# Patient Record
Sex: Male | Born: 1945 | Race: White | Hispanic: No | Smoking: Never smoker
Health system: Southern US, Community
[De-identification: ages and names within clinical notes are randomized; demographics above are authoritative.]

## PROBLEM LIST (undated history)

## (undated) DIAGNOSIS — R011 Cardiac murmur, unspecified: Secondary | ICD-10-CM

## (undated) DIAGNOSIS — N529 Male erectile dysfunction, unspecified: Secondary | ICD-10-CM

## (undated) DIAGNOSIS — B019 Varicella without complication: Secondary | ICD-10-CM

## (undated) DIAGNOSIS — B009 Herpesviral infection, unspecified: Secondary | ICD-10-CM

## (undated) DIAGNOSIS — E785 Hyperlipidemia, unspecified: Secondary | ICD-10-CM

## (undated) HISTORY — PX: SPLENECTOMY: SUR1306

## (undated) HISTORY — DX: Varicella without complication: B01.9

## (undated) HISTORY — DX: Cardiac murmur, unspecified: R01.1

## (undated) HISTORY — PX: COLONOSCOPY: SHX174

## (undated) HISTORY — DX: Male erectile dysfunction, unspecified: N52.9

## (undated) HISTORY — DX: Hyperlipidemia, unspecified: E78.5

## (undated) HISTORY — DX: Herpesviral infection, unspecified: B00.9

---
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
## 1996-08-17 LAB — HM COLONOSCOPY

## 2005-09-10 ENCOUNTER — Emergency Department (HOSPITAL_COMMUNITY): Admission: EM | Admit: 2005-09-10 | Discharge: 2005-09-10 | Payer: Self-pay | Admitting: Emergency Medicine

## 2009-09-12 ENCOUNTER — Ambulatory Visit (HOSPITAL_COMMUNITY): Admission: RE | Admit: 2009-09-12 | Discharge: 2009-09-12 | Payer: Self-pay | Admitting: Emergency Medicine

## 2009-09-25 ENCOUNTER — Ambulatory Visit (HOSPITAL_COMMUNITY): Admission: RE | Admit: 2009-09-25 | Discharge: 2009-09-25 | Payer: Self-pay | Admitting: Emergency Medicine

## 2010-11-05 LAB — CBC
HCT: 37.8 % — ABNORMAL LOW (ref 39.0–52.0)
Hemoglobin: 12.7 g/dL — ABNORMAL LOW (ref 13.0–17.0)
Platelets: 605 10*3/uL — ABNORMAL HIGH (ref 150–400)
RDW: 13.1 % (ref 11.5–15.5)
WBC: 8.4 10*3/uL (ref 4.0–10.5)

## 2011-10-28 ENCOUNTER — Ambulatory Visit: Payer: Self-pay

## 2011-12-24 ENCOUNTER — Other Ambulatory Visit: Payer: Self-pay | Admitting: Family

## 2011-12-24 ENCOUNTER — Encounter: Payer: Self-pay | Admitting: Family

## 2011-12-24 ENCOUNTER — Ambulatory Visit (INDEPENDENT_AMBULATORY_CARE_PROVIDER_SITE_OTHER): Payer: Medicare Other | Admitting: Family

## 2011-12-24 VITALS — BP 140/80 | HR 58 | Temp 98.4°F | Ht 64.0 in | Wt 175.0 lb

## 2011-12-24 DIAGNOSIS — Z13220 Encounter for screening for lipoid disorders: Secondary | ICD-10-CM

## 2011-12-24 DIAGNOSIS — Z0000 Encounter for general adult medical examination without abnormal findings: Secondary | ICD-10-CM

## 2011-12-24 DIAGNOSIS — Z136 Encounter for screening for cardiovascular disorders: Secondary | ICD-10-CM

## 2011-12-24 DIAGNOSIS — D492 Neoplasm of unspecified behavior of bone, soft tissue, and skin: Secondary | ICD-10-CM

## 2011-12-24 DIAGNOSIS — Z23 Encounter for immunization: Secondary | ICD-10-CM

## 2011-12-24 DIAGNOSIS — Z125 Encounter for screening for malignant neoplasm of prostate: Secondary | ICD-10-CM

## 2011-12-24 LAB — CBC WITH DIFFERENTIAL/PLATELET
Basophils Relative: 0.5 % (ref 0.0–3.0)
Eosinophils Absolute: 0.2 10*3/uL (ref 0.0–0.7)
HCT: 37.7 % — ABNORMAL LOW (ref 39.0–52.0)
Hemoglobin: 12.5 g/dL — ABNORMAL LOW (ref 13.0–17.0)
Lymphocytes Relative: 46.8 % — ABNORMAL HIGH (ref 12.0–46.0)
MCHC: 33.3 g/dL (ref 30.0–36.0)
MCV: 96.4 fl (ref 78.0–100.0)
Neutro Abs: 2.7 10*3/uL (ref 1.4–7.7)
RBC: 3.91 Mil/uL — ABNORMAL LOW (ref 4.22–5.81)
WBC: 7.1 10*3/uL (ref 4.5–10.5)

## 2011-12-24 LAB — POCT URINALYSIS DIPSTICK
Leukocytes, UA: NEGATIVE
pH, UA: 5.5

## 2011-12-24 LAB — LIPID PANEL
Total CHOL/HDL Ratio: 5
Triglycerides: 236 mg/dL — ABNORMAL HIGH (ref 0.0–149.0)

## 2011-12-24 LAB — BASIC METABOLIC PANEL
BUN: 18 mg/dL (ref 6–23)
Calcium: 9 mg/dL (ref 8.4–10.5)
GFR: 69.23 mL/min (ref >60.00)
Potassium: 4.4 mEq/L (ref 3.5–5.1)

## 2011-12-24 NOTE — Patient Instructions (Signed)
Exercise to Stay Healthy  Exercise helps you become and stay healthy.    EXERCISE IDEAS AND TIPS  Choose exercises that:   You enjoy.   Fit into your day.  You do not need to exercise really hard to be healthy. You can do exercises at a slow or medium level and stay healthy. You can:   Stretch before and after working out.   Try yoga, Pilates, or tai chi.   Lift weights.   Walk fast, swim, jog, run, climb stairs, bicycle, dance, or rollerskate.   Take aerobic classes.    Exercises that burn about 150 calories:     Running 1  miles in 15 minutes.   Playing volleyball for 45 to 60 minutes.   Washing and waxing a car for 45 to 60 minutes.   Playing touch football for 45 minutes.   Walking 1  miles in 35 minutes.   Pushing a stroller 1  miles in 30 minutes.   Playing basketball for 30 minutes.   Raking leaves for 30 minutes.   Bicycling 5 miles in 30 minutes.   Walking 2 miles in 30 minutes.   Dancing for 30 minutes.   Shoveling snow for 15 minutes.   Swimming laps for 20 minutes.   Walking up stairs for 15 minutes.   Bicycling 4 miles in 15 minutes.   Gardening for 30 to 45 minutes.   Jumping rope for 15 minutes.   Washing windows or floors for 45 to 60 minutes.  Document Released: 09/05/2010 Document Revised: 07/23/2011 Document Reviewed: 09/05/2010  Exit[employer] Patient Information 2012 [employer].

## 2011-12-24 NOTE — Progress Notes (Signed)
Subjective:    Patient ID: Clifford Gould, male    DOB: 08/31/1945, 66 y.o.   MRN: [id number]  HPI Patient presents for yearly preventative medicine examination. All immunizations and health maintenance protocols were reviewed with the patient and they are up to date with these protocols. Screening laboratory values were reviewed with the patient including screening of hyperlipidemia PSA renal function and hepatic function. There medications past medical history social history problem list and allergies were reviewed in detail. Goals were established with regard to weight loss exercise diet in compliance with medications   Review of Systems  Constitutional: Negative.   HENT: Negative.   Eyes: Negative.   Respiratory: Negative.   Cardiovascular: Negative.   Gastrointestinal: Negative.   Genitourinary: Negative.   Musculoskeletal: Negative.   Skin: Negative.   Neurological: Negative.   Hematological: Negative.   Psychiatric/Behavioral: Negative.    Past Medical History  Diagnosis Date  . Heart murmur   . Chicken pox     History   Social History  . Marital Status: Married    Spouse Name: N/A    Number of Children: N/A  . Years of Education: N/A   Occupational History  . Not on file.   Social History Main Topics  . Smoking status: Never Smoker   . Smokeless tobacco: Not on file  . Alcohol Use: 1.2 oz/week    2 Cans of beer per week  . Drug Use: No  . Sexually Active:    Other Topics Concern  . Not on file   Social History Narrative  . No narrative on file    Past Surgical History  Procedure Date  . Splenectomy     Family History  Problem Relation Age of Onset  . Cancer Mother     pancreatic  . Diabetes Father     No Known Allergies  No current outpatient prescriptions on file prior to visit.    BP 140/80  Pulse 58  Temp(Src) 98.4 F (36.9 C) (Oral)  Ht 5\' 4"  (1.626 m)  Wt 175 lb (79.379 kg)  BMI 30.04 kg/m2  SpO2 98%chart     Objective:     Physical Exam  Constitutional: He is oriented to person, place, and time. He appears well-developed and well-nourished.  HENT:  Head: Normocephalic and atraumatic.  Right Ear: External ear normal.  Left Ear: External ear normal.  Nose: Nose normal.  Mouth/Throat: Oropharynx is clear and moist.  Eyes: EOM are normal.  Neck: Neck supple. No thyromegaly present.  Cardiovascular: Normal rate, regular rhythm and normal heart sounds.   Pulmonary/Chest: Effort normal and breath sounds normal. He has no wheezes. He has no rales.  Abdominal: Soft. Bowel sounds are normal. There is no tenderness. There is no rebound and no guarding.  Genitourinary: Rectum normal, prostate normal and penis normal. Guaiac negative stool. No penile tenderness.  Musculoskeletal: Normal range of motion. He exhibits no edema and no tenderness.  Neurological: He is oriented to person, place, and time. He has normal reflexes. He displays normal reflexes. No cranial nerve deficit. He exhibits normal muscle tone. Coordination normal.  Skin: Skin is warm and dry. No rash noted. No erythema. No pallor.    EKG: WNL Tetanus and Pneumovax administered      Assessment & Plan:  Assessment: Welcome to Medicare CPX  Plan: Encouraged a healthy diet and exercise. Anticipatory guidance appropriate for age to include s/s of depression, handgun safety, elimination of area rugs and other fall risk. Colonoscopy screening  performed

## 2011-12-31 ENCOUNTER — Encounter: Payer: Self-pay | Admitting: Gastroenterology

## 2012-01-29 ENCOUNTER — Encounter: Payer: Self-pay | Admitting: Gastroenterology

## 2012-01-29 ENCOUNTER — Ambulatory Visit (AMBULATORY_SURGERY_CENTER): Payer: Medicare Other

## 2012-01-29 VITALS — Ht 64.0 in | Wt 169.1 lb

## 2012-01-29 DIAGNOSIS — Z1211 Encounter for screening for malignant neoplasm of colon: Secondary | ICD-10-CM

## 2012-01-29 MED ORDER — MOVIPREP 100 G PO SOLR: 1.0000 | Freq: Once | ORAL | Status: DC

## 2012-02-12 ENCOUNTER — Encounter: Payer: Medicare Other | Admitting: Gastroenterology

## 2012-07-12 ENCOUNTER — Ambulatory Visit (INDEPENDENT_AMBULATORY_CARE_PROVIDER_SITE_OTHER): Payer: Medicare Other | Admitting: Family

## 2012-07-12 ENCOUNTER — Encounter: Payer: Self-pay | Admitting: Family

## 2012-07-12 VITALS — BP 158/84 | HR 70 | Temp 98.3°F | Wt 172.0 lb

## 2012-07-12 DIAGNOSIS — J029 Acute pharyngitis, unspecified: Secondary | ICD-10-CM

## 2012-07-12 DIAGNOSIS — J069 Acute upper respiratory infection, unspecified: Secondary | ICD-10-CM

## 2012-07-12 LAB — POCT RAPID STREP A (OFFICE): Rapid Strep A Screen: NEGATIVE

## 2012-07-12 MED ORDER — METHYLPREDNISOLONE 4 MG PO KIT: PACK | ORAL | Status: AC

## 2012-07-12 NOTE — Progress Notes (Signed)
  Subjective:    Patient ID: Clifford Gould, male    DOB: March 19, 1946, 66 y.o..o.   MRN: [id number]  HPI  66 year old white male, nonsmoker is in with complaints of sore throat, ear pain, cough, sore throat, and congestion x 1 day. Has not been taking anything for relief.   Review of Systems  Constitutional: Negative.   HENT: Positive for congestion, sore throat, sneezing and postnasal drip.   Eyes: Negative.   Respiratory: Negative.   Cardiovascular: Negative.   Musculoskeletal: Negative.   Skin: Negative.   Neurological: Negative.   Hematological: Negative.   Psychiatric/Behavioral: Negative.    Past Medical History  Diagnosis Date  . Heart murmur   . Chicken pox     History   Social History  . Marital Status: Married    Spouse Name: N/A    Number of Children: N/A  . Years of Education: N/A   Occupational History  . Not on file.   Social History Main Topics  . Smoking status: Never Smoker   . Smokeless tobacco: Not on file  . Alcohol Use: 1.2 oz/week    2 Cans of beer per week  . Drug Use: No  . Sexually Active:    Other Topics Concern  . Not on file   Social History Narrative  . No narrative on file    Past Surgical History  Procedure Date  . Splenectomy     Family History  Problem Relation Age of Onset  . Cancer Mother     pancreatic  . Diabetes Father   . Colon cancer Neg Hx     No Known Allergies  Current Outpatient Prescriptions on File Prior to Visit  Medication Sig Dispense Refill  . diphenhydramine-acetaminophen (TYLENOL PM) 25-500 MG TABS Take 1 tablet by mouth at bedtime as needed.      . Naproxen Sodium (ALEVE) 220 MG CAPS Take 1 capsule by mouth 2 (two) times daily.      . Biotin 1000 MCG tablet Take 1,000 mcg by mouth 3 (three) times daily.      Marland Kitchen MOVIPREP 100 G SOLR Take 1 kit (100 g total) by mouth once.  1 kit  0    BP 158/84  Pulse 70  Temp 98.3 F (36.8 C) (Oral)  Wt 172 lb (78.019 kg)  SpO2 95%chart    Objective:   Physical Exam  Constitutional: He is oriented to person, place, and time. He appears well-developed and well-nourished.  HENT:  Right Ear: External ear normal.  Left Ear: External ear normal.  Nose: Nose normal.  Mouth/Throat: Oropharynx is clear and moist.  Neck: Normal range of motion. Neck supple.  Cardiovascular: Normal rate, regular rhythm and normal heart sounds.   Pulmonary/Chest: Effort normal and breath sounds normal.  Musculoskeletal: Normal range of motion.  Neurological: He is alert and oriented to person, place, and time.  Skin: Skin is warm and dry.  Psychiatric: He has a normal mood and affect.          Assessment & Plan:  Assessment: Upper Resp Infection, Cough, Pharyngitis  Plan: Medrol dospak as directed. Call the office if symptoms worsen or persist. Recheck as scheduled and sooner as needed.

## 2012-07-12 NOTE — Patient Instructions (Addendum)

## 2013-01-23 ENCOUNTER — Encounter: Payer: Self-pay | Admitting: Family

## 2013-01-23 ENCOUNTER — Ambulatory Visit (INDEPENDENT_AMBULATORY_CARE_PROVIDER_SITE_OTHER): Payer: Medicare Other | Admitting: Family

## 2013-01-23 VITALS — BP 136/82 | HR 64 | Ht 64.5 in | Wt 165.0 lb

## 2013-01-23 DIAGNOSIS — Z1211 Encounter for screening for malignant neoplasm of colon: Secondary | ICD-10-CM

## 2013-01-23 DIAGNOSIS — Z125 Encounter for screening for malignant neoplasm of prostate: Secondary | ICD-10-CM

## 2013-01-23 DIAGNOSIS — Z0000 Encounter for general adult medical examination without abnormal findings: Secondary | ICD-10-CM

## 2013-01-23 LAB — CBC WITH DIFFERENTIAL/PLATELET
Eosinophils Absolute: 0.1 10*3/uL (ref 0.0–0.7)
Eosinophils Relative: 1.3 % (ref 0.0–5.0)
MCV: 97.6 fl (ref 78.0–100.0)
Monocytes Absolute: 1.1 10*3/uL — ABNORMAL HIGH (ref 0.1–1.0)
Neutrophils Relative %: 55 % (ref 43.0–77.0)
Platelets: 366 10*3/uL (ref 150.0–400.0)
WBC: 9.9 10*3/uL (ref 4.5–10.5)

## 2013-01-23 LAB — POCT URINALYSIS DIPSTICK
Bilirubin, UA: NEGATIVE
Glucose, UA: NEGATIVE
Leukocytes, UA: NEGATIVE
Nitrite, UA: NEGATIVE

## 2013-01-23 LAB — HEPATIC FUNCTION PANEL
ALT: 23 U/L (ref 0–53)
Bilirubin, Direct: 0.1 mg/dL (ref 0.0–0.3)
Total Bilirubin: 0.5 mg/dL (ref 0.3–1.2)

## 2013-01-23 LAB — BASIC METABOLIC PANEL
BUN: 17 mg/dL (ref 6–23)
Calcium: 9.1 mg/dL (ref 8.4–10.5)
GFR: 77.65 mL/min (ref >60.00)
Glucose, Bld: 96 mg/dL (ref 70–99)
Sodium: 142 mEq/L (ref 135–145)

## 2013-01-23 LAB — PSA, MEDICARE: PSA: 1.31 ng/ml (ref 0.10–4.00)

## 2013-01-23 LAB — LIPID PANEL
HDL: 34.3 mg/dL — ABNORMAL LOW (ref >39.00)
Triglycerides: 130 mg/dL (ref 0.0–149.0)

## 2013-01-23 MED ORDER — CLOTRIMAZOLE-BETAMETHASONE 1-0.05 % EX CREA: TOPICAL_CREAM | Freq: Two times a day (BID) | CUTANEOUS | Status: DC

## 2013-01-23 NOTE — Patient Instructions (Signed)
Colonoscopy  A colonoscopy is an exam to evaluate your entire colon. In this exam, your colon is cleansed. A long fiberoptic tube is inserted through your rectum and into your colon. The fiberoptic scope (endoscope) is a long bundle of enclosed and very flexible fibers. These fibers transmit light to the area examined and send images from that area to your caregiver. Discomfort is usually minimal. You may be given a drug to help you sleep (sedative) during or prior to the procedure. This exam helps to detect lumps (tumors), polyps, inflammation, and areas of bleeding. Your caregiver may also take a small piece of tissue (biopsy) that will be examined under a microscope.  LET YOUR CAREGIVER KNOW ABOUT:   · Allergies to food or medicine.  · Medicines taken, including vitamins, herbs, eyedrops, over-the-counter medicines, and creams.  · Use of steroids (by mouth or creams).  · Previous problems with anesthetics or numbing medicines.  · History of bleeding problems or blood clots.  · Previous surgery.  · Other health problems, including diabetes and kidney problems.  · Possibility of pregnancy, if this applies.  BEFORE THE PROCEDURE   · A clear liquid diet may be required for 2 days before the exam.  · Ask your caregiver about changing or stopping your regular medications.  · Liquid injections (enemas) or laxatives may be required.  · A large amount of electrolyte solution may be given to you to drink over a short period of time. This solution is used to clean out your colon.  · You should be present 60 minutes prior to your procedure or as directed by your caregiver.  AFTER THE PROCEDURE   · If you received a sedative or pain relieving medication, you will need to arrange for someone to drive you home.  · Occasionally, there is a little blood passed with the first bowel movement. Do not be concerned.  FINDING OUT THE RESULTS OF YOUR TEST  Not all test results are available during your visit. If your test results are  not back during the visit, make an appointment with your caregiver to find out the results. Do not assume everything is normal if you have not heard from your caregiver or the medical facility. It is important for you to follow up on all of your test results.  HOME CARE INSTRUCTIONS   · It is not unusual to pass moderate amounts of gas and experience mild abdominal cramping following the procedure. This is due to air being used to inflate your colon during the exam. Walking or a warm pack on your belly (abdomen) may help.  · You may resume all normal meals and activities after sedatives and medicines have worn off.  · Only take over-the-counter or prescription medicines for pain, discomfort, or fever as directed by your caregiver. Do not use aspirin or blood thinners if a biopsy was taken. Consult your caregiver for medicine usage if biopsies were taken.  SEEK IMMEDIATE MEDICAL CARE IF:   · You have a fever.  · You pass large blood clots or fill a toilet with blood following the procedure. This may also occur 10 to 14 days following the procedure. This is more likely if a biopsy was taken.  · You develop abdominal pain that keeps getting worse and cannot be relieved with medicine.  Document Released: 07/31/2000 Document Revised: 10/26/2011 Document Reviewed: 03/15/2008  Exit[employer]® Patient Information ©2014 [employer].

## 2013-01-23 NOTE — Progress Notes (Signed)
Subjective:    Patient ID: Clifford Gould, male    DOB: [birth year], 67 y.o.   MRN: [id number]  HPI Patient presents for yearly preventative medicine examination. All immunizations and health maintenance protocols were reviewed with the patient and they are up to date with these protocols. Screening laboratory values were reviewed with the patient including screening of hyperlipidemia PSA renal function and hepatic function. There medications past medical history social history problem list and allergies were reviewed in detail. Goals were established with regard to weight loss exercise diet in compliance with medications   Review of Systems  Constitutional: Negative.   HENT: Negative.   Eyes: Negative.   Respiratory: Negative.   Cardiovascular: Negative.   Gastrointestinal: Negative.   Endocrine: Negative.   Genitourinary: Negative.   Musculoskeletal: Negative.   Skin: Negative.   Allergic/Immunologic: Negative.   Neurological: Negative.   Hematological: Negative.   Psychiatric/Behavioral: Negative.    Past Medical History  Diagnosis Date  . Heart murmur   . Chicken pox     History   Social History  . Marital Status: Married    Spouse Name: N/A    Number of Children: N/A  . Years of Education: N/A   Occupational History  . Not on file.   Social History Main Topics  . Smoking status: Never Smoker   . Smokeless tobacco: Not on file  . Alcohol Use: 1.2 oz/week    2 Cans of beer per week  . Drug Use: No  . Sexually Active:    Other Topics Concern  . Not on file   Social History Narrative  . No narrative on file    Past Surgical History  Procedure Laterality Date  . Splenectomy      Family History  Problem Relation Age of Onset  . Cancer Mother     pancreatic  . Diabetes Father   . Colon cancer Neg Hx     No Known Allergies  Current Outpatient Prescriptions on File Prior to Visit  Medication Sig Dispense Refill  . diphenhydramine-acetaminophen  (TYLENOL PM) 25-500 MG TABS Take 1 tablet by mouth at bedtime as needed.      . Naproxen Sodium (ALEVE) 220 MG CAPS Take 1 capsule by mouth 2 (two) times daily.      . Biotin 1000 MCG tablet Take 1,000 mcg by mouth 3 (three) times daily.      Marland Kitchen MOVIPREP 100 G SOLR Take 1 kit (100 g total) by mouth once.  1 kit  0   No current facility-administered medications on file prior to visit.    BP 136/82  Pulse 64  Ht 5' 4.5" (1.638 m)  Wt 165 lb (74.844 kg)  BMI 27.9 kg/m2  SpO2 98%chart    Objective:   Physical Exam  Constitutional: He is oriented to person, place, and time. He appears well-developed and well-nourished.  HENT:  Head: Normocephalic.  Right Ear: External ear normal.  Left Ear: External ear normal.  Nose: Nose normal.  Mouth/Throat: Oropharynx is clear and moist.  Eyes: Conjunctivae are normal. Pupils are equal, round, and reactive to light.  Neck: Normal range of motion. Neck supple. No thyromegaly present.  Cardiovascular: Normal rate, regular rhythm and normal heart sounds.   Pulmonary/Chest: Effort normal and breath sounds normal.  Abdominal: Soft. Bowel sounds are normal. He exhibits no distension. There is no tenderness. There is no rebound.  Genitourinary: Rectum normal, prostate normal and penis normal. Guaiac negative stool. No penile tenderness.  Musculoskeletal:  Normal range of motion. He exhibits no edema and no tenderness.  Neurological: He is alert and oriented to person, place, and time. He has normal reflexes. He displays normal reflexes. No cranial nerve deficit. Coordination normal.  Skin: Skin is warm and dry.  Psychiatric: He has a normal mood and affect.          Assessment & Plan:  Assessment:  1. CPX  Plan: Encouraged a healthy diet, exercise, colonoscopy. Labs sent to include lipids, cbc, tsh, psa, UA. Recheck in 1 year and sooner as needed.

## 2013-01-24 ENCOUNTER — Encounter: Payer: Self-pay | Admitting: Gastroenterology

## 2013-03-14 ENCOUNTER — Ambulatory Visit (AMBULATORY_SURGERY_CENTER): Payer: Medicare Other | Admitting: *Deleted

## 2013-03-14 VITALS — Ht 64.5 in | Wt 174.0 lb

## 2013-03-14 DIAGNOSIS — Z1211 Encounter for screening for malignant neoplasm of colon: Secondary | ICD-10-CM

## 2013-03-14 MED ORDER — NA SULFATE-K SULFATE-MG SULF 17.5-3.13-1.6 GM/177ML PO SOLN: 1.0000 | Freq: Once | ORAL | Status: DC

## 2013-03-14 NOTE — Progress Notes (Signed)
No egg or soy allergy. ewm No home 02 use. ewm No problems with past sedation. ewm Pt had a colonoscopy 15 + years ago in [facility] [place]. Normal exam per pt. ewm

## 2013-03-15 ENCOUNTER — Encounter: Payer: Self-pay | Admitting: Gastroenterology

## 2013-03-22 ENCOUNTER — Other Ambulatory Visit: Payer: Self-pay

## 2013-03-28 ENCOUNTER — Encounter: Payer: Medicare Other | Admitting: Gastroenterology

## 2013-04-25 ENCOUNTER — Encounter: Payer: Self-pay | Admitting: Gastroenterology

## 2013-04-25 ENCOUNTER — Ambulatory Visit (AMBULATORY_SURGERY_CENTER): Payer: Medicare Other | Admitting: Gastroenterology

## 2013-04-25 VITALS — BP 122/72 | HR 55 | Temp 97.7°F | Resp 14 | Ht 64.5 in | Wt 174.0 lb

## 2013-04-25 DIAGNOSIS — Z1211 Encounter for screening for malignant neoplasm of colon: Secondary | ICD-10-CM

## 2013-04-25 MED ORDER — SODIUM CHLORIDE 0.9 % IV SOLN: 500.0000 mL | INTRAVENOUS | Status: DC

## 2013-04-25 NOTE — Op Note (Signed)
[facility] [address] [place], [address]   COLONOSCOPY PROCEDURE REPORT  PATIENT: Clifford, Gould  MR#: [id number] BIRTHDATE: May 04, 1946 , 67  yrs. old GENDER: Male ENDOSCOPIST: Louis Meckel, MD REFERRED XB:[id number] Orvan Falconer, FNP-BC PROCEDURE DATE:  04/25/2013 PROCEDURE:   Colonoscopy, diagnostic First Screening Colonoscopy - Avg.  risk and is 50 yrs.  old or older Yes.  Prior Negative Screening - Now for repeat screening. N/A  History of Adenoma - Now for follow-up colonoscopy & has been > or = to 3 yrs.  N/A ASA CLASS:   Class I INDICATIONS:average risk screening. MEDICATIONS: MAC sedation, administered by CRNA and propofol (Diprivan) 200mg  IV  DESCRIPTION OF PROCEDURE:   After the risks benefits and alternatives of the procedure were thoroughly explained, informed consent was obtained.  A digital rectal exam revealed no abnormalities of the rectum.   The LB CF-H180AL Loaner [id number] endoscope was introduced through the anus and advanced to the cecum, which was identified by both the appendix and ileocecal valve. No adverse events experienced.   The quality of the prep was excellent using Suprep  The instrument was then slowly withdrawn as the colon was fully examined.      COLON FINDINGS: A normal appearing cecum, ileocecal valve, and appendiceal orifice were identified.  The ascending, hepatic flexure, transverse, splenic flexure, descending, sigmoid colon and rectum appeared unremarkable.  No polyps or cancers were seen. Retroflexed views revealed no abnormalities. The time to cecum=2 minutes 11 seconds.  Withdrawal time=9 minutes 32 seconds.  The scope was withdrawn and the procedure completed. COMPLICATIONS: There were no complications.  ENDOSCOPIC IMPRESSION: Normal colon  RECOMMENDATIONS: Continue current colorectal screening recommendations for "routine risk" patients with a repeat colonoscopy in 10 years.   eSigned:  Louis Meckel, MD 04/25/2013 3:05 PM   cc:

## 2013-04-25 NOTE — Patient Instructions (Addendum)

## 2013-04-25 NOTE — Progress Notes (Signed)
Patient did not experience any of the following events: a burn prior to discharge; a fall within the facility; wrong site/side/patient/procedure/implant event; or a hospital transfer or hospital admission upon discharge from the facility. (G8907) Patient did not have preoperative order for IV antibiotic SSI prophylaxis. (G8918)  

## 2013-04-26 ENCOUNTER — Telehealth: Payer: Self-pay | Admitting: *Deleted

## 2013-04-26 NOTE — Telephone Encounter (Signed)
  Follow up Call-  Call back number 04/25/2013  Post procedure Call Back phone  # [phone]  Permission to leave phone message Yes     Patient questions:  Do you have a fever, pain , or abdominal swelling? no Pain Score  0 *  Have you tolerated food without any problems? yes  Have you been able to return to your normal activities? yes  Do you have any questions about your discharge instructions: Diet   no Medications  no Follow up visit  no  Do you have questions or concerns about your Care? no  Actions: * If pain score is 4 or above: No action needed, pain <4.

## 2013-06-08 ENCOUNTER — Ambulatory Visit (INDEPENDENT_AMBULATORY_CARE_PROVIDER_SITE_OTHER): Payer: Medicare Other | Admitting: Family Medicine

## 2013-06-08 VITALS — BP 132/80 | HR 61 | Temp 98.1°F | Resp 16 | Ht 64.5 in | Wt 176.4 lb

## 2013-06-08 DIAGNOSIS — J069 Acute upper respiratory infection, unspecified: Secondary | ICD-10-CM

## 2013-06-08 MED ORDER — PSEUDOEPHEDRINE HCL ER 120 MG PO TB12: 120.0000 mg | ORAL_TABLET | Freq: Two times a day (BID) | ORAL | Status: DC

## 2013-06-08 MED ORDER — IPRATROPIUM BROMIDE 0.03 % NA SOLN: 2.0000 | Freq: Four times a day (QID) | NASAL | Status: DC

## 2013-06-08 MED ORDER — GUAIFENESIN-CODEINE 100-10 MG/5ML PO SOLN: 5.0000 mL | Freq: Three times a day (TID) | ORAL | Status: DC | PRN

## 2013-06-08 MED ORDER — FLUTICASONE PROPIONATE 50 MCG/ACT NA SUSP: 2.0000 | Freq: Every day | NASAL | Status: DC

## 2013-06-08 MED ORDER — MUCINEX DM MAXIMUM STRENGTH 60-1200 MG PO TB12: 1.0000 | ORAL_TABLET | Freq: Two times a day (BID) | ORAL | Status: DC

## 2013-06-08 NOTE — Patient Instructions (Signed)
Hot showers or breathing in steam may help loosen the congestion.  Using a netti pot or sinus rinse is also likely to help you feel better and keep this from progressing.  Use the atrovent nasal spray as needed throughout the day and use the fluticasone nasal spray every night before bed for at least 2 weeks.  I recommend augmenting with 12 hr sudafed (behind the counter) and generic mucinex to help you move out the congestion.  If no improvement or you are getting worse, come back as you might need a course of steroids but hopefully with all of the above, you can avoid it.  You did get your pneumonia vaccine in May 2013 so you are done with that.  Get your flu shot this year at work when you are feeling better and whenever you have time you should definitely get your shingles vaccine (or zostavax) at the pharmacy.  Upper Respiratory Infection, Adult An upper respiratory infection (URI) is also known as the common cold. It is often caused by a type of germ (virus). Colds are easily spread (contagious). You can pass it to others by kissing, coughing, sneezing, or drinking out of the same glass. Usually, you get better in 1 or 2 weeks.  HOME CARE   Only take medicine as told by your doctor.  Use a warm mist humidifier or breathe in steam from a hot shower.  Drink enough water and fluids to keep your pee (urine) clear or pale yellow.  Get plenty of rest.  Return to work when your temperature is back to normal or as told by your doctor. You may use a face mask and wash your hands to stop your cold from spreading. GET HELP RIGHT AWAY IF:   After the first few days, you feel you are getting worse.  You have questions about your medicine.  You have chills, shortness of breath, or brown or red spit (mucus).  You have yellow or brown snot (nasal discharge) or pain in the face, especially when you bend forward.  You have a fever, puffy (swollen) neck, pain when you swallow, or white spots in the  back of your throat.  You have a bad headache, ear pain, sinus pain, or chest pain.  You have a high-pitched whistling sound when you breathe in and out (wheezing).  You have a lasting cough or cough up blood.  You have sore muscles or a stiff neck. MAKE SURE YOU:   Understand these instructions.  Will watch your condition.  Will get help right away if you are not doing well or get worse. Document Released: 01/20/2008 Document Revised: 10/26/2011 Document Reviewed: 12/08/2010 [facility] Patient Information 2014 [place], [place].

## 2013-06-22 ENCOUNTER — Other Ambulatory Visit: Payer: Self-pay

## 2013-09-17 NOTE — Progress Notes (Signed)
Subjective:    Patient ID: Clifford Gould, male    DOB: 01/12/1946, 68 y.o.   MRN: [id number]  This chart was scribed for Delman Cheadle, MD by Rolanda Lundborg, ED Scribe. This patient was seen in room 1 and the patient's care was started at 10:03 AM.  Chief Complaint  Patient presents with  . Nasal Congestion    Since sunday  . Sneezing   HPI HPI Comments: Clifford Gould is a 68 y.o. male who presents to [facility] complaining of constant nasal congestion and intermittent sneezing with green phlegm since four days ago. He is also coughing up the green phlegm. He also reports sinus pressure, clogged ears, and dry throat. He is eating and drinking normally and BMs are normal. He is sleeping well except for waking up with dry mouth. He has been using OTC alka seltzer with good relief. If he doesn't take it on a regular basis he gets pretty fatigued. He denies jaw pain, fevers, chills, trouble swallowing, nausea, vomiting.  Past Medical History  Diagnosis Date  . Heart murmur   . Chicken pox   . Hyperlipidemia     last year 2013 elevated now normal   Current Outpatient Prescriptions on File Prior to Visit  Medication Sig Dispense Refill  . acetaminophen (TYLENOL) 500 MG tablet Take 500 mg by mouth every 6 (six) hours as needed for pain.      Marland Kitchen aspirin 325 MG tablet Take 325 mg by mouth daily.      . clotrimazole-betamethasone (LOTRISONE) cream Apply topically 2 (two) times daily.  30 g  0  . diphenhydramine-acetaminophen (TYLENOL PM) 25-500 MG TABS Take 1 tablet by mouth at bedtime as needed.      Marland Kitchen L-Lysine HCl 500 MG CAPS Take 1 capsule by mouth daily.      . Multiple Vitamins-Minerals (CENTRUM SILVER ADULT 50+ PO) Take 1 capsule by mouth daily.      . Potassium Gluconate 595 MG CAPS Take 1 capsule by mouth daily.      Marland Kitchen glucosamine-chondroitin 500-400 MG tablet Take 1 tablet by mouth 3 (three) times daily.      . Naproxen Sodium (ALEVE) 220 MG CAPS Take 1 capsule by mouth 2 (two) times daily.        No current facility-administered medications on file prior to visit.   No Known Allergies   Review of Systems  Constitutional: Negative for fever and chills.  HENT: Positive for congestion, ear pain, sinus pressure, sneezing and sore throat. Negative for rhinorrhea and trouble swallowing.   Eyes: Negative for visual disturbance.  Respiratory: Positive for cough. Negative for shortness of breath.   Cardiovascular: Negative for chest pain and leg swelling.  Gastrointestinal: Negative for nausea, vomiting, abdominal pain and diarrhea.  Genitourinary: Negative for dysuria.  Musculoskeletal: Negative for back pain and neck pain.  Skin: Negative for rash.  Neurological: Negative for headaches.  Hematological: Does not bruise/bleed easily.  Psychiatric/Behavioral: Negative for confusion.  All other systems reviewed and are negative.      BP 132/80  Pulse 61  Temp(Src) 98.1 F (36.7 C) (Oral)  Resp 16  Ht 5' 4.5" (1.638 m)  Wt 176 lb 6.4 oz (80.015 kg)  BMI 29.82 kg/m2  SpO2 96% Objective:   Physical Exam  Nursing note and vitals reviewed. Constitutional: He is oriented to person, place, and time. He appears well-developed and well-nourished. No distress.  HENT:  Head: Normocephalic and atraumatic.  Left Ear: A middle ear effusion is  present.  Nose: No mucosal edema or rhinorrhea.  Mouth/Throat: Posterior oropharyngeal erythema present. No oropharyngeal exudate or posterior oropharyngeal edema.  Eyes: EOM are normal.  Neck: Neck supple. No tracheal deviation present. No mass and no thyromegaly present.  Cardiovascular: Normal rate.   Pulmonary/Chest: Effort normal and breath sounds normal. No respiratory distress. He has no decreased breath sounds. He has no wheezes. He has no rhonchi. He has no rales.  Musculoskeletal: Normal range of motion.  Lymphadenopathy:       Head (right side): Tonsillar adenopathy present.       Head (left side): Tonsillar adenopathy present.     He has no cervical adenopathy.       Right cervical: No superficial cervical, no deep cervical and no posterior cervical adenopathy present.      Left cervical: No superficial cervical, no deep cervical and no posterior cervical adenopathy present.  Neurological: He is alert and oriented to person, place, and time.  Skin: Skin is warm and dry.  Psychiatric: He has a normal mood and affect. His behavior is normal.      Assessment & Plan:  10:12 AM- Discussed treatment plan with pt which includes discharge home with sudafed, flonase, and atrovent and to come back if symptoms worsen. Pt agrees to plan. URI, acute  Meds ordered this encounter  Medications  . guaiFENesin-codeine 100-10 MG/5ML syrup    Sig: Take 5 mLs by mouth 3 (three) times daily as needed for cough or congestion.    Dispense:  120 mL    Refill:  0  . ipratropium (ATROVENT) 0.03 % nasal spray    Sig: Place 2 sprays into the nose 4 (four) times daily.    Dispense:  30 mL    Refill:  5  . fluticasone (FLONASE) 50 MCG/ACT nasal spray    Sig: Place 2 sprays into the nose at bedtime.    Dispense:  16 g    Refill:  6  . Dextromethorphan-Guaifenesin (MUCINEX DM MAXIMUM STRENGTH) 60-1200 MG TB12    Sig: Take 1 tablet by mouth every 12 (twelve) hours.    Dispense:  60 each    Refill:  1  . pseudoephedrine (SUDAFED 12 HOUR) 120 MG 12 hr tablet    Sig: Take 1 tablet (120 mg total) by mouth every 12 (twelve) hours.    Dispense:  30 tablet    Refill:  0    I personally performed the services described in this documentation, which was scribed in my presence. The recorded information has been reviewed and considered, and addended by me as needed.  Delman Cheadle, MD MPH

## 2013-09-29 ENCOUNTER — Encounter: Payer: Self-pay | Admitting: Family

## 2013-09-29 ENCOUNTER — Ambulatory Visit (INDEPENDENT_AMBULATORY_CARE_PROVIDER_SITE_OTHER): Payer: Medicare Other | Admitting: Family

## 2013-09-29 VITALS — BP 120/80 | HR 69 | Wt 173.0 lb

## 2013-09-29 DIAGNOSIS — N529 Male erectile dysfunction, unspecified: Secondary | ICD-10-CM

## 2013-09-29 DIAGNOSIS — R5383 Other fatigue: Secondary | ICD-10-CM

## 2013-09-29 DIAGNOSIS — Z0100 Encounter for examination of eyes and vision without abnormal findings: Secondary | ICD-10-CM

## 2013-09-29 DIAGNOSIS — R3915 Urgency of urination: Secondary | ICD-10-CM

## 2013-09-29 DIAGNOSIS — R5381 Other malaise: Secondary | ICD-10-CM

## 2013-09-29 LAB — BASIC METABOLIC PANEL
BUN: 25 mg/dL — ABNORMAL HIGH (ref 6–23)
CO2: 24 mEq/L (ref 19–32)
Calcium: 9.8 mg/dL (ref 8.4–10.5)
Chloride: 104 mEq/L (ref 96–112)
Creatinine, Ser: 1.3 mg/dL (ref 0.4–1.5)
GFR: 58.57 mL/min — AB (ref >60.00)
GLUCOSE: 87 mg/dL (ref 70–99)
POTASSIUM: 4.1 meq/L (ref 3.5–5.1)
Sodium: 138 mEq/L (ref 135–145)

## 2013-09-29 LAB — POCT URINALYSIS DIPSTICK
Bilirubin, UA: NEGATIVE
GLUCOSE UA: NEGATIVE
Ketones, UA: NEGATIVE
Leukocytes, UA: NEGATIVE
NITRITE UA: NEGATIVE
Protein, UA: NEGATIVE
RBC UA: NEGATIVE
Spec Grav, UA: 1.02
Urobilinogen, UA: 0.2
pH, UA: 5.5

## 2013-09-29 LAB — TESTOSTERONE: TESTOSTERONE: 283.29 ng/dL — AB (ref 350.00–890.00)

## 2013-09-29 LAB — TSH: TSH: 2.36 u[IU]/mL (ref 0.35–5.50)

## 2013-09-29 NOTE — Progress Notes (Signed)
Pre visit review using our clinic review tool, if applicable. No additional management support is needed unless otherwise documented below in the visit note. 

## 2013-09-29 NOTE — Patient Instructions (Signed)
Erectile Dysfunction  Erectile dysfunction is the inability to get or sustain a good enough erection to have sexual intercourse. Erectile dysfunction may involve:   Inability to get an erection.   Lack of enough hardness to allow penetration.   Loss of the erection before sex is finished.   Premature ejaculation.  CAUSES   Certain drugs, such as:   Pain relievers.   Antihistamines.   Antidepressants.   Blood pressure medicines.   Water pills (diuretics).   Ulcer medicines.   Muscle relaxants.   Illegal drugs.   Excessive drinking.   Psychological causes, such as:   Anxiety.   Depression.   Sadness.   Exhaustion.   Performance fear.   Stress.   Physical causes, such as:   Artery problems. This may include diabetes, smoking, liver disease, or atherosclerosis.   High blood pressure.   Hormonal problems, such as low testosterone.   Obesity.   Nerve problems. This may include back or pelvic injuries, diabetes mellitus, multiple sclerosis, or Parkinson disease.  SYMPTOMS   Inability to get an erection.   Lack of enough hardness to allow penetration.   Loss of the erection before sex is finished.   Premature ejaculation.   Normal erections at some times, but with frequent unsatisfactory episodes.   Orgasms that are not satisfactory in sensation or frequency.   Low sexual satisfaction in either partner because of erection problems.   A curved penis occurring with erection. The curve may cause pain or may be too curved to allow for intercourse.   Never having nighttime erections.  DIAGNOSIS  Your caregiver can often diagnose this condition by:   Performing a physical exam to find other diseases or specific problems with the penis.   Asking you detailed questions about the problem.   Performing blood tests to check for diabetes mellitus or to measure hormone levels.   Performing urine tests to find other underlying health conditions.   Performing an ultrasound exam to check for  scarring.   Performing a test to check blood flow to the penis.   Doing a sleep study at home to measure nighttime erections.  TREATMENT    You may be prescribed medicines by mouth.   You may be given medicine injections into the penis.   You may be prescribed a vacuum pump with a ring.   Penile implant surgery may be performed. You may receive:   An inflatable implant.   A semirigid implant.   Blood vessel surgery may be performed.  HOME CARE INSTRUCTIONS   If you are prescribed oral medicine, you should take the medicine as prescribed. Do not increase the dosage without first discussing it with your physician.   If you are using self-injections, be careful to avoid any veins that are on the surface of the penis. Apply pressure to the injection site for 5 minutes.   If you are using a vacuum pump, make sure you have read the instructions before using it. Discuss any questions with your physician before taking the pump home.  SEEK MEDICAL CARE IF:   You experience pain that is not responsive to the pain medicine you have been prescribed.   You experience nausea or vomiting.  SEEK IMMEDIATE MEDICAL CARE IF:    When taking oral or injectable medications, you experience an erection that lasts longer than 4 hours. If your physician is unavailable, go to the nearest emergency room for evaluation. An erection that lasts much longer than 4 hours can   result in permanent damage to your penis.   You have pain that is severe.   You develop redness, severe pain, or severe swelling of your penis.   You have redness spreading up into your groin or lower abdomen.   You are unable to pass your urine.  Document Released: 07/31/2000 Document Revised: 04/05/2013 Document Reviewed: 01/05/2013  [employer] Patient Information 2014 [employer].

## 2013-09-29 NOTE — Progress Notes (Signed)
Subjective:    Patient ID: Clifford Gould, male    DOB: Feb 06, 1946, 68 y.o.   MRN: [id number]  HPI 68 year old white male, nonsmoker is in today with concerns of erectile dysfunction over the last 2 months. Reports difficulty achieving and maintaining an erection. Also reports decreased urinary flow and increased urgency. Has never had any issues prior to now. Reports increased fatigue and has to take a daily nap of about 15-20 minutes prior to going into work the night shift. Reports having occasional low back pain and numbness going down the right leg. Denies any injury. The pain is worse when going up stairs.   Review of Systems  Constitutional: Positive for fatigue.  Respiratory: Negative.   Cardiovascular: Negative.   Gastrointestinal: Negative.   Genitourinary: Positive for urgency and frequency.       Erectile dysfunction  Musculoskeletal: Positive for arthralgias and back pain.  Skin: Negative.   Neurological: Negative.   Psychiatric/Behavioral: Negative.    Past Medical History  Diagnosis Date  . Heart murmur   . Chicken pox   . Hyperlipidemia     last year 2013 elevated now normal    History   Social History  . Marital Status: Married    Spouse Name: N/A    Number of Children: N/A  . Years of Education: N/A   Occupational History  . Not on file.   Social History Main Topics  . Smoking status: Never Smoker   . Smokeless tobacco: Never Used  . Alcohol Use: 1.2 oz/week    2 Cans of beer per week     Comment: occ beer  . Drug Use: No  . Sexual Activity: Not on file   Other Topics Concern  . Not on file   Social History Narrative  . No narrative on file    Past Surgical History  Procedure Laterality Date  . Splenectomy    . Colonoscopy      15+yrs ago- normal per pt.    Family History  Problem Relation Age of Onset  . Pancreatic cancer Mother     pancreatic  . Diabetes Father   . Colon cancer Neg Hx     No Known Allergies  Current  Outpatient Prescriptions on File Prior to Visit  Medication Sig Dispense Refill  . diphenhydramine-acetaminophen (TYLENOL PM) 25-500 MG TABS Take 1 tablet by mouth at bedtime as needed.      . Potassium Gluconate 595 MG CAPS Take 1 capsule by mouth daily.      Marland Kitchen acetaminophen (TYLENOL) 500 MG tablet Take 500 mg by mouth every 6 (six) hours as needed for pain.      Marland Kitchen aspirin 325 MG tablet Take 325 mg by mouth daily.      . clotrimazole-betamethasone (LOTRISONE) cream Apply topically 2 (two) times daily.  30 g  0  . Dextromethorphan-Guaifenesin (MUCINEX DM MAXIMUM STRENGTH) 60-1200 MG TB12 Take 1 tablet by mouth every 12 (twelve) hours.  60 each  1  . fluticasone (FLONASE) 50 MCG/ACT nasal spray Place 2 sprays into the nose at bedtime.  16 g  6  . glucosamine-chondroitin 500-400 MG tablet Take 1 tablet by mouth 3 (three) times daily.      Marland Kitchen guaiFENesin-codeine 100-10 MG/5ML syrup Take 5 mLs by mouth 3 (three) times daily as needed for cough or congestion.  120 mL  0  . ipratropium (ATROVENT) 0.03 % nasal spray Place 2 sprays into the nose 4 (four) times daily.  30 mL  5  . L-Lysine HCl 500 MG CAPS Take 1 capsule by mouth daily.      . Multiple Vitamins-Minerals (CENTRUM SILVER ADULT 50+ PO) Take 1 capsule by mouth daily.      . Naproxen Sodium (ALEVE) 220 MG CAPS Take 1 capsule by mouth 2 (two) times daily.      . pseudoephedrine (SUDAFED 12 HOUR) 120 MG 12 hr tablet Take 1 tablet (120 mg total) by mouth every 12 (twelve) hours.  30 tablet  0   No current facility-administered medications on file prior to visit.    BP 120/80  Pulse 69  Wt 173 lb (78.472 kg)chart    Objective:   Physical Exam  Constitutional: He is oriented to person, place, and time. He appears well-developed and well-nourished.  Neck: Normal range of motion. Neck supple.  Cardiovascular: Normal rate, regular rhythm and normal heart sounds.   Pulmonary/Chest: Effort normal and breath sounds normal.  Abdominal: Soft. Bowel  sounds are normal.  Musculoskeletal: Normal range of motion.  Neurological: He is alert and oriented to person, place, and time.  Skin: Skin is warm and dry.  Psychiatric: He has a normal mood and affect.          Assessment & Plan:  Clifford Gould was seen today for hip pain, erectile dysfunction and knee pain.  Diagnoses and associated orders for this visit:  Erectile dysfunction - Basic Metabolic Panel - POC Urinalysis Dipstick - Testosterone - TSH  Urinary urgency - Basic Metabolic Panel - POC Urinalysis Dipstick - Testosterone - TSH  Other malaise and fatigue - Basic Metabolic Panel - POC Urinalysis Dipstick - Testosterone - TSH   Labs sent, will notify patient pending results and discuss further treatment plan.

## 2013-10-02 ENCOUNTER — Other Ambulatory Visit: Payer: Self-pay | Admitting: Family

## 2013-10-02 DIAGNOSIS — E291 Testicular hypofunction: Secondary | ICD-10-CM

## 2014-01-29 ENCOUNTER — Ambulatory Visit (INDEPENDENT_AMBULATORY_CARE_PROVIDER_SITE_OTHER): Payer: Medicare Other | Admitting: Family Medicine

## 2014-01-29 ENCOUNTER — Encounter: Payer: Self-pay | Admitting: Family Medicine

## 2014-01-29 ENCOUNTER — Ambulatory Visit (INDEPENDENT_AMBULATORY_CARE_PROVIDER_SITE_OTHER): Payer: Medicare Other

## 2014-01-29 VITALS — BP 134/62 | HR 52 | Temp 97.9°F | Resp 16 | Ht 64.0 in | Wt 171.0 lb

## 2014-01-29 DIAGNOSIS — M79671 Pain in right foot: Secondary | ICD-10-CM

## 2014-01-29 DIAGNOSIS — M79604 Pain in right leg: Secondary | ICD-10-CM

## 2014-01-29 DIAGNOSIS — Y92009 Unspecified place in unspecified non-institutional (private) residence as the place of occurrence of the external cause: Secondary | ICD-10-CM

## 2014-01-29 DIAGNOSIS — M79609 Pain in unspecified limb: Secondary | ICD-10-CM

## 2014-01-29 DIAGNOSIS — IMO0002 Reserved for concepts with insufficient information to code with codable children: Secondary | ICD-10-CM

## 2014-01-29 DIAGNOSIS — W19XXXA Unspecified fall, initial encounter: Secondary | ICD-10-CM

## 2014-01-29 DIAGNOSIS — S8010XA Contusion of unspecified lower leg, initial encounter: Secondary | ICD-10-CM

## 2014-01-29 DIAGNOSIS — S80811A Abrasion, right lower leg, initial encounter: Secondary | ICD-10-CM

## 2014-01-29 MED ORDER — HYDROCODONE-ACETAMINOPHEN 5-325 MG PO TABS: 1.0000 | ORAL_TABLET | Freq: Four times a day (QID) | ORAL | Status: DC | PRN

## 2014-01-29 NOTE — Patient Instructions (Signed)
Use ice 15 or 20 minutes every few hours  Elevate  Return if worse or not improving  Ibuprofen or acetaminophen or naproxen as needed for pain  Hydrocodone one every 4-6 hours as needed for more severe pain

## 2014-01-29 NOTE — Progress Notes (Signed)
Subjective: 68 year old man who was doing some work around his house and put on a ladder on a table to try and get up onto a roof. When he was about 8 feet things gave way and he flipped onto the ground. It was probably a brick patio he landed on, though he is not sure exactly how he landed. He lay there for a brief period and got himself up. He felt transiently dizzy but that cleared up after about 5 minutes. He got himself up and got to a chair. Later he lay down on the couch with ice on his leg. When he awakened from a little nap he had numbness in his right foot. He's having a good deal of pain in his lower legs. He has abrasions on both legs. He walked himself in here. He has daily headaches when he gets up in the morning. No specific problems with his legs.  Generally he is a healthy man. Retired Quarry manager at [employer].  Objective: Alert gentleman who is in some distress just from the pain. He is hurting in his legs and especially the right ankle and foot. He has abrasions on the lateral aspect of the right leg just below the knee and the largest abrasions on the lower third of the leg. The right leg is a little swollen compared to the left. He is diffusely tender from mid shin down, especially on the lateral aspect of the lower leg and on the top of his foot. He is able to move his toes. Pulses intact. The left foot has lateral abrasions on it and does not seem nearly as tender it down in the foot though he has some tenderness in the calf on the left. color is good.  Assessment: Pain both legs from falls, especially right  Plan: X-ray right lower extremity and foot  [facility] reading (PRIMARY) by  Dr. Linna Darner No fractures noted  Symptomatic treatment.

## 2014-02-02 ENCOUNTER — Ambulatory Visit (INDEPENDENT_AMBULATORY_CARE_PROVIDER_SITE_OTHER): Payer: Medicare Other | Admitting: Family Medicine

## 2014-02-02 VITALS — BP 140/86 | HR 53 | Temp 98.5°F | Resp 20 | Ht 64.0 in | Wt 178.0 lb

## 2014-02-02 DIAGNOSIS — M79609 Pain in unspecified limb: Secondary | ICD-10-CM

## 2014-02-02 DIAGNOSIS — M79671 Pain in right foot: Secondary | ICD-10-CM

## 2014-02-02 DIAGNOSIS — S8011XD Contusion of right lower leg, subsequent encounter: Secondary | ICD-10-CM

## 2014-02-02 DIAGNOSIS — S8010XA Contusion of unspecified lower leg, initial encounter: Secondary | ICD-10-CM

## 2014-02-02 DIAGNOSIS — Z5189 Encounter for other specified aftercare: Secondary | ICD-10-CM

## 2014-02-02 DIAGNOSIS — M79604 Pain in right leg: Secondary | ICD-10-CM

## 2014-02-02 MED ORDER — HYDROCODONE-ACETAMINOPHEN 5-325 MG PO TABS: 1.0000 | ORAL_TABLET | Freq: Four times a day (QID) | ORAL | Status: DC | PRN

## 2014-02-02 NOTE — Patient Instructions (Signed)
Take Aleve 2 tablets twice daily as needed for pain and inflammation  When sitting around to elevate the leg. Walking is probably good for it.  Use the hydrocodone every 6 hours if worse pain. Can take it at 4 hours if needed at nighttime for worse pain.  Return if needed

## 2014-02-02 NOTE — Progress Notes (Signed)
Subjective: Patient is doing better but it still hurts in the right leg foot. He says he wakes up early morning hours of the night hurting. He tried taking some of his wife's gabapentin and that seemed to give him relief. He has taken some ibuprofen also. He developed more bruising in the right pocket area of his thigh from where he had a cell phone. He has a pins and needles sensation in his right foot where it is swollen and turned bluish. The whole leg however is not as swollen as it was yesterday.  Objective: Large ecchymosis right lateral thigh. Mildly bluish discoloration of the lower leg especially the right foot. The multiple small abrasions or healing nicely.  Assessment: Right leg contusion and abrasions and ecchymosis and pain  Plan: See instructions. Represcribed the hydrocodone/APAP

## 2014-02-22 ENCOUNTER — Ambulatory Visit (INDEPENDENT_AMBULATORY_CARE_PROVIDER_SITE_OTHER): Payer: Medicare Other | Admitting: Family Medicine

## 2014-02-22 VITALS — BP 128/70 | HR 52 | Temp 98.7°F | Resp 16 | Ht 65.0 in | Wt 177.6 lb

## 2014-02-22 DIAGNOSIS — L03115 Cellulitis of right lower limb: Secondary | ICD-10-CM

## 2014-02-22 DIAGNOSIS — L02419 Cutaneous abscess of limb, unspecified: Secondary | ICD-10-CM

## 2014-02-22 DIAGNOSIS — L03119 Cellulitis of unspecified part of limb: Secondary | ICD-10-CM

## 2014-02-22 MED ORDER — SILVER SULFADIAZINE 1 % EX CREA: 1.0000 "application " | TOPICAL_CREAM | Freq: Every day | CUTANEOUS | Status: DC

## 2014-02-22 MED ORDER — CEPHALEXIN 500 MG PO CAPS
500.0000 mg | ORAL_CAPSULE | Freq: Three times a day (TID) | ORAL | Status: DC
Start: 2014-02-22 — End: 2014-04-16

## 2014-02-22 NOTE — Progress Notes (Signed)
[facility] [address], [place] [address] [phone]  Date:  02/22/2014   Name:  Clifford Gould.   DOB:  01/02/1946   MRN:  [id number]  PCP:  Donia Ast, FNP    Chief Complaint: Leg Injury   History of Present Illness:  Clifford Sagan. is a 68 y.o. very pleasant male patient who presents with the following:  About one month ago he fell and hurt his right leg- he fell from a ladder that was on top of a picnic table on 6/15- he landed on a brick patio.  He scraped up his right leg.  He had x-rays and no fracture.  His leg is generally better, but he notes that one area just does not want to finish healing.  It is not painful, but he has noted some redness and possible pus formation around the area over the last couple of weeks.  He is afraid it is getting infected.   He is otherwise generally healthy  No other complaint today  He is a part time CMA at [employer]  He is NOT diabetic  There are no active problems to display for this patient.   Past Medical History  Diagnosis Date  . Heart murmur   . Chicken pox   . Hyperlipidemia     last year 2013 elevated now normal    Past Surgical History  Procedure Laterality Date  . Splenectomy    . Colonoscopy      15+yrs ago- normal per pt.    History  Substance Use Topics  . Smoking status: Never Smoker   . Smokeless tobacco: Never Used  . Alcohol Use: 1.2 oz/week    2 Cans of beer per week     Comment: occ beer    Family History  Problem Relation Age of Onset  . Pancreatic cancer Mother     pancreatic  . Diabetes Father   . Colon cancer Neg Hx     No Known Allergies  Medication list has been reviewed and updated.  Current Outpatient Prescriptions on File Prior to Visit  Medication Sig Dispense Refill  . acetaminophen (TYLENOL) 500 MG tablet Take 500 mg by mouth every 6 (six) hours as needed for pain.      . diphenhydramine-acetaminophen (TYLENOL PM) 25-500 MG TABS  Take 1 tablet by mouth at bedtime as needed.      . Potassium Gluconate 595 MG CAPS Take 1 capsule by mouth daily.      Marland Kitchen HYDROcodone-acetaminophen (NORCO) 5-325 MG per tablet Take 1 tablet by mouth every 6 (six) hours as needed.  20 tablet  0   No current facility-administered medications on file prior to visit.    Review of Systems:  As per HPI- otherwise negative.   Physical Examination: Filed Vitals:   02/22/14 0845  BP: 128/70  Pulse: 52  Temp: 98.7 F (37.1 C)  Resp: 16   Filed Vitals:   02/22/14 0845  Height: 5\' 5"  (1.651 m)  Weight: 177 lb 9.6 oz (80.559 kg)   Body mass index is 29.55 kg/(m^2). Ideal Body Weight: Weight in (lb) to have BMI = 25: 149.9  GEN: WDWN, NAD, Non-toxic, A & O x 3, looks well HEENT: Atraumatic, Normocephalic. Neck supple. No masses, No LAD. Ears and Nose: No external deformity. CV: RRR, No M/G/R. No JVD. No thrill. No extra heart sounds. PULM: CTA B, no wheezes, crackles, rhonchi. No retractions. No resp. distress.  No accessory muscle use. EXTR: No c/c/e NEURO Normal gait.  PSYCH: Normally interactive. Conversant. Not depressed or anxious appearing.  Calm demeanor.  Right leg: there is a nickel sized shallow ulcer on the lateal right shin, distal. Surrounded by a small area of redness and minimal swelling consistent with mild cellulitis.  Debrided and dressed with silvadene.    Assessment and Plan: Cellulitis of right leg - Plan: cephALEXin (KEFLEX) 500 MG capsule, silver sulfADIAZINE (SILVADENE) 1 % cream  cellulitis following injury and slow healing of leg wound.  Instructed him to clean and dress twice a day with silvadene, and take keflex.  He will let me know if not better in the next few days- Sooner if worse.   See patient instructions for more details.     Signed Lamar Blinks, MD

## 2014-02-22 NOTE — Patient Instructions (Signed)
Take the keflex antibioitc three times a day.  Change your dressing and apply silvadene twice a day.  Keep the area covered unless it seems too moist, in which case leave it open to the air for a time.  I think after 5-6 days of wrapping it will be doing better and can be left open.  Let me know if your leg is not healing well

## 2014-03-01 ENCOUNTER — Encounter: Payer: Self-pay | Admitting: Physician Assistant

## 2014-03-01 ENCOUNTER — Ambulatory Visit (INDEPENDENT_AMBULATORY_CARE_PROVIDER_SITE_OTHER): Payer: Medicare Other | Admitting: Physician Assistant

## 2014-03-01 VITALS — BP 154/78 | HR 66 | Temp 98.7°F | Resp 18 | Wt 176.0 lb

## 2014-03-01 DIAGNOSIS — L03115 Cellulitis of right lower limb: Secondary | ICD-10-CM

## 2014-03-01 DIAGNOSIS — L03119 Cellulitis of unspecified part of limb: Secondary | ICD-10-CM

## 2014-03-01 DIAGNOSIS — L02419 Cutaneous abscess of limb, unspecified: Secondary | ICD-10-CM

## 2014-03-01 NOTE — Progress Notes (Signed)
Subjective:    Patient ID: Clifford Gould., male    DOB: 1946-04-27, 68 y.o.   MRN: [id number]  HPI Patient is a 68 y.o. male presenting for non-healing wound. Pt had fallen and injured right lower leg about a month ago, xrays were normal, symptomatic care was started, and pain management. Pt has seen a physician at [facility] last week because the wound was still not completely healed. The area was cleaned, and silvadene was applied with instructions to change the dressing twice daily with silvadene cream, and Rx for kelfex TID. He presents today stating that it still doesn't seem to be healing. Pt states that it is not really painful. He states that it is still numb feeling. He states that the swelling seems to have gone down some. He feels that it is not really draining much. Currently, his wound care is twice daily silvadene cream, and washing it with an antiseptic wash. He is still taking keflex, on his last day. He denies fevers, chills, nausea, vomiting, diarrhea, SOB, chest pain, headache, syncope.    Review of Systems As per HPI and are otherwise negative.   Past Medical History  Diagnosis Date  . Heart murmur   . Chicken pox   . Hyperlipidemia     last year 2013 elevated now normal    History   Social History  . Marital Status: Married    Spouse Name: N/A    Number of Children: N/A  . Years of Education: N/A   Occupational History  . Not on file.   Social History Main Topics  . Smoking status: Never Smoker   . Smokeless tobacco: Never Used  . Alcohol Use: 1.2 oz/week    2 Cans of beer per week     Comment: occ beer  . Drug Use: No  . Sexual Activity: Not on file   Other Topics Concern  . Not on file   Social History Narrative  . No narrative on file    Past Surgical History  Procedure Laterality Date  . Splenectomy    . Colonoscopy      15+yrs ago- normal per pt.    Family History  Problem Relation Age of Onset  . Pancreatic cancer Mother    pancreatic  . Diabetes Father   . Colon cancer Neg Hx     No Known Allergies  Current Outpatient Prescriptions on File Prior to Visit  Medication Sig Dispense Refill  . acetaminophen (TYLENOL) 500 MG tablet Take 500 mg by mouth every 6 (six) hours as needed for pain.      . cephALEXin (KEFLEX) 500 MG capsule Take 1 capsule (500 mg total) by mouth 3 (three) times daily.  21 capsule  0  . diphenhydramine-acetaminophen (TYLENOL PM) 25-500 MG TABS Take 1 tablet by mouth at bedtime as needed.      Marland Kitchen HYDROcodone-acetaminophen (NORCO) 5-325 MG per tablet Take 1 tablet by mouth every 6 (six) hours as needed.  20 tablet  0  . Potassium Gluconate 595 MG CAPS Take 1 capsule by mouth daily.      . silver sulfADIAZINE (SILVADENE) 1 % cream Apply 1 application topically daily.  20 g  0   No current facility-administered medications on file prior to visit.    EXAM: BP 154/78  Pulse 66  Temp(Src) 98.7 F (37.1 C) (Oral)  Resp 18  Wt 176 lb (79.833 kg)     Objective:   Physical Exam  Nursing note and vitals  reviewed. Constitutional: He is oriented to person, place, and time. He appears well-developed and well-nourished. No distress.  HENT:  Head: Normocephalic and atraumatic.  Eyes: Conjunctivae and EOM are normal. Pupils are equal, round, and reactive to light.  Neck: Normal range of motion.  Cardiovascular: Normal rate and regular rhythm.   Distal pulses on left are normal. Distal pulses on right are decreased, faint.  Pulmonary/Chest: Effort normal and breath sounds normal. No respiratory distress. He exhibits no tenderness.  Musculoskeletal: Normal range of motion. He exhibits no edema and no tenderness.  Neurological: He is alert and oriented to person, place, and time.  Skin: Skin is warm and dry. No rash noted. He is not diaphoretic. No erythema. No pallor.  2 cm round ulceration on the anterior right lower leg, this does not have any significant surrounding erythema, no TTP, no  warmth, swelling, or fluctuance.   Psychiatric: He has a normal mood and affect. His behavior is normal. Judgment and thought content normal.     Lab Results  Component Value Date   WBC 9.9 01/23/2013   HGB 13.6 01/23/2013   HCT 40.6 01/23/2013   PLT 366.0 01/23/2013   GLUCOSE 87 09/29/2013   CHOL 184 01/23/2013   TRIG 130.0 01/23/2013   HDL 34.30* 01/23/2013   LDLDIRECT 103.8 12/24/2011   LDLCALC 124* 01/23/2013   ALT 23 01/23/2013   AST 26 01/23/2013   NA 138 09/29/2013   K 4.1 09/29/2013   CL 104 09/29/2013   CREATININE 1.3 09/29/2013   BUN 25* 09/29/2013   CO2 24 09/29/2013   TSH 2.36 09/29/2013   PSA 1.31 01/23/2013   INR 0.89 09/25/2009        Assessment & Plan:  Irene was seen today for nonhealing wound.  Diagnoses and associated orders for this visit:  Cellulitis of right leg Comments: Non-healing wound. Will refer to wound clinic and schedule arterial doppler of right leg. Continue dressing BID with slivadene, and complete Keflex course. - Ambulatory referral to Wound Clinic - Lower Extremity Arterial Doppler Right; Future    No longer appears cellulitic, Keflex seems to have been effective, will have pt finish out course. Wound still failing to heal, not worsening. Difficult to palpate right lower extremity pulses, hard to determine significance to delayed healing, will order right LE arterial doppler. Continue current wound care, and have pt see wound clinic for evaluation and care.  Pts elevated BP of 154/78 today. Pt states that his BP is normally elevated at Dr. Thomasene Lot, however he checks at home and is normotensive 120s/70s. Instructed pt to keep a dedicated record of home BPs and bring this to PCP in 1 month for follow up to determine need for HTN treatment.  Return precautions provided, and patient handout on cellulitis and HTN.  Plan to follow up in 1 month with PCP to reassess, or for worsening or persistent symptoms despite treatment.  Patient Instructions  We will refer  you to the wound clinic to have your wound evaluated properly.  Due to the possibility for arterial insufficiency possibly contributing to the delayed healing, we have ordered a lower extremity arterial doppler.   Continue monitoring your home blood pressures. Return in about 1 month to see your PCP with a written log of your home BPs to evaluate your need to restart hypertension medication.  If emergency symptoms discussed during visit developed, seek medical attention immediately.  Followup in about 1 month with PCP for BPs, or for worsening or persistent  symptoms despite treatment.

## 2014-03-01 NOTE — Patient Instructions (Signed)
We will refer you to the wound clinic to have your wound evaluated properly.  Due to the possibility for arterial insufficiency possibly contributing to the delayed healing, we have ordered a lower extremity arterial doppler.   Continue monitoring your home blood pressures. Return in about 1 month to see your PCP with a written log of your home BPs to evaluate your need to restart hypertension medication.  If emergency symptoms discussed during visit developed, seek medical attention immediately.  Followup in about 1 month with PCP for BPs, or for worsening or persistent symptoms despite treatment.    Cellulitis Cellulitis is an infection of the skin and the tissue under the skin. The infected area is usually red and tender. This happens most often in the arms and lower legs. HOME CARE   Take your antibiotic medicine as told. Finish the medicine even if you start to feel better.  Keep the infected arm or leg raised (elevated).  Put a warm cloth on the area up to 4 times per day.  Only take medicines as told by your doctor.  Keep all doctor visits as told. GET HELP RIGHT AWAY IF:   You have a fever.  You feel very sleepy.  You throw up (vomit) or have watery poop (diarrhea).  You feel sick and have muscle aches and pains.  You see red streaks on the skin coming from the infected area.  Your red area gets bigger or turns a dark color.  Your bone or joint under the infected area is painful after the skin heals.  Your infection comes back in the same area or different area.  You have a puffy (swollen) bump in the infected area.  You have new symptoms. MAKE SURE YOU:   Understand these instructions.  Will watch your condition.  Will get help right away if you are not doing well or get worse. Document Released: 01/20/2008 Document Revised: 02/02/2012 Document Reviewed: 10/19/2011 [facility] Patient Information 2015 [place], [place]. This information is not intended to  replace advice given to you by your health care provider. Make sure you discuss any questions you have with your health care provider. Hypertension Hypertension is another name for high blood pressure. High blood pressure forces your heart to work harder to pump blood. A blood pressure reading has two numbers, which includes a higher number over a lower number (example: 110/72). HOME CARE   Have your blood pressure rechecked by your doctor.  Only take medicine as told by your doctor. Follow the directions carefully. The medicine does not work as well if you skip doses. Skipping doses also puts you at risk for problems.  Do not smoke.  Monitor your blood pressure at home as told by your doctor. GET HELP IF:  You think you are having a reaction to the medicine you are taking.  You have repeat headaches or feel dizzy.  You have puffiness (swelling) in your ankles.  You have trouble with your vision. GET HELP RIGHT AWAY IF:   You get a very bad headache and are confused.  You feel weak, numb, or faint.  You get chest or belly (abdominal) pain.  You throw up (vomit).  You cannot breathe very well. MAKE SURE YOU:   Understand these instructions.  Will watch your condition.  Will get help right away if you are not doing well or get worse. Document Released: 01/20/2008 Document Revised: 08/08/2013 Document Reviewed: 05/26/2013 [facility] Patient Information 2015 [place], [place]. This information is not intended to  replace advice given to you by your health care provider. Make sure you discuss any questions you have with your health care provider.  

## 2014-03-01 NOTE — Progress Notes (Signed)
Pre visit review using our clinic review tool, if applicable. No additional management support is needed unless otherwise documented below in the visit note. 

## 2014-03-05 ENCOUNTER — Encounter (HOSPITAL_COMMUNITY): Payer: Medicare Other

## 2014-03-06 ENCOUNTER — Other Ambulatory Visit (HOSPITAL_COMMUNITY): Payer: Self-pay | Admitting: *Deleted

## 2014-03-06 DIAGNOSIS — L97909 Non-pressure chronic ulcer of unspecified part of unspecified lower leg with unspecified severity: Secondary | ICD-10-CM

## 2014-03-07 ENCOUNTER — Ambulatory Visit (HOSPITAL_COMMUNITY): Payer: Medicare Other | Attending: Cardiovascular Disease | Admitting: *Deleted

## 2014-03-07 DIAGNOSIS — L98499 Non-pressure chronic ulcer of skin of other sites with unspecified severity: Secondary | ICD-10-CM | POA: Insufficient documentation

## 2014-03-07 DIAGNOSIS — L97909 Non-pressure chronic ulcer of unspecified part of unspecified lower leg with unspecified severity: Secondary | ICD-10-CM | POA: Insufficient documentation

## 2014-03-07 DIAGNOSIS — I739 Peripheral vascular disease, unspecified: Secondary | ICD-10-CM | POA: Insufficient documentation

## 2014-03-07 DIAGNOSIS — R209 Unspecified disturbances of skin sensation: Secondary | ICD-10-CM | POA: Insufficient documentation

## 2014-03-07 NOTE — Progress Notes (Signed)
Lower Arterial Doppler Complete

## 2014-03-15 ENCOUNTER — Encounter (HOSPITAL_BASED_OUTPATIENT_CLINIC_OR_DEPARTMENT_OTHER): Payer: Medicare Other | Attending: Internal Medicine

## 2014-03-15 DIAGNOSIS — L97809 Non-pressure chronic ulcer of other part of unspecified lower leg with unspecified severity: Secondary | ICD-10-CM | POA: Insufficient documentation

## 2014-03-15 NOTE — Progress Notes (Signed)
[facility]  NAME:  Clifford Gould, Clifford Gould NO.:  [id number]  MEDICAL RECORD NO.:  [id number]      DATE OF BIRTH:  01-Apr-1946  PHYSICIAN:  Ricard Dillon, M.D. VISIT DATE:  03/15/2014                                  OFFICE VISIT   CHIEF COMPLAINT:  Review of wound on his right lower leg.  HISTORY OF PRESENT ILLNESS:  Clifford Gould is otherwise healthy man who was doing some work around the house about a month ago.  He put a ladder on top of the table in order to reach the gutters to clean the gutters on his home.  The whole thing collapsed.  He had a fall.  He had multiple superficial lesions on his legs that have healed; however, he has one refractory area on the right lower leg that has not been healing.  He has been applying Silvadene to this.  He is here for our review of this. He does not have a history of prior wounds.  He has already had arterial studies that show good triphasic pressure waves and his segmental pressures are quite normal including his toe brachial pressure.  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  CURRENT MEDICATIONS:  Include potassium gluconate 1 daily, fish oil 1 daily.  PHYSICAL EXAMINATION:  VITAL SIGNS:  Temperature is 98, pulse 57, respirations 16, blood pressure is 157/74.  Vascular assessment not done in this clinic as he has already had normal noninvasive vascular studies.  The wound in question is on his right lateral leg.  Measured 1.6 x 0.9 x 0.1.  On his arrival here, this was covered with a necrotic eschar and rolled edges around the surface of the wound using a #15 scalpel blade and pickups.  We removed this with ease.  Removed the rolled edges from around the circumference of the  wound and did some debridement of the surface of the wound; however, I think this is going to require further enzymatic debridement.  In terms of other issues, there is  no sign of surrounding infection.  He has no major edema in his legs.  No signs of any other venous or arterial issues.  IMPRESSION:  Traumatic wound on the right lateral leg.  This underwent debridement as described.  He does not have any edema.  We prescribed a Santyl based ointment that he can change every day, covered with a foam- based dressing.  I do not think his leg needs to be wrapped.  He does not have significant venous insufficiency and no major swelling is observed.  He can change this every second day.  We will see him again in a week's time.          ______________________________ Ricard Dillon, M.D.     MGR/MEDQ  D:  03/15/2014  T:  03/15/2014  Job:  [id number]

## 2014-03-22 ENCOUNTER — Encounter (HOSPITAL_BASED_OUTPATIENT_CLINIC_OR_DEPARTMENT_OTHER): Payer: Medicare Other | Attending: Internal Medicine

## 2014-03-22 DIAGNOSIS — I872 Venous insufficiency (chronic) (peripheral): Secondary | ICD-10-CM | POA: Diagnosis not present

## 2014-03-22 DIAGNOSIS — L97809 Non-pressure chronic ulcer of other part of unspecified lower leg with unspecified severity: Secondary | ICD-10-CM | POA: Diagnosis present

## 2014-03-29 ENCOUNTER — Encounter: Payer: Medicare Other | Admitting: Family

## 2014-03-29 DIAGNOSIS — L97809 Non-pressure chronic ulcer of other part of unspecified lower leg with unspecified severity: Secondary | ICD-10-CM | POA: Diagnosis not present

## 2014-03-29 DIAGNOSIS — I872 Venous insufficiency (chronic) (peripheral): Secondary | ICD-10-CM | POA: Diagnosis not present

## 2014-04-05 DIAGNOSIS — L97809 Non-pressure chronic ulcer of other part of unspecified lower leg with unspecified severity: Secondary | ICD-10-CM | POA: Diagnosis not present

## 2014-04-05 DIAGNOSIS — I872 Venous insufficiency (chronic) (peripheral): Secondary | ICD-10-CM | POA: Diagnosis not present

## 2014-04-12 DIAGNOSIS — I872 Venous insufficiency (chronic) (peripheral): Secondary | ICD-10-CM | POA: Diagnosis not present

## 2014-04-12 DIAGNOSIS — L97809 Non-pressure chronic ulcer of other part of unspecified lower leg with unspecified severity: Secondary | ICD-10-CM | POA: Diagnosis not present

## 2014-04-16 ENCOUNTER — Ambulatory Visit (INDEPENDENT_AMBULATORY_CARE_PROVIDER_SITE_OTHER): Payer: Medicare Other | Admitting: Family

## 2014-04-16 ENCOUNTER — Encounter: Payer: Self-pay | Admitting: Family

## 2014-04-16 VITALS — BP 134/78 | HR 61 | Ht 64.25 in | Wt 177.0 lb

## 2014-04-16 DIAGNOSIS — B009 Herpesviral infection, unspecified: Secondary | ICD-10-CM | POA: Insufficient documentation

## 2014-04-16 DIAGNOSIS — M25519 Pain in unspecified shoulder: Secondary | ICD-10-CM

## 2014-04-16 DIAGNOSIS — Z125 Encounter for screening for malignant neoplasm of prostate: Secondary | ICD-10-CM

## 2014-04-16 DIAGNOSIS — Z0000 Encounter for general adult medical examination without abnormal findings: Secondary | ICD-10-CM

## 2014-04-16 DIAGNOSIS — M25511 Pain in right shoulder: Secondary | ICD-10-CM

## 2014-04-16 DIAGNOSIS — Z23 Encounter for immunization: Secondary | ICD-10-CM

## 2014-04-16 LAB — CBC WITH DIFFERENTIAL/PLATELET
BASOS ABS: 0 10*3/uL (ref 0.0–0.1)
Basophils Relative: 0.4 % (ref 0.0–3.0)
EOS PCT: 2 % (ref 0.0–5.0)
Eosinophils Absolute: 0.2 10*3/uL (ref 0.0–0.7)
HCT: 40.7 % (ref 39.0–52.0)
Hemoglobin: 13.7 g/dL (ref 13.0–17.0)
Lymphocytes Relative: 48.2 % — ABNORMAL HIGH (ref 12.0–46.0)
Lymphs Abs: 3.7 10*3/uL (ref 0.7–4.0)
MCHC: 33.6 g/dL (ref 30.0–36.0)
MCV: 96.5 fl (ref 78.0–100.0)
MONO ABS: 0.7 10*3/uL (ref 0.1–1.0)
Monocytes Relative: 9.1 % (ref 3.0–12.0)
Neutro Abs: 3.1 10*3/uL (ref 1.4–7.7)
Neutrophils Relative %: 40.3 % — ABNORMAL LOW (ref 43.0–77.0)
PLATELETS: 411 10*3/uL — AB (ref 150.0–400.0)
RBC: 4.22 Mil/uL (ref 4.22–5.81)
RDW: 13.5 % (ref 11.5–15.5)
WBC: 7.8 10*3/uL (ref 4.0–10.5)

## 2014-04-16 LAB — POCT URINALYSIS DIPSTICK
Bilirubin, UA: NEGATIVE
Blood, UA: NEGATIVE
Glucose, UA: NEGATIVE
Ketones, UA: NEGATIVE
Leukocytes, UA: NEGATIVE
Nitrite, UA: NEGATIVE
Spec Grav, UA: 1.02
UROBILINOGEN UA: 0.2
pH, UA: 5

## 2014-04-16 LAB — LIPID PANEL
Cholesterol: 213 mg/dL — ABNORMAL HIGH (ref 0–200)
HDL: 33.9 mg/dL — ABNORMAL LOW (ref >39.00)
NonHDL: 179.1
Total CHOL/HDL Ratio: 6
Triglycerides: 281 mg/dL — ABNORMAL HIGH (ref 0.0–149.0)
VLDL: 56.2 mg/dL — AB (ref 0.0–40.0)

## 2014-04-16 LAB — BASIC METABOLIC PANEL
BUN: 23 mg/dL (ref 6–23)
CO2: 24 mEq/L (ref 19–32)
Calcium: 9.1 mg/dL (ref 8.4–10.5)
Chloride: 111 mEq/L (ref 96–112)
Creatinine, Ser: 1.4 mg/dL (ref 0.4–1.5)
GFR: 55.98 mL/min — AB (ref >60.00)
Glucose, Bld: 102 mg/dL — ABNORMAL HIGH (ref 70–99)
Potassium: 5.5 mEq/L — ABNORMAL HIGH (ref 3.5–5.1)
SODIUM: 142 meq/L (ref 135–145)

## 2014-04-16 LAB — HEPATIC FUNCTION PANEL
ALK PHOS: 62 U/L (ref 39–117)
ALT: 21 U/L (ref 0–53)
AST: 30 U/L (ref 0–37)
Albumin: 3.8 g/dL (ref 3.5–5.2)
BILIRUBIN DIRECT: 0.1 mg/dL (ref 0.0–0.3)
Total Bilirubin: 0.5 mg/dL (ref 0.2–1.2)
Total Protein: 6.9 g/dL (ref 6.0–8.3)

## 2014-04-16 LAB — TSH: TSH: 3.16 u[IU]/mL (ref 0.35–4.50)

## 2014-04-16 LAB — LDL CHOLESTEROL, DIRECT: Direct LDL: 116.2 mg/dL

## 2014-04-16 LAB — PSA: PSA: 1.87 ng/mL (ref 0.10–4.00)

## 2014-04-16 MED ORDER — VALACYCLOVIR HCL 1 G PO TABS: 1000.0000 mg | ORAL_TABLET | Freq: Two times a day (BID) | ORAL | Status: DC

## 2014-04-16 NOTE — Progress Notes (Signed)
Pre visit review using our clinic review tool, if applicable. No additional management support is needed unless otherwise documented below in the visit note. 

## 2014-04-16 NOTE — Patient Instructions (Signed)
Exercise to Stay Healthy Exercise helps you become and stay healthy. EXERCISE IDEAS AND TIPS Choose exercises that:  You enjoy.  Fit into your day. You do not need to exercise really hard to be healthy. You can do exercises at a slow or medium level and stay healthy. You can:  Stretch before and after working out.  Try yoga, Pilates, or tai chi.  Lift weights.  Walk fast, swim, jog, run, climb stairs, bicycle, dance, or rollerskate.  Take aerobic classes. Exercises that burn about 150 calories:  Running 1  miles in 15 minutes.  Playing volleyball for 45 to 60 minutes.  Washing and waxing a car for 45 to 60 minutes.  Playing touch football for 45 minutes.  Walking 1  miles in 35 minutes.  Pushing a stroller 1  miles in 30 minutes.  Playing basketball for 30 minutes.  Raking leaves for 30 minutes.  Bicycling 5 miles in 30 minutes.  Walking 2 miles in 30 minutes.  Dancing for 30 minutes.  Shoveling snow for 15 minutes.  Swimming laps for 20 minutes.  Walking up stairs for 15 minutes.  Bicycling 4 miles in 15 minutes.  Gardening for 30 to 45 minutes.  Jumping rope for 15 minutes.  Washing windows or floors for 45 to 60 minutes. Document Released: 09/05/2010 Document Revised: 10/26/2011 Document Reviewed: 09/05/2010 [employer] Patient Information 2015 [employer]. This information is not intended to replace advice given to you by your health care provider. Make sure you discuss any questions you have with your health care provider.  

## 2014-04-16 NOTE — Progress Notes (Signed)
Subjective:    Patient ID: Clifford Clos., male    DOB: [birth year], 68 y.o.   MRN: [id number]  HPI 68 year old white male, nonsmoker is in today for complete physical exam. Has concerns of right shoulder pain that occurs intermittently only when he lifts his arm above dictated. Describes the pain as sharp. Return to 7/10. Better at rest. No injury.  Patient presents for yearly preventative medicine examination.  All immunizations and health maintenance protocols were reviewed with the patient and needed orders were placed.  Appropriate screening laboratory values were ordered for the patient including screening of hyperlipidemia, renal function and hepatic function. If indicated by BPH, a PSA was ordered.  Medication reconciliation,  past medical history, social history, problem list and allergies were reviewed in detail with the patient  Goals were established with regard to weight loss, exercise, and  diet in compliance with medications     Review of Systems  Constitutional: Negative.   HENT: Negative.   Eyes: Negative.   Respiratory: Negative.   Cardiovascular: Negative.   Gastrointestinal: Negative.   Endocrine: Negative.   Genitourinary: Negative.   Musculoskeletal: Negative.   Skin: Negative.   Allergic/Immunologic: Negative.   Neurological: Negative.   Hematological: Negative.   Psychiatric/Behavioral: Negative.    Past Medical History  Diagnosis Date  . Heart murmur   . Chicken pox   . Hyperlipidemia     last year 2013 elevated now normal    History   Social History  . Marital Status: Married    Spouse Name: N/A    Number of Children: N/A  . Years of Education: N/A   Occupational History  . Not on file.   Social History Main Topics  . Smoking status: Never Smoker   . Smokeless tobacco: Never Used  . Alcohol Use: 1.2 oz/week    2 Cans of beer per week     Comment: occ beer  . Drug Use: No  . Sexual Activity: Not on file   Other Topics  Concern  . Not on file   Social History Narrative  . No narrative on file    Past Surgical History  Procedure Laterality Date  . Splenectomy    . Colonoscopy      15+yrs ago- normal per pt.    Family History  Problem Relation Age of Onset  . Pancreatic cancer Mother     pancreatic  . Diabetes Father   . Colon cancer Neg Hx     No Known Allergies  Current Outpatient Prescriptions on File Prior to Visit  Medication Sig Dispense Refill  . acetaminophen (TYLENOL) 500 MG tablet Take 500 mg by mouth every 6 (six) hours as needed for pain.      . diphenhydramine-acetaminophen (TYLENOL PM) 25-500 MG TABS Take 1 tablet by mouth at bedtime as needed.      . Potassium Gluconate 595 MG CAPS Take 1 capsule by mouth daily.       No current facility-administered medications on file prior to visit.    BP 134/78  Pulse 61  Ht 5' 4.25" (1.632 m)  Wt 177 lb (80.287 kg)  BMI 30.14 kg/m2    Objective:   Physical Exam  Constitutional: He is oriented to person, place, and time. He appears well-developed and well-nourished.  HENT:  Head: Normocephalic and atraumatic.  Right Ear: External ear normal.  Left Ear: External ear normal.  Nose: Nose normal.  Mouth/Throat: Oropharynx is clear and moist.  Eyes: Conjunctivae and  EOM are normal. Pupils are equal, round, and reactive to light.  Neck: Normal range of motion. Neck supple. No thyromegaly present.  Cardiovascular: Normal rate, regular rhythm and normal heart sounds.   Pulmonary/Chest: Effort normal and breath sounds normal.  Abdominal: Soft. Bowel sounds are normal.  Genitourinary: Rectum normal, prostate normal and penis normal. Guaiac negative stool. No penile tenderness.  Musculoskeletal: Normal range of motion. He exhibits no edema and no tenderness.  Neurological: He is alert and oriented to person, place, and time. He has normal reflexes. No cranial nerve deficit. Coordination normal.  Skin: Skin is warm and dry.  Psychiatric:  He has a normal mood and affect.          Assessment & Plan:  Clifford Gould was seen today for annual exam.  Diagnoses and associated orders for this visit:  Preventative health care - Basic Metabolic Panel - Hepatic Function Panel - Lipid Panel - POC Urinalysis Dipstick - CBC with Differential - TSH  Right shoulder pain  Herpes simplex - CBC with Differential  Screening for prostate cancer - PSA  Need for prophylactic vaccination against Streptococcus pneumoniae (pneumococcus) - Pneumococcal conjugate vaccine 13-valent  Other Orders - valACYclovir (VALTREX) 1000 MG tablet; Take 1 tablet (1,000 mg total) by mouth 2 (two) times daily.   Call the office with any questions or concerns. Recheck in 6 months and sooner as needed. Patient does not want to pursue shoulder issue at this point.

## 2014-04-19 ENCOUNTER — Encounter (HOSPITAL_BASED_OUTPATIENT_CLINIC_OR_DEPARTMENT_OTHER): Payer: Medicare Other | Attending: Internal Medicine

## 2014-04-19 DIAGNOSIS — L97809 Non-pressure chronic ulcer of other part of unspecified lower leg with unspecified severity: Secondary | ICD-10-CM | POA: Insufficient documentation

## 2014-04-19 DIAGNOSIS — I872 Venous insufficiency (chronic) (peripheral): Secondary | ICD-10-CM | POA: Diagnosis not present

## 2014-04-26 DIAGNOSIS — I872 Venous insufficiency (chronic) (peripheral): Secondary | ICD-10-CM | POA: Diagnosis not present

## 2014-04-26 DIAGNOSIS — L97809 Non-pressure chronic ulcer of other part of unspecified lower leg with unspecified severity: Secondary | ICD-10-CM | POA: Diagnosis not present

## 2014-05-03 DIAGNOSIS — L97809 Non-pressure chronic ulcer of other part of unspecified lower leg with unspecified severity: Secondary | ICD-10-CM | POA: Diagnosis not present

## 2014-05-03 DIAGNOSIS — I872 Venous insufficiency (chronic) (peripheral): Secondary | ICD-10-CM | POA: Diagnosis not present

## 2014-06-01 ENCOUNTER — Other Ambulatory Visit: Payer: Self-pay

## 2014-09-25 ENCOUNTER — Ambulatory Visit (INDEPENDENT_AMBULATORY_CARE_PROVIDER_SITE_OTHER): Payer: Medicare Other | Admitting: Family

## 2014-09-25 ENCOUNTER — Ambulatory Visit (INDEPENDENT_AMBULATORY_CARE_PROVIDER_SITE_OTHER)
Admission: RE | Admit: 2014-09-25 | Discharge: 2014-09-25 | Disposition: A | Discharge status: Home / Self Care | Payer: Medicare Other | Source: Ambulatory Visit | Attending: Family | Admitting: Family

## 2014-09-25 ENCOUNTER — Encounter: Payer: Self-pay | Admitting: Family

## 2014-09-25 VITALS — BP 152/76 | HR 68 | Temp 97.8°F | Resp 18 | Ht 65.0 in | Wt 179.0 lb

## 2014-09-25 DIAGNOSIS — M25511 Pain in right shoulder: Secondary | ICD-10-CM

## 2014-09-25 DIAGNOSIS — M19011 Primary osteoarthritis, right shoulder: Secondary | ICD-10-CM | POA: Diagnosis not present

## 2014-09-25 NOTE — Progress Notes (Signed)
   Subjective:    Patient ID: Clifford Gould., male    DOB: 02-21-1946, 69 y.o.   MRN: [id number]  Chief Complaint  Patient presents with  . Establish Care    Has occasional pain in his left knee     HPI:  Clifford Gould. is a 69 y.o. male who presents today to establish care and discuss his right shoulder.   Right shoulder -this is a new problem; associated symptoms of right shoulder pain and decreased range of motion has been going on for several months The location of the pain is around the right deltoid muscle. Context of the pain occurs when reaching for things above 90 degrees of abduction. When the pain occurs the intensity is 10/10 and goes away within a couple seconds of stopping the activity. Believes it may have started with a fall from a ladder but is unsure.  It has effected his ability to play tennis.   No Known Allergies   Current Outpatient Prescriptions on File Prior to Visit  Medication Sig Dispense Refill  . acetaminophen (TYLENOL) 500 MG tablet Take 500 mg by mouth every 6 (six) hours as needed for pain.    Marland Kitchen Potassium Gluconate 595 MG CAPS Take 1 capsule by mouth daily.    . valACYclovir (VALTREX) 1000 MG tablet Take 1 tablet (1,000 mg total) by mouth 2 (two) times daily. 20 tablet 1   No current facility-administered medications on file prior to visit.    Review of Systems  Musculoskeletal: Positive for arthralgias (Positive for right shoulder pain). Negative for neck pain and neck stiffness.  Neurological: Negative for numbness.       Objective:    BP 152/76 mmHg  Pulse 68  Temp(Src) 97.8 F (36.6 C) (Oral)  Resp 18  Ht 5\' 5"  (1.651 m)  Wt 179 lb (81.194 kg)  BMI 29.79 kg/m2  SpO2 98% Nursing note and vital signs reviewed.  Physical Exam  Constitutional: He is oriented to person, place, and time. He appears well-developed and well-nourished. No distress.  Cardiovascular: Normal rate, regular rhythm, normal heart sounds and intact distal  pulses.   Pulmonary/Chest: Effort normal and breath sounds normal.  Musculoskeletal:  Right shoulder: No obvious deformity, discoloration, or edema of right shoulder noted. Patient displays limited abduction and internal rotation. No palpable tenderness elicited. Pain reproduced with Apley scratch test for extension internal rotation and abduction. Strength is normal. Distal pulses and sensation are intact and appropriate. Negative apprehension test; negative impingement test.  Neurological: He is alert and oriented to person, place, and time.  Skin: Skin is warm and dry.  Psychiatric: He has a normal mood and affect. His behavior is normal. Judgment and thought content normal.       Assessment & Plan:

## 2014-09-25 NOTE — Patient Instructions (Addendum)
Thank you for choosing [facility].  Summary/Instructions:  Please stop by radiology on the basement level of the building for your x-rays. Your results will be released to [facility] (or called to you) after review, usually within 72 hours after test completion. If any treatments or changes are necessary, you will be notified at that same time.  If your symptoms worsen or fail to improve, please contact our office for further instruction, or in case of emergency go directly to the emergency room at the closest medical facility.   Shoulder Pain The shoulder is the joint that connects your arms to your body. The bones that form the shoulder joint include the upper arm bone (humerus), the shoulder blade (scapula), and the collarbone (clavicle). The top of the humerus is shaped like a ball and fits into a rather flat socket on the scapula (glenoid cavity). A combination of muscles and strong, fibrous tissues that connect muscles to bones (tendons) support your shoulder joint and hold the ball in the socket. Small, fluid-filled sacs (bursae) are located in different areas of the joint. They act as cushions between the bones and the overlying soft tissues and help reduce friction between the gliding tendons and the bone as you move your arm. Your shoulder joint allows a wide range of motion in your arm. This range of motion allows you to do things like scratch your back or throw a ball. However, this range of motion also makes your shoulder more prone to pain from overuse and injury. Causes of shoulder pain can originate from both injury and overuse and usually can be grouped in the following four categories:  Redness, swelling, and pain (inflammation) of the tendon (tendinitis) or the bursae (bursitis).  Instability, such as a dislocation of the joint.  Inflammation of the joint (arthritis).  Broken bone (fracture). HOME CARE INSTRUCTIONS   Apply ice to the sore area.  Put ice in a plastic  bag.  Place a towel between your skin and the bag.  Leave the ice on for 15-20 minutes, 3-4 times per day for the first 2 days, or as directed by your health care provider.  Stop using cold packs if they do not help with the pain.  If you have a shoulder sling or immobilizer, wear it as long as your caregiver instructs. Only remove it to shower or bathe. Move your arm as little as possible, but keep your hand moving to prevent swelling.  Squeeze a soft ball or foam pad as much as possible to help prevent swelling.  Only take over-the-counter or prescription medicines for pain, discomfort, or fever as directed by your caregiver. SEEK MEDICAL CARE IF:   Your shoulder pain increases, or new pain develops in your arm, hand, or fingers.  Your hand or fingers become cold and numb.  Your pain is not relieved with medicines. SEEK IMMEDIATE MEDICAL CARE IF:   Your arm, hand, or fingers are numb or tingling.  Your arm, hand, or fingers are significantly swollen or turn white or blue. MAKE SURE YOU:   Understand these instructions.  Will watch your condition.  Will get help right away if you are not doing well or get worse. Document Released: 05/13/2005 Document Revised: 12/18/2013 Document Reviewed: 07/18/2011 [facility] Patient Information 2015 [place], [place]. This information is not intended to replace advice given to you by your health care provider. Make sure you discuss any questions you have with your health care provider.

## 2014-09-25 NOTE — Progress Notes (Signed)
Pre visit review using our clinic review tool, if applicable. No additional management support is needed unless otherwise documented below in the visit note. 

## 2014-09-25 NOTE — Assessment & Plan Note (Signed)
Decreased range of motion and pain are potentially mechanically related. Cannot rule out bursitis or calcification. Low suspicion for adhesive capsulitis. Obtain right shoulder x-ray to rule out calcification. Refer to sports medicine for further imaging and treatment.

## 2015-04-18 ENCOUNTER — Encounter: Payer: Self-pay | Admitting: Family

## 2015-04-18 ENCOUNTER — Other Ambulatory Visit (INDEPENDENT_AMBULATORY_CARE_PROVIDER_SITE_OTHER): Payer: Medicare Other

## 2015-04-18 ENCOUNTER — Ambulatory Visit (INDEPENDENT_AMBULATORY_CARE_PROVIDER_SITE_OTHER): Payer: Medicare Other | Admitting: Family

## 2015-04-18 VITALS — BP 160/92 | HR 51 | Temp 97.8°F | Resp 18 | Ht 65.0 in | Wt 174.0 lb

## 2015-04-18 DIAGNOSIS — Z0001 Encounter for general adult medical examination with abnormal findings: Secondary | ICD-10-CM | POA: Insufficient documentation

## 2015-04-18 DIAGNOSIS — E785 Hyperlipidemia, unspecified: Secondary | ICD-10-CM | POA: Diagnosis not present

## 2015-04-18 DIAGNOSIS — Z0000 Encounter for general adult medical examination without abnormal findings: Secondary | ICD-10-CM | POA: Insufficient documentation

## 2015-04-18 DIAGNOSIS — R7989 Other specified abnormal findings of blood chemistry: Secondary | ICD-10-CM | POA: Diagnosis not present

## 2015-04-18 LAB — CBC
HCT: 40 % (ref 39.0–52.0)
HEMOGLOBIN: 13.5 g/dL (ref 13.0–17.0)
MCHC: 33.8 g/dL (ref 30.0–36.0)
MCV: 94.6 fl (ref 78.0–100.0)
Platelets: 347 10*3/uL (ref 150.0–400.0)
RBC: 4.23 Mil/uL (ref 4.22–5.81)
RDW: 13.1 % (ref 11.5–15.5)
WBC: 8 10*3/uL (ref 4.0–10.5)

## 2015-04-18 LAB — LIPID PANEL
CHOLESTEROL: 175 mg/dL (ref 0–200)
HDL: 34.2 mg/dL — ABNORMAL LOW (ref >39.00)
NonHDL: 140.83
TRIGLYCERIDES: 236 mg/dL — AB (ref 0.0–149.0)
Total CHOL/HDL Ratio: 5
VLDL: 47.2 mg/dL — ABNORMAL HIGH (ref 0.0–40.0)

## 2015-04-18 LAB — COMPREHENSIVE METABOLIC PANEL
ALK PHOS: 64 U/L (ref 39–117)
ALT: 22 U/L (ref 0–53)
AST: 30 U/L (ref 0–37)
Albumin: 4.1 g/dL (ref 3.5–5.2)
BILIRUBIN TOTAL: 0.7 mg/dL (ref 0.2–1.2)
BUN: 17 mg/dL (ref 6–23)
CO2: 27 mEq/L (ref 19–32)
CREATININE: 1.11 mg/dL (ref 0.40–1.50)
Calcium: 9.2 mg/dL (ref 8.4–10.5)
Chloride: 108 mEq/L (ref 96–112)
GFR: 69.96 mL/min (ref >60.00)
GLUCOSE: 100 mg/dL — AB (ref 70–99)
Potassium: 4.6 mEq/L (ref 3.5–5.1)
Sodium: 141 mEq/L (ref 135–145)
TOTAL PROTEIN: 6.6 g/dL (ref 6.0–8.3)

## 2015-04-18 LAB — PSA: PSA: 2.65 ng/mL (ref 0.10–4.00)

## 2015-04-18 LAB — LDL CHOLESTEROL, DIRECT: LDL DIRECT: 102 mg/dL

## 2015-04-18 LAB — TSH: TSH: 2.02 u[IU]/mL (ref 0.35–4.50)

## 2015-04-18 NOTE — Progress Notes (Addendum)
Subjective:    Patient ID: Clifford Clos., male    DOB: [birth year], 69 y.o.   MRN: [id number]  Chief Complaint  Patient presents with  . CPE    Not fasting    HPI:  Clifford Gellatly. is a 69 y.o. male who presents today for an annual wellness visit.   1) Health Maintenance -   Diet - Averages about 2 meals per day fruits, vegetables, chicken, red meat, pork and dairy; 2-3 cups of caffeine daily  Exercise - Walks dogs occasionally; has been moving, lifting and carrying  2) Preventative Exams / Immunizations:  Dental -- Up to date  Vision -- Up to date    Health Maintenance  Topic Date Due  . Hepatitis C Screening  1946/03/28  . ZOSTAVAX  01/08/2007  . INFLUENZA VACCINE  04/05/2016 (Originally 03/18/2015)  . PNA vac Low Risk Adult (2 of 2 - PPSV23) 12/23/2016  . TETANUS/TDAP  12/23/2021  . COLONOSCOPY  04/26/2023     Immunization History  Administered Date(s) Administered  . Pneumococcal Conjugate-13 04/16/2014  . Pneumococcal Polysaccharide-23 12/24/2011  . Tetanus 12/24/2011     RISK FACTORS  Tobacco History  Smoking status  . Never Smoker   Smokeless tobacco  . Never Used     Cardiac risk factors: advanced age (older than 59 for men, 57 for women) and male gender.  Depression Screen  Q1: Over the past two weeks, have you felt down, depressed or hopeless? No  Q2: Over the past two weeks, have you felt little interest or pleasure in doing things? No  Have you lost interest or pleasure in daily life? No  Do you often feel hopeless? No  Do you cry easily over simple problems? No  Activities of Daily Living In your present state of health, do you have any difficulty performing the following activities?:  Driving? No Managing money?  No Feeding yourself? No Getting from bed to chair? No Climbing a flight of stairs? No Preparing food and eating?: No Bathing or showering? No Getting dressed: No Getting to the toilet? No Using the toilet:  No Moving around from place to place: No In the past year have you fallen or had a near fall?:No   Home Safety Has smoke detector and wears seat belts. No firearms. No excess sun exposure. Are there smokers in your home (other than you)?  No Do you feel safe at home?  Yes  Hearing Difficulties: No Do you often ask people to speak up or repeat themselves? No Do you experience ringing or noises in your ears? No  Do you have difficulty understanding soft or whispered voices? No    Cognitive Testing  Alert? Yes   Normal Appearance? Yes  Oriented to person? Yes  Place? Yes   Time? Yes  Recall of three objects?  Yes  Can perform simple calculations? Yes  Displays appropriate judgment? Yes  Can read the correct time from a watch face? Yes  Do you feel that you have a problem with memory? No  Do you often misplace items? No   Advanced Directives have been discussed with the patient?   Current Physicians/Providers and Suppliers  1. Terri Piedra, FNP - PCP  Indicate any recent Medical Services you may have received from other than [facility] providers in the past year (date may be approximate).  All answers were reviewed with the patient and necessary referrals were made:  Clifford Gould, [place]   04/18/2015   No  Known Allergies   Outpatient Prescriptions Prior to Visit  Medication Sig Dispense Refill  . acetaminophen (TYLENOL) 500 MG tablet Take 500 mg by mouth every 6 (six) hours as needed for pain.    Marland Kitchen Potassium Gluconate 595 MG CAPS Take 1 capsule by mouth daily.    . valACYclovir (VALTREX) 1000 MG tablet Take 1 tablet (1,000 mg total) by mouth 2 (two) times daily. 20 tablet 1   No facility-administered medications prior to visit.     Past Medical History  Diagnosis Date  . Chicken pox   . Hyperlipidemia     last year 2013 elevated now normal  . Heart murmur   . Herpes simplex   . Erectile dysfunction      Past Surgical History  Procedure Laterality Date  . Splenectomy     . Colonoscopy      15+yrs ago- normal per pt.     Family History  Problem Relation Age of Onset  . Pancreatic cancer Mother     pancreatic  . Diabetes Father   . Colon cancer Neg Hx      Social History   Social History  . Marital Status: Married    Spouse Name: N/A  . Number of Children: 3  . Years of Education: 16   Occupational History  . Nurse Aide     Social History Main Topics  . Smoking status: Never Smoker   . Smokeless tobacco: Never Used  . Alcohol Use: 1.2 oz/week    2 Cans of beer per week     Comment: occ beer  . Drug Use: No  . Sexual Activity: Not on file   Other Topics Concern  . Not on file   Social History Narrative   Born and raised in [place], [place]. Currently resides in a house with his wife. 2 dogs (1 boxer, half-lab/pitbull). Fun: Play golf, tennis and read.    Denies religious beliefs that would effect health care.      Review of Systems  Constitutional: Denies fever, chills, fatigue, or significant weight gain/loss. HENT: Head: Denies headache or neck pain Ears: Denies changes in hearing, ringing in ears, earache, drainage Nose: Denies discharge, stuffiness, itching, nosebleed, sinus pain Throat: Denies sore throat, hoarseness, dry mouth, sores, thrush Eyes: Denies loss/changes in vision, pain, redness, blurry/double vision, flashing lights Cardiovascular: Denies chest pain/discomfort, tightness, palpitations, shortness of breath with activity, difficulty lying down, swelling, sudden awakening with shortness of breath Respiratory: Denies shortness of breath, cough, sputum production, wheezing Gastrointestinal: Denies dysphasia, heartburn, change in appetite, nausea, change in bowel habits, rectal bleeding, constipation, diarrhea, yellow skin or eyes Genitourinary: Denies frequency, urgency, burning/pain, blood in urine, incontinence, change in urinary strength. Musculoskeletal: Denies muscle/joint pain, stiffness, back pain, redness or  swelling of joints, trauma Skin: Denies rashes, lumps, itching, dryness, color changes, or hair/nail changes Neurological: Denies dizziness, fainting, seizures, weakness, numbness, tingling, tremor Psychiatric - Denies nervousness, stress, depression or memory loss Endocrine: Denies heat or cold intolerance, sweating, frequent urination, excessive thirst, changes in appetite Hematologic: Denies ease of bruising or bleeding    Objective:    BP 160/92 mmHg  Pulse 51  Temp(Src) 97.8 F (36.6 C) (Oral)  Resp 18  Ht 5\' 5"  (1.651 m)  Wt 174 lb (78.926 kg)  BMI 28.96 kg/m2  SpO2 96% Nursing note and vital signs reviewed.   Physical Exam  Constitutional: He is oriented to person, place, and time. He appears well-developed and well-nourished.  HENT:  Head: Normocephalic.  Right Ear: Hearing, tympanic membrane, external ear and ear canal normal.  Left Ear: Hearing, tympanic membrane, external ear and ear canal normal.  Nose: Nose normal.  Mouth/Throat: Uvula is midline, oropharynx is clear and moist and mucous membranes are normal.  Eyes: Conjunctivae and EOM are normal. Pupils are equal, round, and reactive to light.  Neck: Neck supple. No JVD present. No tracheal deviation present. No thyromegaly present.  Cardiovascular: Normal rate, regular rhythm, normal heart sounds and intact distal pulses.   Pulmonary/Chest: Effort normal and breath sounds normal.  Abdominal: Soft. Bowel sounds are normal. He exhibits no distension and no mass. There is no tenderness. There is no rebound and no guarding.  Musculoskeletal: Normal range of motion. He exhibits no edema or tenderness.  Lymphadenopathy:    He has no cervical adenopathy.  Neurological: He is alert and oriented to person, place, and time. He has normal reflexes. No cranial nerve deficit. He exhibits normal muscle tone. Coordination normal.  Skin: Skin is warm and dry.  Psychiatric: He has a normal mood and affect. His behavior is  normal. Judgment and thought content normal.       Assessment & Plan:   During the course of the visit the patient was educated and counseled about appropriate screening and preventive services including:    Pneumococcal vaccine   Influenza vaccine  Td vaccine  Prostate cancer screening  Colorectal cancer screening  Diabetes screening  Nutrition counseling   Diet review for nutrition referral? Yes ____  Not Indicated _X___   Patient Instructions (the written plan) was given to the patient.  Medicare Attestation I have personally reviewed: The patient's medical and social history Their use of alcohol, tobacco or illicit drugs Their current medications and supplements The patient's functional ability including ADLs,fall risks, home safety risks, cognitive, and hearing and visual impairment Diet and physical activities Evidence for depression or mood disorders  The patient's weight, height, BMI,  have been recorded in the chart.  I have made referrals, counseling, and provided education to the patient based on review of the above and I have provided the patient with a written personalized care plan for preventive services.     Clifford Gould, [place]   04/18/2015

## 2015-04-18 NOTE — Progress Notes (Signed)
Pre visit review using our clinic review tool, if applicable. No additional management support is needed unless otherwise documented below in the visit note. 

## 2015-04-18 NOTE — Assessment & Plan Note (Signed)
1) Anticipatory Guidance: Discussed importance of wearing a seatbelt while driving and not texting while driving; changing batteries in smoke detector at least once annually; wearing suntan lotion when outside; eating a balanced and moderate diet; getting physical activity at least 30 minutes per day.  2) Immunizations / Screenings / Labs:  Declines flu shot today. Declines Zostavax. All other immunizations are up to date per recommendations. Due for PSA to check prostate - lab order placed. All other screenings are up to date per recommendations. Obtain CBC, CMET, Lipid profile and TSH.  Overall well exam. Does have elevated blood pressure which he indicates at home averages around 130/80. Continue to monitor at this time and consider medication if continues to have higher average readings. Encouraged eating a nutrient dense diet and decreasing saturated fat while also increasing physical activity. Goal weight loss of about 5-10% of current body weight. Follow up prevention exam in 1 year and follow up office visit pending lab work.

## 2015-04-18 NOTE — Patient Instructions (Addendum)
Thank you for choosing [facility].  Summary/Instructions:  Your prescription(s) have been submitted to your pharmacy or been printed and provided for you. Please take as directed and contact our office if you believe you are having problem(s) with the medication(s) or have any questions.  Please stop by the lab on the basement level of the building for your blood work. Your results will be released to [place] (or called to you) after review, usually within 72 hours after test completion. If any changes need to be made, you will be notified at that same time.  Please stop by radiology on the basement level of the building for your x-rays. Your results will be released to [place] (or called to you) after review, usually within 72 hours after test completion. If any treatments or changes are necessary, you will be notified at that same time.  Referrals have been made during this visit. You should expect to hear back from our schedulers in about 7-10 days in regards to establishing an appointment with the specialists we discussed.   If your symptoms worsen or fail to improve, please contact our office for further instruction, or in case of emergency go directly to the emergency room at the closest medical facility.    Health Maintenance  Topic Date Due  . Hepatitis C Screening  September 09, 1945  . ZOSTAVAX  01/08/2007  . INFLUENZA VACCINE  04/05/2016 (Originally 03/18/2015)  . PNA vac Low Risk Adult (2 of 2 - PPSV23) 12/23/2016  . TETANUS/TDAP  12/23/2021  . COLONOSCOPY  04/26/2023    Health Maintenance A healthy lifestyle and preventative care can promote health and wellness.  Maintain regular health, dental, and eye exams.  Eat a healthy diet. Foods like vegetables, fruits, whole grains, low-fat dairy products, and lean protein foods contain the nutrients you need and are low in calories. Decrease your intake of foods high in solid fats, added sugars, and salt. Get information about a  proper diet from your health care provider, if necessary.  Regular physical exercise is one of the most important things you can do for your health. Most adults should get at least 150 minutes of moderate-intensity exercise (any activity that increases your heart rate and causes you to sweat) each week. In addition, most adults need muscle-strengthening exercises on 2 or more days a week.   Maintain a healthy weight. The body mass index (BMI) is a screening tool to identify possible weight problems. It provides an estimate of body fat based on height and weight. Your health care provider can find your BMI and can help you achieve or maintain a healthy weight. For males 20 years and older:  A BMI below 18.5 is considered underweight.  A BMI of 18.5 to 24.9 is normal.  A BMI of 25 to 29.9 is considered overweight.  A BMI of 30 and above is considered obese.  Maintain normal blood lipids and cholesterol by exercising and minimizing your intake of saturated fat. Eat a balanced diet with plenty of fruits and vegetables. Blood tests for lipids and cholesterol should begin at age 81 and be repeated every 5 years. If your lipid or cholesterol levels are high, you are over age 15, or you are at high risk for heart disease, you may need your cholesterol levels checked more frequently.Ongoing high lipid and cholesterol levels should be treated with medicines if diet and exercise are not working.  If you smoke, find out from your health care provider how to quit. If you  do not use tobacco, do not start.  Lung cancer screening is recommended for adults aged 78-80 years who are at high risk for developing lung cancer because of a history of smoking. A yearly low-dose CT scan of the lungs is recommended for people who have at least a 30-pack-year history of smoking and are current smokers or have quit within the past 15 years. A pack year of smoking is smoking an average of 1 pack of cigarettes a day for 1 year  (for example, a 30-pack-year history of smoking could mean smoking 1 pack a day for 30 years or 2 packs a day for 15 years). Yearly screening should continue until the smoker has stopped smoking for at least 15 years. Yearly screening should be stopped for people who develop a health problem that would prevent them from having lung cancer treatment.  If you choose to drink alcohol, do not have more than 2 drinks per day. One drink is considered to be 12 oz (360 mL) of beer, 5 oz (150 mL) of wine, or 1.5 oz (45 mL) of liquor.  Avoid the use of street drugs. Do not share needles with anyone. Ask for help if you need support or instructions about stopping the use of drugs.  High blood pressure causes heart disease and increases the risk of stroke. Blood pressure should be checked at least every 1-2 years. Ongoing high blood pressure should be treated with medicines if weight loss and exercise are not effective.  If you are 75-79 years old, ask your health care provider if you should take aspirin to prevent heart disease.  Diabetes screening involves taking a blood sample to check your fasting blood sugar level. This should be done once every 3 years after age 41 if you are at a normal weight and without risk factors for diabetes. Testing should be considered at a younger age or be carried out more frequently if you are overweight and have at least 1 risk factor for diabetes.  Colorectal cancer can be detected and often prevented. Most routine colorectal cancer screening begins at the age of 12 and continues through age 30. However, your health care provider may recommend screening at an earlier age if you have risk factors for colon cancer. On a yearly basis, your health care provider may provide home test kits to check for hidden blood in the stool. A small camera at the end of a tube may be used to directly examine the colon (sigmoidoscopy or colonoscopy) to detect the earliest forms of colorectal cancer.  Talk to your health care provider about this at age 45 when routine screening begins. A direct exam of the colon should be repeated every 5-10 years through age 71, unless early forms of precancerous polyps or small growths are found.  People who are at an increased risk for hepatitis B should be screened for this virus. You are considered at high risk for hepatitis B if:  You were born in a country where hepatitis B occurs often. Talk with your health care provider about which countries are considered high risk.  Your parents were born in a high-risk country and you have not received a shot to protect against hepatitis B (hepatitis B vaccine).  You have HIV or AIDS.  You use needles to inject street drugs.  You live with, or have sex with, someone who has hepatitis B.  You are a man who has sex with other men (MSM).  You get hemodialysis treatment.  You take certain medicines for conditions like cancer, organ transplantation, and autoimmune conditions.  Hepatitis C blood testing is recommended for all people born from [birth year] through 1964 and any individual with known risk factors for hepatitis C.  Healthy men should no longer receive prostate-specific antigen (PSA) blood tests as part of routine cancer screening. Talk to your health care provider about prostate cancer screening.  Testicular cancer screening is not recommended for adolescents or adult males who have no symptoms. Screening includes self-exam, a health care provider exam, and other screening tests. Consult with your health care provider about any symptoms you have or any concerns you have about testicular cancer.  Practice safe sex. Use condoms and avoid high-risk sexual practices to reduce the spread of sexually transmitted infections (STIs).  You should be screened for STIs, including gonorrhea and chlamydia if:  You are sexually active and are younger than 24 years.  You are older than 24 years, and your health care  provider tells you that you are at risk for this type of infection.  Your sexual activity has changed since you were last screened, and you are at an increased risk for chlamydia or gonorrhea. Ask your health care provider if you are at risk.  If you are at risk of being infected with HIV, it is recommended that you take a prescription medicine daily to prevent HIV infection. This is called pre-exposure prophylaxis (PrEP). You are considered at risk if:  You are a man who has sex with other men (MSM).  You are a heterosexual man who is sexually active with multiple partners.  You take drugs by injection.  You are sexually active with a partner who has HIV.  Talk with your health care provider about whether you are at high risk of being infected with HIV. If you choose to begin PrEP, you should first be tested for HIV. You should then be tested every 3 months for as long as you are taking PrEP.  Use sunscreen. Apply sunscreen liberally and repeatedly throughout the day. You should seek shade when your shadow is shorter than you. Protect yourself by wearing long sleeves, pants, a wide-brimmed hat, and sunglasses year round whenever you are outdoors.  Tell your health care provider of new moles or changes in moles, especially if there is a change in shape or color. Also, tell your health care provider if a mole is larger than the size of a pencil eraser.  A one-time screening for abdominal aortic aneurysm (AAA) and surgical repair of large AAAs by ultrasound is recommended for men aged 67-75 years who are current or former smokers.  Stay current with your vaccines (immunizations). Document Released: 01/30/2008 Document Revised: 08/08/2013 Document Reviewed: 12/29/2010 [facility] Patient Information 2015 [place], [place]. This information is not intended to replace advice given to you by your health care provider. Make sure you discuss any questions you have with your health care  provider.   Advance Directive Advance directives are the legal documents that allow you to make choices about your health care and medical treatment if you cannot speak for yourself. Advance directives are a way for you to communicate your wishes to family, friends, and health care providers. The specified people can then convey your decisions about end-of-life care to avoid confusion if you should become unable to communicate. Ideally, the process of discussing and writing advance directives should happen over time rather than making decisions all at once. Advance directives can be modified as your situation changes,  and you can change your mind at any time, even after you have signed the advance directives. Each state has its own laws regarding advance directives. You may want to check with your health care provider, attorney, or state representative about the law in your state. Below are some examples of advance directives. LIVING WILL A living will is a set of instructions documenting your wishes about medical care when you cannot care for yourself. It is used if you become:  Terminally ill.  Incapacitated.  Unable to communicate.  Unable to make decisions. Items to consider in your living will include:  The use or non-use of life-sustaining equipment, such as dialysis machines and breathing machines (ventilators).  A do not resuscitate (DNR) order, which is the instruction not to use cardiopulmonary resuscitation (CPR) if breathing or heartbeat stops.  Tube feeding.  Withholding of food and fluids.  Comfort (palliative) care when the goal becomes comfort rather than a cure.  Organ and tissue donation. A living will does not give instructions about distribution of your money and property if you should pass away. It is advisable to seek the expert advice of a lawyer in drawing up a will regarding your possessions. Decisions about taxes, beneficiaries, and asset distribution will be  legally binding. This process can relieve your family and friends of any burdens surrounding disputes or questions that may come up about the allocation of your assets. DO NOT RESUSCITATE (DNR) A do not resuscitate (DNR) order is a request to not have CPR in the event that your heart stops beating or you stop breathing. Unless given other instructions, a health care provider will try to help any patient whose heart has stopped or who has stopped breathing.  HEALTH CARE PROXY AND DURABLE POWER OF ATTORNEY FOR HEALTH CARE A health care proxy is a person (agent) appointed to make medical decisions for you if you cannot. Generally, people choose someone they know well and trust to represent their preferences when they can no longer do so. You should be sure to ask this person for agreement to act as your agent. An agent may have to exercise judgment in the event of a medical decision for which your wishes are not known. The durable power of attorney for health care is the legal document that names your health care proxy. Once written, it should be:  Signed.  Notarized.  Dated.  Copied.  Witnessed.  Incorporated into your medical record. You may also want to appoint someone to manage your financial affairs if you cannot. This is called a durable power of attorney for finances. It is a separate legal document from the durable power of attorney for health care. You may choose the same person or someone different from your health care proxy to act as your agent in financial matters. Document Released: 11/10/2007 Document Revised: 08/08/2013 Document Reviewed: 12/21/2012 [facility] Patient Information 2015 [place], [place]. This information is not intended to replace advice given to you by your health care provider. Make sure you discuss any questions you have with your health care provider.

## 2015-04-18 NOTE — Assessment & Plan Note (Signed)
Reviewed and updated patient's medical, surgical, family and social history. Medications and allergies were also reviewed. Basic screenings for depression, activities of daily living, hearing, cognition and safety were performed. Provider list was updated and health plan was provided to the patient.  

## 2015-04-23 ENCOUNTER — Encounter: Payer: Self-pay | Admitting: Family

## 2015-05-07 NOTE — Telephone Encounter (Signed)
Please inform patient that his blood work shows that your kidney function, liver function, electrolytes, thyroid, prostate and white/red blood cells are within the normal limits. Your good cholesterol is slightly low increasing your risk for cardiovascular disease. Based on the current guidelines and calculations, your risk for coronary artery disease is 22% in the next 7.5 years indicating the need for cholesterol medication to help reduce your risk. If you are willing, I'd like to start you on cholesterol medication either atorvastatin or simvastatin. You can also work on your nutritional intake as well as described below.

## 2015-09-04 ENCOUNTER — Ambulatory Visit (INDEPENDENT_AMBULATORY_CARE_PROVIDER_SITE_OTHER): Payer: Medicare Other | Admitting: Physician Assistant

## 2015-09-04 VITALS — BP 124/66 | HR 64 | Temp 98.6°F | Resp 16 | Ht 64.0 in | Wt 178.0 lb

## 2015-09-04 DIAGNOSIS — B9789 Other viral agents as the cause of diseases classified elsewhere: Secondary | ICD-10-CM

## 2015-09-04 DIAGNOSIS — R05 Cough: Secondary | ICD-10-CM

## 2015-09-04 DIAGNOSIS — J029 Acute pharyngitis, unspecified: Secondary | ICD-10-CM | POA: Diagnosis not present

## 2015-09-04 DIAGNOSIS — R059 Cough, unspecified: Secondary | ICD-10-CM

## 2015-09-04 DIAGNOSIS — J069 Acute upper respiratory infection, unspecified: Secondary | ICD-10-CM | POA: Diagnosis not present

## 2015-09-04 LAB — POCT RAPID STREP A (OFFICE): RAPID STREP A SCREEN: NEGATIVE

## 2015-09-04 MED ORDER — BENZONATATE 100 MG PO CAPS
100.0000 mg | ORAL_CAPSULE | Freq: Three times a day (TID) | ORAL | Status: DC | PRN
Start: 2015-09-04 — End: 2016-04-21

## 2015-09-04 MED ORDER — IPRATROPIUM BROMIDE 0.03 % NA SOLN: 2.0000 | Freq: Two times a day (BID) | NASAL | Status: DC

## 2015-09-04 NOTE — Progress Notes (Signed)
Subjective:    Patient ID: Clifford Clos., male    DOB: Jun 08, 1946, 70 y.o.   MRN: [id number]  Chief Complaint  Patient presents with  . Sinusitis  . Cough    productive, 3 days, green phlegm  . Nasal Congestion  . Ear Fullness    bilateral  . Generalized Body Aches  . Sore Throat   HPI Patient presents today for a 3 day hx of cough with green phlegm and associated generalized body aches, b/l ear fullness, sinus pressure, and sore throat. 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 He also reports a history of strep throat. He states he first started noticing feeling achy around 10pm Monday night. When he woke up on Tuesday the generalized ache's continued and his associated symptoms developed gradually. Additional associated symptoms include fatigue, myalgias, arthralgias, lightheadedness, watery d/c from the eyes, rhinorrhea, PND, and sneezing. He notes relief with OTC allergy relief medication, 800mg  of Ibuprofen and 2oz Nyquil. He denies fever, night sweats, chills, eye pain or itching, ear pain or itching, dysphagia, odynophagia, chest pain, and N/V/D. He reports no hx of seasonal or environmental allergies, atopy, eczema, or asthma. The patient received his influenza vaccine on 06/18/15.   Review of Systems  Constitutional: Negative for fever, chills and fatigue.  HENT: Positive for congestion, postnasal drip, rhinorrhea, sinus pressure, sneezing and sore throat. Negative for ear discharge, hearing loss and trouble swallowing. Ear pain: ear fullness.   Eyes: Positive for discharge (watery). Negative for pain, redness and itching.  Respiratory: Positive for cough and chest tightness (with the cough). Negative for shortness of breath. Wheezing: possibly.   Cardiovascular: Negative for chest pain and palpitations.  Gastrointestinal: Negative for nausea, vomiting and abdominal pain.    Musculoskeletal: Positive for myalgias and arthralgias. Negative for neck pain.  Skin: Negative.   Allergic/Immunologic: Negative.   Neurological: Positive for light-headedness (after coughing).   No Known Allergies  Prior to Admission medications   Medication Sig Start Date End Date Taking? Authorizing Provider  acetaminophen (TYLENOL) 500 MG tablet Take 500 mg by mouth every 6 (six) hours as needed for pain.   Yes Historical Provider, MD  IBUPROFEN PO Take by mouth.   Yes Historical Provider, MD   Patient Active Problem List   Diagnosis Date Noted  . Sore throat 09/04/2015  . Cough 09/04/2015  . Right shoulder pain 09/25/2014  . 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 He appears well-developed and well-nourished.  BP 124/66 mmHg  Pulse 64  Temp(Src) 98.6 F (37 C) (Oral)  Resp 16  Ht 5\' 4"  (1.626 m)  Wt 178 lb (80.74 kg)  BMI 30.54 kg/m2  SpO2 96%   HENT:  Head: Normocephalic and atraumatic.  Right Ear: Hearing, tympanic membrane, external ear and ear canal normal. No tenderness.  Left Ear: Hearing, tympanic membrane, external ear and ear canal normal. No tenderness.  Nose: Rhinorrhea present. No mucosal edema, nose lacerations, sinus tenderness, nasal deformity, septal deviation or nasal septal hematoma. No epistaxis.  No foreign bodies. Right sinus exhibits no maxillary sinus tenderness and no frontal sinus tenderness. Left sinus exhibits no maxillary sinus tenderness and no frontal sinus tenderness.  Mouth/Throat: Uvula is midline and mucous membranes are normal. Posterior oropharyngeal erythema present. No oropharyngeal exudate, posterior oropharyngeal edema or  tonsillar abscesses.  Eyes: Conjunctivae and lids are normal. Pupils are equal, round, and reactive to light. Right eye exhibits no discharge. Left eye exhibits no discharge.  Neck: Normal range of motion. Neck supple.  Cardiovascular: Normal rate and  regular rhythm.  Exam reveals no gallop and no friction rub.   Murmur heard. Pulmonary/Chest: Effort normal and breath sounds normal.  Musculoskeletal:       Cervical back: He exhibits normal range of motion and no tenderness.  Lymphadenopathy:       Head (right side): No submental, no submandibular, no tonsillar, no preauricular, no posterior auricular and no occipital adenopathy present.       Head (left side): No submental, no submandibular, no tonsillar, no preauricular, no posterior auricular and no occipital adenopathy present.    He has no cervical adenopathy.  Neurological: He is alert.  Skin: Skin is warm and dry. No rash noted.  Psychiatric: He has a normal mood and affect. His behavior is normal.  Vitals reviewed.  Results for orders placed or performed in visit on 09/04/15  POCT rapid strep A  Result Value Ref Range   Rapid Strep A Screen Negative Negative       Assessment & Plan:  1. Sore throat - POCT rapid strep A - Culture, Group A Strep  2. Cough 3. Viral URI with cough - ipratropium (ATROVENT) 0.03 % nasal spray; Place 2 sprays into both nostrils 2 (two) times daily.  Dispense: 30 mL; Refill: 0 - benzonatate (TESSALON) 100 MG capsule; Take 1-2 capsules (100-200 mg total) by mouth 3 (three) times daily as needed for cough.  Dispense: 40 capsule; Refill: 0  Return if symptoms worsen or fail to improve.

## 2015-09-04 NOTE — Progress Notes (Signed)
Patient ID: Clifford Gould., male    DOB: 05-27-1946, 70 y.o.   MRN: [id number]  PCP: Kennyth Arnold, FNP  Subjective:   Chief Complaint  Patient presents with  . Sinusitis  . Cough    productive, 3 days, green phlegm  . Nasal Congestion  . Ear Fullness    bilateral  . Generalized Body Aches  . Sore Throat    HPI Presents for evaluation of a 3 day hx of cough with green phlegm and associated generalized body aches, b/l ear fullness, sinus pressure, and sore throat.   He is concerned that he may have caught an illness after working with a patient. 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 He also reports a history of strep throat.   He states he first started noticing feeling achy around 10pm Monday (09/02/15) night. When he woke up on Tuesday the generalized aches continued and his associated symptoms developed gradually. Additional associated symptoms include fatigue, myalgias, arthralgias, lightheadedness, watery d/c from the eyes, rhinorrhea, PND, and sneezing. He notes relief with OTC allergy relief medication, 800mg  of Ibuprofen and 2oz Nyquil.   He denies fever, night sweats, chills, eye pain or itching, ear pain or itching, dysphagia, odynophagia, chest pain, and N/V/D.   He reports no hx of seasonal or environmental allergies, atopy, eczema, or asthma.   The patient received his influenza vaccine on 06/18/15. .   Review of Systems Constitutional: Negative for fever, chills and fatigue.  HENT: Positive for congestion, postnasal drip, rhinorrhea, sinus pressure, sneezing and sore throat. Negative for ear discharge, hearing loss and trouble swallowing. Ear pain: ear fullness.  Eyes: Positive for discharge (watery). Negative for pain, redness and itching.  Respiratory: Positive for cough and chest tightness (with the cough). Negative for shortness of breath. Wheezing: possibly.  Cardiovascular: Negative for chest pain and palpitations.  Gastrointestinal: Negative for  nausea, vomiting and abdominal pain.  Musculoskeletal: Positive for myalgias and arthralgias. Negative for neck pain.  Skin: Negative.  Allergic/Immunologic: Negative.  Neurological: Positive for light-headedness (after coughing).     Patient Active Problem List   Diagnosis Date Noted  . Right shoulder pain 09/25/2014  . 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 Authorizing Provider  acetaminophen (TYLENOL) 500 MG tablet Take 500 mg by mouth every 6 (six) hours as needed for pain.   Yes Historical Provider, MD  IBUPROFEN PO Take by mouth.   Yes Historical Provider, MD  Potassium Gluconate 595 MG CAPS Take 1 capsule by mouth daily. Reported on 09/04/2015    Historical Provider, MD  sildenafil (REVATIO) 20 MG tablet Take 40 mg by mouth daily as needed. Reported on 09/04/2015    Historical Provider, MD  valACYclovir (VALTREX) 1000 MG tablet Take 1 tablet (1,000 mg total) by mouth 2 (two) times daily. Patient not taking: Reported on 09/04/2015 04/16/14   Kennyth Arnold, FNP     No Known Allergies     Objective:  Physical Exam  Constitutional: He is oriented to person, place, and time. Vital signs are normal. He appears well-developed and well-nourished. He is active and cooperative. No distress.  BP 124/66 mmHg  Pulse 64  Temp(Src) 98.6 F (37 C) (Oral)  Resp 16  Ht 5\' 4"  (1.626 m)  Wt 178 lb (80.74 kg)  BMI 30.54 kg/m2  SpO2 96%  HENT:  Head: Normocephalic and atraumatic.  Right Ear: Hearing normal.  Left Ear: Hearing normal.  Mouth/Throat: Uvula is midline, oropharynx is clear and moist and mucous membranes are normal. No oral lesions. Normal dentition.  Eyes: Conjunctivae, EOM and lids are normal. Pupils are equal, round, and reactive to light. No scleral icterus.  Neck: Normal range of motion, full passive range of motion without pain and phonation normal. Neck supple. No thyromegaly present.    Cardiovascular: Normal rate, regular rhythm and normal heart sounds.   Pulses:      Radial pulses are 2+ on the right side, and 2+ on the left side.  Pulmonary/Chest: Effort normal and breath sounds normal.  Lymphadenopathy:       Head (right side): No tonsillar, no preauricular, no posterior auricular and no occipital adenopathy present.       Head (left side): No tonsillar, no preauricular, no posterior auricular and no occipital adenopathy present.    He has no cervical adenopathy.       Right: No supraclavicular adenopathy present.       Left: No supraclavicular adenopathy present.  Neurological: He is alert and oriented to person, place, and time. No sensory deficit.  Skin: Skin is warm, dry and intact. No rash noted. No cyanosis or erythema. Nails show no clubbing.  Psychiatric: He has a normal mood and affect. His speech is normal and behavior is normal.    Results for orders placed or performed in visit on 09/04/15  POCT rapid strep A  Result Value Ref Range   Rapid Strep A Screen Negative Negative          Assessment & Plan:   1. Sore throat 2. Viral URI with cough Supportive care.  Anticipatory guidance.  RTC if symptoms worsen/persist. - POCT rapid strep A - Culture, Group A Strep - ipratropium (ATROVENT) 0.03 % nasal spray; Place 2 sprays into both nostrils 2 (two) times daily.  Dispense: 30 mL; Refill: 0 - benzonatate (TESSALON) 100 MG capsule; Take 1-2 capsules (100-200 mg total) by mouth 3 (three) times daily as needed for cough.  Dispense: 40 capsule; Refill: 0   Fara Chute, PA-C Physician Assistant-Certified [facility]

## 2015-09-04 NOTE — Patient Instructions (Signed)
Get plenty of rest and drink at least 64 ounces of water daily. 

## 2015-09-06 LAB — CULTURE, GROUP A STREP: ORGANISM ID, BACTERIA: NORMAL

## 2015-09-07 NOTE — Addendum Note (Signed)
Addended by: Roselee Culver on: 09/07/2015 11:03 AM   Modules accepted: Miquel Dunn

## 2015-09-07 NOTE — Progress Notes (Signed)
  Medical screening examination/treatment/procedure(s) were performed by non-physician practitioner and as supervising physician I was immediately available for consultation/collaboration.     

## 2016-04-21 ENCOUNTER — Encounter: Payer: Self-pay | Admitting: Family

## 2016-04-21 ENCOUNTER — Ambulatory Visit (INDEPENDENT_AMBULATORY_CARE_PROVIDER_SITE_OTHER): Payer: Medicare Other | Admitting: Family

## 2016-04-21 ENCOUNTER — Other Ambulatory Visit (INDEPENDENT_AMBULATORY_CARE_PROVIDER_SITE_OTHER): Payer: Medicare Other

## 2016-04-21 VITALS — BP 124/76 | HR 59 | Temp 98.5°F | Resp 16 | Ht 64.0 in | Wt 179.0 lb

## 2016-04-21 DIAGNOSIS — Z0000 Encounter for general adult medical examination without abnormal findings: Secondary | ICD-10-CM | POA: Diagnosis not present

## 2016-04-21 DIAGNOSIS — Z125 Encounter for screening for malignant neoplasm of prostate: Secondary | ICD-10-CM | POA: Diagnosis not present

## 2016-04-21 DIAGNOSIS — E669 Obesity, unspecified: Secondary | ICD-10-CM | POA: Insufficient documentation

## 2016-04-21 LAB — LIPID PANEL
CHOL/HDL RATIO: 6
Cholesterol: 201 mg/dL — ABNORMAL HIGH (ref 0–200)
HDL: 35.7 mg/dL — ABNORMAL LOW (ref >39.00)
NONHDL: 165.46
TRIGLYCERIDES: 312 mg/dL — AB (ref 0.0–149.0)
VLDL: 62.4 mg/dL — ABNORMAL HIGH (ref 0.0–40.0)

## 2016-04-21 LAB — COMPREHENSIVE METABOLIC PANEL
ALK PHOS: 64 U/L (ref 39–117)
ALT: 18 U/L (ref 0–53)
AST: 21 U/L (ref 0–37)
Albumin: 4.1 g/dL (ref 3.5–5.2)
BILIRUBIN TOTAL: 0.4 mg/dL (ref 0.2–1.2)
BUN: 24 mg/dL — ABNORMAL HIGH (ref 6–23)
CO2: 26 mEq/L (ref 19–32)
CREATININE: 1.29 mg/dL (ref 0.40–1.50)
Calcium: 9.2 mg/dL (ref 8.4–10.5)
Chloride: 107 mEq/L (ref 96–112)
GFR: 58.65 mL/min — AB (ref >60.00)
GLUCOSE: 112 mg/dL — AB (ref 70–99)
Potassium: 4.9 mEq/L (ref 3.5–5.1)
Sodium: 139 mEq/L (ref 135–145)
TOTAL PROTEIN: 7 g/dL (ref 6.0–8.3)

## 2016-04-21 LAB — PSA, MEDICARE: PSA: 2.7 ng/mL (ref 0.10–4.00)

## 2016-04-21 LAB — CBC
HCT: 41.9 % (ref 39.0–52.0)
HEMOGLOBIN: 14.5 g/dL (ref 13.0–17.0)
MCHC: 34.6 g/dL (ref 30.0–36.0)
MCV: 94.8 fl (ref 78.0–100.0)
PLATELETS: 385 10*3/uL (ref 150.0–400.0)
RBC: 4.42 Mil/uL (ref 4.22–5.81)
RDW: 12.9 % (ref 11.5–15.5)
WBC: 8.4 10*3/uL (ref 4.0–10.5)

## 2016-04-21 LAB — LDL CHOLESTEROL, DIRECT: LDL DIRECT: 99 mg/dL

## 2016-04-21 LAB — HEMOGLOBIN A1C: Hgb A1c MFr Bld: 6.1 % (ref 4.6–6.5)

## 2016-04-21 NOTE — Assessment & Plan Note (Signed)
Reviewed and updated patient's medical, surgical, family and social history. Medications and allergies were also reviewed. Basic screenings for depression, activities of daily living, hearing, cognition and safety were performed. Provider list was updated and health plan was provided to the patient.  

## 2016-04-21 NOTE — Patient Instructions (Addendum)
Thank you for choosing [facility].  SUMMARY AND INSTRUCTIONS:   Labs:  Please stop by the lab on the lower level of the building for your blood work. Your results will be released to [place] (or called to you) after review, usually within 72 hours after test completion. If any changes need to be made, you will be notified at that same time.  1.) The lab is open from 7:30am to 5:30 pm Monday-Friday 2.) No appointment is necessary 3.) Fasting (if needed) is 6-8 hours after food and drink; black coffee and water are okay   Follow up:  If your symptoms worsen or fail to improve, please contact our office for further instruction, or in case of emergency go directly to the emergency room at the closest medical facility.   Health Maintenance  Topic Date Due  . Hepatitis C Screening  1946-03-09  . INFLUENZA VACCINE  10/01/2016 (Originally 03/17/2016)  . ZOSTAVAX  04/19/2017 (Originally 01/08/2007)  . TETANUS/TDAP  12/23/2021  . COLONOSCOPY  04/26/2023    Health Maintenance, Male A healthy lifestyle and preventative care can promote health and wellness.  Maintain regular health, dental, and eye exams.  Eat a healthy diet. Foods like vegetables, fruits, whole grains, low-fat dairy products, and lean protein foods contain the nutrients you need and are low in calories. Decrease your intake of foods high in solid fats, added sugars, and salt. Get information about a proper diet from your health care provider, if necessary.  Regular physical exercise is one of the most important things you can do for your health. Most adults should get at least 150 minutes of moderate-intensity exercise (any activity that increases your heart rate and causes you to sweat) each week. In addition, most adults need muscle-strengthening exercises on 2 or more days a week.   Maintain a healthy weight. The body mass index (BMI) is a screening tool to identify possible weight problems. It provides an estimate of  body fat based on height and weight. Your health care provider can find your BMI and can help you achieve or maintain a healthy weight. For males 20 years and older:  A BMI below 18.5 is considered underweight.  A BMI of 18.5 to 24.9 is normal.  A BMI of 25 to 29.9 is considered overweight.  A BMI of 30 and above is considered obese.  Maintain normal blood lipids and cholesterol by exercising and minimizing your intake of saturated fat. Eat a balanced diet with plenty of fruits and vegetables. Blood tests for lipids and cholesterol should begin at age 19 and be repeated every 5 years. If your lipid or cholesterol levels are high, you are over age 30, or you are at high risk for heart disease, you may need your cholesterol levels checked more frequently.Ongoing high lipid and cholesterol levels should be treated with medicines if diet and exercise are not working.  If you smoke, find out from your health care provider how to quit. If you do not use tobacco, do not start.  Lung cancer screening is recommended for adults aged 66-80 years who are at high risk for developing lung cancer because of a history of smoking. A yearly low-dose CT scan of the lungs is recommended for people who have at least a 30-pack-year history of smoking and are current smokers or have quit within the past 15 years. A pack year of smoking is smoking an average of 1 pack of cigarettes a day for 1 year (for example, a 30-pack-year  history of smoking could mean smoking 1 pack a day for 30 years or 2 packs a day for 15 years). Yearly screening should continue until the smoker has stopped smoking for at least 15 years. Yearly screening should be stopped for people who develop a health problem that would prevent them from having lung cancer treatment.  If you choose to drink alcohol, do not have more than 2 drinks per day. One drink is considered to be 12 oz (360 mL) of beer, 5 oz (150 mL) of wine, or 1.5 oz (45 mL) of  liquor.  Avoid the use of street drugs. Do not share needles with anyone. Ask for help if you need support or instructions about stopping the use of drugs.  High blood pressure causes heart disease and increases the risk of stroke. High blood pressure is more likely to develop in:  People who have blood pressure in the end of the normal range (100-139/85-89 mm Hg).  People who are overweight or obese.  People who are African American.  If you are 20-27 years of age, have your blood pressure checked every 3-5 years. If you are 53 years of age or older, have your blood pressure checked every year. You should have your blood pressure measured twice--once when you are at a hospital or clinic, and once when you are not at a hospital or clinic. Record the average of the two measurements. To check your blood pressure when you are not at a hospital or clinic, you can use:  An automated blood pressure machine at a pharmacy.  A home blood pressure monitor.  If you are 73-54 years old, ask your health care provider if you should take aspirin to prevent heart disease.  Diabetes screening involves taking a blood sample to check your fasting blood sugar level. This should be done once every 3 years after age 44 if you are at a normal weight and without risk factors for diabetes. Testing should be considered at a younger age or be carried out more frequently if you are overweight and have at least 1 risk factor for diabetes.  Colorectal cancer can be detected and often prevented. Most routine colorectal cancer screening begins at the age of 99 and continues through age 54. However, your health care provider may recommend screening at an earlier age if you have risk factors for colon cancer. On a yearly basis, your health care provider may provide home test kits to check for hidden blood in the stool. A small camera at the end of a tube may be used to directly examine the colon (sigmoidoscopy or colonoscopy)  to detect the earliest forms of colorectal cancer. Talk to your health care provider about this at age 46 when routine screening begins. A direct exam of the colon should be repeated every 5-10 years through age 8, unless early forms of precancerous polyps or small growths are found.  People who are at an increased risk for hepatitis B should be screened for this virus. You are considered at high risk for hepatitis B if:  You were born in a country where hepatitis B occurs often. Talk with your health care provider about which countries are considered high risk.  Your parents were born in a high-risk country and you have not received a shot to protect against hepatitis B (hepatitis B vaccine).  You have HIV or AIDS.  You use needles to inject street drugs.  You live with, or have sex with, someone who has  hepatitis B.  You are a man who has sex with other men (MSM).  You get hemodialysis treatment.  You take certain medicines for conditions like cancer, organ transplantation, and autoimmune conditions.  Hepatitis C blood testing is recommended for all people born from [birth year] through 1964 and any individual with known risk factors for hepatitis C.  Healthy men should no longer receive prostate-specific antigen (PSA) blood tests as part of routine cancer screening. Talk to your health care provider about prostate cancer screening.  Testicular cancer screening is not recommended for adolescents or adult males who have no symptoms. Screening includes self-exam, a health care provider exam, and other screening tests. Consult with your health care provider about any symptoms you have or any concerns you have about testicular cancer.  Practice safe sex. Use condoms and avoid high-risk sexual practices to reduce the spread of sexually transmitted infections (STIs).  You should be screened for STIs, including gonorrhea and chlamydia if:  You are sexually active and are younger than 24  years.  You are older than 24 years, and your health care provider tells you that you are at risk for this type of infection.  Your sexual activity has changed since you were last screened, and you are at an increased risk for chlamydia or gonorrhea. Ask your health care provider if you are at risk.  If you are at risk of being infected with HIV, it is recommended that you take a prescription medicine daily to prevent HIV infection. This is called pre-exposure prophylaxis (PrEP). You are considered at risk if:  You are a man who has sex with other men (MSM).  You are a heterosexual man who is sexually active with multiple partners.  You take drugs by injection.  You are sexually active with a partner who has HIV.  Talk with your health care provider about whether you are at high risk of being infected with HIV. If you choose to begin PrEP, you should first be tested for HIV. You should then be tested every 3 months for as long as you are taking PrEP.  Use sunscreen. Apply sunscreen liberally and repeatedly throughout the day. You should seek shade when your shadow is shorter than you. Protect yourself by wearing long sleeves, pants, a wide-brimmed hat, and sunglasses year round whenever you are outdoors.  Tell your health care provider of new moles or changes in moles, especially if there is a change in shape or color. Also, tell your health care provider if a mole is larger than the size of a pencil eraser.  A one-time screening for abdominal aortic aneurysm (AAA) and surgical repair of large AAAs by ultrasound is recommended for men aged 35-75 years who are current or former smokers.  Stay current with your vaccines (immunizations).   This information is not intended to replace advice given to you by your health care provider. Make sure you discuss any questions you have with your health care provider.   Document Released: 01/30/2008 Document Revised: 08/24/2014 Document Reviewed:  12/29/2010 Elsevier Interactive Patient Education [employer].

## 2016-04-21 NOTE — Assessment & Plan Note (Signed)
BMI of 30. Recommend weight loss of 5-10% of current body weight. Recommend increasing physical activity to 30 minutes of moderate level activity daily. Encourage nutritional intake that focuses on nutrient dense foods and is moderate, varied, and balanced and is low in saturated fats and processed/sugary foods. Continue to monitor.  

## 2016-04-21 NOTE — Assessment & Plan Note (Signed)
1) Anticipatory Guidance: Discussed importance of wearing a seatbelt while driving and not texting while driving; changing batteries in smoke detector at least once annually; wearing suntan lotion when outside; eating a balanced and moderate diet; getting physical activity at least 30 minutes per day.  2) Immunizations / Screenings / Labs:  Declines influenza and Zostavax. All other immunizations are up to date per recommendations. Obtain PSA for prostate cancer screening. Obtain methylmalonic acid for B12 screen. Obtian Hepatitis C antibody for Hepatitis C screening.  Obtain A1c for diabetes screening. Due for a dental exam encouraged to be completed independently. Obtain CBC, CMET, and lipid profile.    Overall well exam with risk factors for cardiovascular disease including overweight/obesity and elevated cholesterol.Recommend weight loss of 5-10% of current body weight. Recommend increasing physical activity to 30 minutes of moderate level activity daily. Encourage nutritional intake that focuses on nutrient dense foods and is moderate, varied, and balanced and is low in saturated fats and processed/sugary foods.  Recheck cholesterol and recalculate ASCVD risk for need for cholesterol-lowering medication if applicable. Continue other healthy lifestyle behaviors and choices. Follow-up prevention exam in 1 year. Follow-up office visit pending blood work as necessary.

## 2016-04-21 NOTE — Progress Notes (Signed)
Subjective:    Patient ID: Clifford Clos., male    DOB: 03/27/1946, 70 y.o.   MRN: [id number]  Chief Complaint  Patient presents with  . CPE    fasting    HPI:  Clifford Mcrill. is a 70 y.o. male who presents today for a Medicare Annual Wellness/Physical exam.    1) Health Maintenance -   Diet - Averages about 2 meals per day and snacks consisting of a regular diet; 1-2 cups of caffeine daily; Limited fast/processed foods  Exercise - Walking the dog; no structured exercise  2) Preventative [place] / Immunizations:  Dental -- Due for exam  Vision -- Up to date   Health Maintenance  Topic Date Due  . Hepatitis C Screening  [birth year]  . INFLUENZA VACCINE  10/01/2016 (Originally 03/17/2016)  . ZOSTAVAX  04/19/2017 (Originally 01/08/2007)  . TETANUS/TDAP  12/23/2021  . COLONOSCOPY  04/26/2023     Immunization History  Administered Date(s) Administered  . Pneumococcal Conjugate-13 04/16/2014  . Pneumococcal Polysaccharide-23 12/24/2011  . Tetanus 12/24/2011    RISK FACTORS  Tobacco History  Smoking Status  . Never Smoker  Smokeless Tobacco  . Never Used     Cardiac risk factors: advanced age (older than 79 for men, 71 for women), dyslipidemia and male gender.  Depression Screen  Depression screen [facility] 2/9 04/21/2016  Decreased Interest 0  Down, Depressed, Hopeless 0  PHQ - 2 Score 0     Activities of Daily Living In your present state of health, do you have any difficulty performing the following activities?:  Driving? No Managing money?  No Feeding yourself? No Getting from bed to chair? No Climbing a flight of stairs? No Preparing food and eating?: No Bathing or showering? No Getting dressed: No Getting to the toilet? No Using the toilet: No Moving around from place to place: No In the past year have you fallen or had a near fall?:No   Home Safety Has smoke detector and wears seat belts. No excess sun exposure. Are there smokers in  your home (other than you)?  No Do you feel safe at home?  Yes  Hearing Difficulties: No Do you often ask people to speak up or repeat themselves? No Do you experience ringing or noises in your ears? No  Do you have difficulty understanding soft or whispered voices? No    Cognitive Testing  Alert? Yes   Normal Appearance? Yes  Oriented to person? Yes  Place? Yes   Time? Yes  Recall of three objects?  Yes  Can perform simple calculations? Yes  Displays appropriate judgment? Yes  Can read the correct time from a watch face? Yes  Do you feel that you have a problem with memory? No  Do you often misplace items? No   Advanced Directives have been discussed with the patient? Yes   Current Physicians/Providers and Suppliers  1. Terri Piedra, FNP - Internal Medicine  Indicate any recent Medical Services you may have received from other than [facility] providers in the past year (date may be approximate).  All answers were reviewed with the patient and necessary referrals were made:  Mauricio Po, [place]   04/21/2016    No Known Allergies   Outpatient Medications Prior to Visit  Medication Sig Dispense Refill  . acetaminophen (TYLENOL) 500 MG tablet Take 500 mg by mouth every 6 (six) hours as needed for pain.    . IBUPROFEN PO Take by mouth.    . benzonatate (  TESSALON) 100 MG capsule Take 1-2 capsules (100-200 mg total) by mouth 3 (three) times daily as needed for cough. 40 capsule 0  . ipratropium (ATROVENT) 0.03 % nasal spray Place 2 sprays into both nostrils 2 (two) times daily. 30 mL 0   No facility-administered medications prior to visit.      Past Medical History:  Diagnosis Date  . Chicken pox   . Erectile dysfunction   . Heart murmur   . Herpes simplex   . Hyperlipidemia    last year 2013 elevated now normal     Past Surgical History:  Procedure Laterality Date  . COLONOSCOPY     15+yrs ago- normal per pt.  . SPLENECTOMY       Family History  Problem Relation  Age of Onset  . Pancreatic cancer Mother     pancreatic  . Diabetes Father   . Colon cancer Neg Hx      Social History   Social History  . Marital status: Married    Spouse name: N/A  . Number of children: 3  . Years of education: 43   Occupational History  . Nurse Aide     Social History Main Topics  . Smoking status: Never Smoker  . Smokeless tobacco: Never Used  . Alcohol use 1.2 - 2.4 oz/week    2 - 4 Cans of beer per week     Comment: occ beer  . Drug use: No  . Sexual activity: Not on file   Other Topics Concern  . Not on file   Social History Narrative   Born and raised in [place], [place]. Currently resides in a house with his wife. 2 dogs (1 boxer, half-lab/pitbull). Fun: Play golf, tennis and read.    Denies religious beliefs that would effect health care.      Review of Systems  Constitutional: Denies fever, chills, fatigue, or significant weight gain/loss. HENT: Head: Denies headache or neck pain Ears: Denies changes in hearing, ringing in ears, earache, drainage Nose: Denies discharge, stuffiness, itching, nosebleed, sinus pain Throat: Denies sore throat, hoarseness, dry mouth, sores, thrush Eyes: Denies loss/changes in vision, pain, redness, blurry/double vision, flashing lights Cardiovascular: Denies chest pain/discomfort, tightness, palpitations, shortness of breath with activity, difficulty lying down, swelling, sudden awakening with shortness of breath Respiratory: Denies shortness of breath, cough, sputum production, wheezing Gastrointestinal: Denies dysphasia, heartburn, change in appetite, nausea, change in bowel habits, rectal bleeding, constipation, diarrhea, yellow skin or eyes Genitourinary: Denies frequency, urgency, burning/pain, blood in urine, incontinence, change in urinary strength. Musculoskeletal: Denies muscle/joint pain, stiffness, back pain, redness or swelling of joints, trauma Skin: Denies rashes, lumps, itching, dryness, color  changes, or hair/nail changes Neurological: Denies dizziness, fainting, seizures, weakness, numbness, tingling, tremor Psychiatric - Denies nervousness, stress, depression or memory loss Endocrine: Denies heat or cold intolerance, sweating, frequent urination, excessive thirst, changes in appetite Hematologic: Denies ease of bruising or bleeding    Objective:     BP 124/76 (BP Location: Left Arm, Patient Position: Sitting, Cuff Size: Normal)   Pulse (!) 59   Temp 98.5 F (36.9 C) (Oral)   Resp 16   Ht 5\' 4"  (1.626 m)   Wt 179 lb (81.2 kg)   SpO2 94%   BMI 30.73 kg/m  Nursing note and vital signs reviewed.  Physical Exam  Constitutional: He is oriented to person, place, and time. He appears well-developed and well-nourished.  HENT:  Head: Normocephalic.  Right Ear: Hearing, tympanic membrane, external ear and ear  canal normal.  Left Ear: Hearing, tympanic membrane, external ear and ear canal normal.  Nose: Nose normal.  Mouth/Throat: Uvula is midline, oropharynx is clear and moist and mucous membranes are normal.  Eyes: Conjunctivae and EOM are normal. Pupils are equal, round, and reactive to light.  Neck: Neck supple. No JVD present. No tracheal deviation present. No thyromegaly present.  Cardiovascular: Normal rate, regular rhythm, normal heart sounds and intact distal pulses.   Pulmonary/Chest: Effort normal and breath sounds normal.  Abdominal: Soft. Bowel sounds are normal. He exhibits no distension and no mass. There is no tenderness. There is no rebound and no guarding.  Musculoskeletal: Normal range of motion. He exhibits no edema or tenderness.  Lymphadenopathy:    He has no cervical adenopathy.  Neurological: He is alert and oriented to person, place, and time. He has normal reflexes. No cranial nerve deficit. He exhibits normal muscle tone. Coordination normal.  Skin: Skin is warm and dry.  Psychiatric: He has a normal mood and affect. His behavior is normal. Judgment  and thought content normal.       Assessment & Plan:   During the course of the visit the patient was educated and counseled about appropriate screening and preventive services including:    Pneumococcal vaccine   Influenza vaccine  Td vaccine  Prostate cancer screening  Colorectal cancer screening  Nutrition counseling   Diet review for nutrition referral? Yes ____  Not Indicated _X___   Patient Instructions (the written plan) was given to the patient.  Medicare Attestation I have personally reviewed: The patient's medical and social history Their use of alcohol, tobacco or illicit drugs Their current medications and supplements The patient's functional ability including ADLs,fall risks, home safety risks, cognitive, and hearing and visual impairment Diet and physical activities Evidence for depression or mood disorders  The patient's weight, height, BMI,  have been recorded in the chart.  I have made referrals, counseling, and provided education to the patient based on review of the above and I have provided the patient with a written personalized care plan for preventive services.     Problem List Items Addressed This Visit      Other   Routine general medical examination at a health care facility    1) Anticipatory Guidance: Discussed importance of wearing a seatbelt while driving and not texting while driving; changing batteries in smoke detector at least once annually; wearing suntan lotion when outside; eating a balanced and moderate diet; getting physical activity at least 30 minutes per day.  2) Immunizations / Screenings / Labs:  Declines influenza and Zostavax. All other immunizations are up to date per recommendations. Obtain PSA for prostate cancer screening. Obtain methylmalonic acid for B12 screen. Obtian Hepatitis C antibody for Hepatitis C screening.  Obtain A1c for diabetes screening. Due for a dental exam encouraged to be completed independently.  Obtain CBC, CMET, and lipid profile.    Overall well exam with risk factors for cardiovascular disease including overweight/obesity and elevated cholesterol.Recommend weight loss of 5-10% of current body weight. Recommend increasing physical activity to 30 minutes of moderate level activity daily. Encourage nutritional intake that focuses on nutrient dense foods and is moderate, varied, and balanced and is low in saturated fats and processed/sugary foods.  Recheck cholesterol and recalculate ASCVD risk for need for cholesterol-lowering medication if applicable. Continue other healthy lifestyle behaviors and choices. Follow-up prevention exam in 1 year. Follow-up office visit pending blood work as necessary.  Relevant Orders   CBC (Completed)   Comprehensive metabolic panel (Completed)   Hemoglobin A1c (Completed)   Lipid panel (Completed)   Hepatitis C antibody   Methylmalonic acid, serum   Medicare annual wellness visit, subsequent - Primary    Reviewed and updated patient's medical, surgical, family and social history. Medications and allergies were also reviewed. Basic screenings for depression, activities of daily living, hearing, cognition and safety were performed. Provider list was updated and health plan was provided to the patient.       Obesity    BMI of 30. Recommend weight loss of 5-10% of current body weight. Recommend increasing physical activity to 30 minutes of moderate level activity daily. Encourage nutritional intake that focuses on nutrient dense foods and is moderate, varied, and balanced and is low in saturated fats and processed/sugary foods. Continue to monitor.        Other Visit Diagnoses    Screening for prostate cancer       Relevant Orders   PSA, Medicare (Completed)       I have discontinued Mr. Surace ipratropium and benzonatate. I am also having him maintain his acetaminophen and IBUPROFEN PO.   Follow-up: Return in about 1 year (around  04/21/2017), or if symptoms worsen or fail to improve.   Mauricio Po, FNP

## 2016-04-22 LAB — HEPATITIS C ANTIBODY: HCV AB: NEGATIVE

## 2016-04-23 ENCOUNTER — Encounter: Payer: Self-pay | Admitting: Family

## 2016-04-23 LAB — METHYLMALONIC ACID, SERUM: Methylmalonic Acid, Quant: 143 nmol/L (ref 87–318)

## 2016-06-13 DIAGNOSIS — H52223 Regular astigmatism, bilateral: Secondary | ICD-10-CM | POA: Diagnosis not present

## 2016-06-30 ENCOUNTER — Other Ambulatory Visit: Payer: Self-pay | Admitting: Family

## 2016-11-13 DIAGNOSIS — H2513 Age-related nuclear cataract, bilateral: Secondary | ICD-10-CM | POA: Diagnosis not present

## 2016-11-21 ENCOUNTER — Ambulatory Visit (INDEPENDENT_AMBULATORY_CARE_PROVIDER_SITE_OTHER): Payer: Medicare Other | Admitting: Physician Assistant

## 2016-11-21 VITALS — BP 126/72 | HR 75 | Temp 99.1°F | Resp 17 | Ht 65.0 in | Wt 172.0 lb

## 2016-11-21 DIAGNOSIS — B9789 Other viral agents as the cause of diseases classified elsewhere: Secondary | ICD-10-CM

## 2016-11-21 DIAGNOSIS — J988 Other specified respiratory disorders: Secondary | ICD-10-CM

## 2016-11-21 MED ORDER — GUAIFENESIN ER 1200 MG PO TB12: 1.0000 | ORAL_TABLET | Freq: Two times a day (BID) | ORAL | 1 refills | Status: DC | PRN

## 2016-11-21 MED ORDER — FLUTICASONE PROPIONATE 50 MCG/ACT NA SUSP: 2.0000 | Freq: Every day | NASAL | 12 refills | Status: DC

## 2016-11-21 NOTE — Patient Instructions (Addendum)
Please hydrate well with 64 oz of water if not more.   Call if you have no improvement of your symptoms.    Viral Respiratory Infection A viral respiratory infection is an illness that affects parts of the body used for breathing, like the lungs, nose, and throat. It is caused by a germ called a virus. Some examples of this kind of infection are:  A cold.  The flu (influenza).  A respiratory syncytial virus (RSV) infection. How do I know if I have this infection? Most of the time this infection causes:  A stuffy or runny nose.  Yellow or green fluid in the nose.  A cough.  Sneezing.  Tiredness (fatigue).  Achy muscles.  A sore throat.  Sweating or chills.  A fever.  A headache. How is this infection treated? If the flu is diagnosed early, it may be treated with an antiviral medicine. This medicine shortens the length of time a person has symptoms. Symptoms may be treated with over-the-counter and prescription medicines, such as:  Expectorants. These make it easier to cough up mucus.  Decongestant nasal sprays. Doctors do not prescribe antibiotic medicines for viral infections. They do not work with this kind of infection. How do I know if I should stay home? To keep others from getting sick, stay home if you have:  A fever.  A lasting cough.  A sore throat.  A runny nose.  Sneezing.  Muscles aches.  Headaches.  Tiredness.  Weakness.  Chills.  Sweating.  An upset stomach (nausea). Follow these instructions at home:  Rest as much as possible.  Take over-the-counter and prescription medicines only as told by your doctor.  Drink enough fluid to keep your pee (urine) clear or pale yellow.  Gargle with salt water. Do this 3-4 times per day or as needed. To make a salt-water mixture, dissolve -1 tsp of salt in 1 cup of warm water. Make sure the salt dissolves all the way.  Use nose drops made from salt water. This helps with stuffiness  (congestion). It also helps soften the skin around your nose.  Do not drink alcohol.  Do not use tobacco products, including cigarettes, chewing tobacco, and e-cigarettes. If you need help quitting, ask your doctor. Get help if:  Your symptoms last for 10 days or longer.  Your symptoms get worse over time.  You have a fever.  You have very bad pain in your face or forehead.  Parts of your jaw or neck become very swollen. Get help right away if:  You feel pain or pressure in your chest.  You have shortness of breath.  You faint or feel like you will faint.  You keep throwing up (vomiting).  You feel confused. This information is not intended to replace advice given to you by your health care provider. Make sure you discuss any questions you have with your health care provider. Document Released: 07/16/2008 Document Revised: 01/09/2016 Document Reviewed: 01/09/2015 Elsevier Interactive Patient Education  2017 [employer].   IF you received an x-ray today, you will receive an invoice from [facility]. Please contact [facility] at [phone] with questions or concerns regarding your invoice.   IF you received labwork today, you will receive an invoice from [facility]. Please contact [facility] at [phone] with questions or concerns regarding your invoice.   Our billing staff will not be able to assist you with questions regarding bills from these companies.  You will be contacted with the lab results as  soon as they are available. The fastest way to get your results is to activate your My Chart account. Instructions are located on the last page of this paperwork. If you have not heard from [place] regarding the results in 2 weeks, please contact this office.

## 2016-11-21 NOTE — Progress Notes (Signed)
PRIMARY CARE AT [place], [place] [address] [phone]  Date:  11/21/2016   Name:  Clifford Gould.   DOB:  07-18-1946   MRN:  [id number]  PCP:  Mauricio Po, FNP    History of Present Illness:  Clifford Gould. is a 71 y.o. male patient who presents to PCP with  Chief Complaint  Patient presents with  . Sore Throat  . Cough  . URI     Cough started 3 days ago, that progressively worsened in chest and phlegm.  Phlegm is yellow to green through the day.  No fever or chills.  No sob or dyspnea.  Throat feels dry.   He gets a pins and needles which resolved.  He is moving with his son.   No nasal congestion, and mild ear pain.  Sore throat tender.    He has used delsym.  Honey lemon drops.   Lived with smoker 40-50 years with second hand smoke exposure.   Hx of strep throat.     Patient Active Problem List   Diagnosis Date Noted  . Obesity 04/21/2016  . Routine general medical examination at a health care facility 04/18/2015  . Medicare annual wellness visit, subsequent 04/18/2015  . Right shoulder pain 09/25/2014  . Herpes simplex 04/16/2014    Past Medical History:  Diagnosis Date  . Chicken pox   . Erectile dysfunction   . Heart murmur   . Herpes simplex   . Hyperlipidemia    last year 2013 elevated now normal    Past Surgical History:  Procedure Laterality Date  . COLONOSCOPY     15+yrs ago- normal per pt.  . SPLENECTOMY      Social History  Substance Use Topics  . Smoking status: Never Smoker  . Smokeless tobacco: Never Used  . Alcohol use 1.2 - 2.4 oz/week    2 - 4 Cans of beer per week     Comment: occ beer    Family History  Problem Relation Age of Onset  . Pancreatic cancer Mother     pancreatic  . Diabetes Father   . Colon cancer Neg Hx     No Known Allergies  Medication list has been reviewed and updated.  Current Outpatient Prescriptions on File Prior to Visit  Medication Sig Dispense Refill  .  acetaminophen (TYLENOL) 500 MG tablet Take 500 mg by mouth every 6 (six) hours as needed for pain.    . IBUPROFEN PO Take by mouth.     No current facility-administered medications on file prior to visit.     ROS ROS otherwise unremarkable unless listed above.  Physical Examination: BP 126/72   Pulse 75   Temp 99.1 F (37.3 C) (Oral)   Resp 17   Ht 5\' 5"  (1.651 m)   Wt 172 lb (78 kg)   SpO2 97%   BMI 28.62 kg/m  Ideal Body Weight: Weight in (lb) to have BMI = 25: 149.9  Physical Exam  Constitutional: He is oriented to person, place, and time. He appears well-developed and well-nourished. No distress.  HENT:  Head: Normocephalic and atraumatic.  Right Ear: Tympanic membrane, external ear and ear canal normal.  Left Ear: Tympanic membrane, external ear and ear canal normal.  Nose: Mucosal edema and rhinorrhea present. Right sinus exhibits no maxillary sinus tenderness and no frontal sinus tenderness. Left sinus exhibits no maxillary sinus tenderness and no frontal sinus tenderness.  Mouth/Throat: No uvula swelling. No oropharyngeal  exudate, posterior oropharyngeal edema or posterior oropharyngeal erythema.  Eyes: Conjunctivae, EOM and lids are normal. Pupils are equal, round, and reactive to light. Right eye exhibits normal extraocular motion. Left eye exhibits normal extraocular motion.  Neck: Trachea normal and full passive range of motion without pain. No edema and no erythema present.  Cardiovascular: Normal rate.   Pulmonary/Chest: Effort normal. No respiratory distress. He has no decreased breath sounds. He has no wheezes. He has no rhonchi.  Neurological: He is alert and oriented to person, place, and time.  Skin: Skin is warm and dry. He is not diaphoretic.  Psychiatric: He has a normal mood and affect. His behavior is normal.     Assessment and Plan: Clifford Gould. is a 71 y.o. male who is here today  If he is not having improvement after 4 days, issue  azithromycin in zpak form. At this time, we will treat supportively.   Advised hydration. Viral respiratory illness - Plan: Guaifenesin (MUCINEX MAXIMUM STRENGTH) 1200 MG TB12, fluticasone (FLONASE) 50 MCG/ACT nasal spray  Clifford Drape, PA-C [facility] 4/8/20188:39 PM

## 2016-11-24 ENCOUNTER — Encounter: Payer: Self-pay | Admitting: Nurse Practitioner

## 2016-11-24 ENCOUNTER — Ambulatory Visit (INDEPENDENT_AMBULATORY_CARE_PROVIDER_SITE_OTHER): Payer: Medicare Other | Admitting: Nurse Practitioner

## 2016-11-24 VITALS — BP 140/74 | HR 64 | Temp 97.9°F | Ht 65.0 in | Wt 170.0 lb

## 2016-11-24 DIAGNOSIS — J069 Acute upper respiratory infection, unspecified: Secondary | ICD-10-CM

## 2016-11-24 MED ORDER — OXYMETAZOLINE HCL 0.05 % NA SOLN: 1.0000 | Freq: Two times a day (BID) | NASAL | 0 refills | Status: DC

## 2016-11-24 MED ORDER — HYDROCODONE-HOMATROPINE 5-1.5 MG/5ML PO SYRP
5.0000 mL | ORAL_SOLUTION | Freq: Every evening | ORAL | 0 refills | Status: DC | PRN
Start: 2016-11-24 — End: 2017-04-23

## 2016-11-24 NOTE — Patient Instructions (Signed)
URI Instructions: Flonase and Afrin use: apply 1spray of afrin in each nare, wait 93mins, then apply 2sprays of flonase in each nare. Use both nasal spray consecutively x 3days, then flonase only for at least 14days.  Encourage adequate oral hydration.  Use over-the-counter  "cold" medicines  such as "Tylenol cold" , "Advil cold",  "Mucinex" or" Mucinex D"  for cough and congestion.  Avoid decongestants if you have high blood pressure. Use" Delsym" or" Robitussin" cough syrup varietis for cough.  You can use plain "Tylenol" or "Advi"l for fever, chills and achyness.   "Common cold" symptoms are usually triggered by a virus.  The antibiotics are usually not necessary. On average, a" viral cold" illness would take 4-7 days to resolve. Please, make an appointment if you are not better or if you're worse.   Upper Respiratory Infection, Adult Most upper respiratory infections (URIs) are caused by a virus. A URI affects the nose, throat, and upper air passages. The most common type of URI is often called "the common cold." Follow these instructions at home:  Take medicines only as told by your doctor.  Gargle warm saltwater or take cough drops to comfort your throat as told by your doctor.  Use a warm mist humidifier or inhale steam from a shower to increase air moisture. This may make it easier to breathe.  Drink enough fluid to keep your pee (urine) clear or pale yellow.  Eat soups and other clear broths.  Have a healthy diet.  Rest as needed.  Go back to work when your fever is gone or your doctor says it is okay.  You may need to stay home longer to avoid giving your URI to others.  You can also wear a face mask and wash your hands often to prevent spread of the virus.  Use your inhaler more if you have asthma.  Do not use any tobacco products, including cigarettes, chewing tobacco, or electronic cigarettes. If you need help quitting, ask your doctor. Contact a doctor if:  You  are getting worse, not better.  Your symptoms are not helped by medicine.  You have chills.  You are getting more short of breath.  You have brown or red mucus.  You have yellow or brown discharge from your nose.  You have pain in your face, especially when you bend forward.  You have a fever.  You have puffy (swollen) neck glands.  You have pain while swallowing.  You have white areas in the back of your throat. Get help right away if:  You have very bad or constant:  Headache.  Ear pain.  Pain in your forehead, behind your eyes, and over your cheekbones (sinus pain).  Chest pain.  You have long-lasting (chronic) lung disease and any of the following:  Wheezing.  Long-lasting cough.  Coughing up blood.  A change in your usual mucus.  You have a stiff neck.  You have changes in your:  Vision.  Hearing.  Thinking.  Mood. This information is not intended to replace advice given to you by your health care provider. Make sure you discuss any questions you have with your health care provider. Document Released: 01/20/2008 Document Revised: 04/05/2016 Document Reviewed: 11/08/2013 Elsevier Interactive Patient Education  2017 [employer].

## 2016-11-24 NOTE — Progress Notes (Signed)
Pre visit review using our clinic review tool, if applicable. No additional management support is needed unless otherwise documented below in the visit note. 

## 2016-11-24 NOTE — Progress Notes (Signed)
Subjective:  Patient ID: Clifford Clos., male    DOB: 1946/01/09  Age: 71 y.o. MRN: [id number]  CC: Cough (coughing,bodyache,congestion,sore throat for 1 wk/went urgent care--took mucinex--not better)   Cough  This is a new problem. The current episode started in the past 7 days. The problem has been gradually improving. The problem occurs constantly. The cough is productive of sputum. Associated symptoms include nasal congestion, postnasal drip, rhinorrhea and a sore throat. Pertinent negatives include no chest pain, chills, ear congestion, ear pain, fever, headaches, heartburn, hemoptysis, myalgias, shortness of breath, sweats, weight loss or wheezing. The symptoms are aggravated by cold air. He has tried OTC cough suppressant for the symptoms. The treatment provided moderate relief.   Outpatient Medications Prior to Visit  Medication Sig Dispense Refill  . acetaminophen (TYLENOL) 500 MG tablet Take 500 mg by mouth every 6 (six) hours as needed for pain.    Marland Kitchen dextromethorphan (DELSYM) 30 MG/5ML liquid Take by mouth as needed for cough.    . Guaifenesin (MUCINEX MAXIMUM STRENGTH) 1200 MG TB12 Take 1 tablet (1,200 mg total) by mouth every 12 (twelve) hours as needed. 14 tablet 1  . IBUPROFEN PO Take by mouth.    . fluticasone (FLONASE) 50 MCG/ACT nasal spray Place 2 sprays into both nostrils daily. (Patient not taking: Reported on 11/24/2016) 16 g 12   No facility-administered medications prior to visit.     ROS See HPI  Objective:  BP 140/74   Pulse 64   Temp 97.9 F (36.6 C)   Ht 5\' 5"  (1.651 m)   Wt 170 lb (77.1 kg)   SpO2 97%   BMI 28.29 kg/m   BP Readings from Last 3 Encounters:  11/24/16 140/74  11/21/16 126/72  04/21/16 124/76    Wt Readings from Last 3 Encounters:  11/24/16 170 lb (77.1 kg)  11/21/16 172 lb (78 kg)  04/21/16 179 lb (81.2 kg)    Physical Exam  Constitutional: He is oriented to person, place, and time. No distress.  HENT:  Right Ear:  Tympanic membrane, external ear and ear canal normal.  Left Ear: Tympanic membrane and ear canal normal.  Nose: Mucosal edema and rhinorrhea present. Right sinus exhibits no maxillary sinus tenderness and no frontal sinus tenderness. Left sinus exhibits no maxillary sinus tenderness and no frontal sinus tenderness.  Mouth/Throat: Uvula is midline. Posterior oropharyngeal erythema present. No oropharyngeal exudate.  Eyes: No scleral icterus.  Neck: Normal range of motion. Neck supple.  Cardiovascular: Normal rate and regular rhythm.   Pulmonary/Chest: Effort normal and breath sounds normal.  Lymphadenopathy:    He has no cervical adenopathy.  Neurological: He is alert and oriented to person, place, and time.  Vitals reviewed.   Lab Results  Component Value Date   WBC 8.4 04/21/2016   HGB 14.5 04/21/2016   HCT 41.9 04/21/2016   PLT 385.0 04/21/2016   GLUCOSE 112 (H) 04/21/2016   CHOL 201 (H) 04/21/2016   TRIG 312.0 (H) 04/21/2016   HDL 35.70 (L) 04/21/2016   LDLDIRECT 99.0 04/21/2016   LDLCALC 124 (H) 01/23/2013   ALT 18 04/21/2016   AST 21 04/21/2016   NA 139 04/21/2016   K 4.9 04/21/2016   CL 107 04/21/2016   CREATININE 1.29 04/21/2016   BUN 24 (H) 04/21/2016   CO2 26 04/21/2016   TSH 2.02 04/18/2015   PSA 2.70 04/21/2016   INR 0.89 09/25/2009   HGBA1C 6.1 04/21/2016    Dg Shoulder Right  Result  Date: 09/25/2014 CLINICAL DATA:  Chronic pain.  No known injury.  Initial evaluation. EXAM: RIGHT SHOULDER - 2+ VIEW COMPARISON:  None. FINDINGS: Acromioclavicular degenerative change. No evidence of fracture or dislocation. No evidence of separation. Tiny sclerotic density noted in the right humerus consistent with bone island. IMPRESSION: No acute abnormality.  Acromioclavicular degenerative change. Electronically Signed   By: Marcello Moores  Register   On: 09/25/2014 10:29    Assessment & Plan:   Marico was seen today for cough.  Diagnoses and all orders for this visit:  Acute  URI -     HYDROcodone-homatropine (HYCODAN) 5-1.5 MG/5ML syrup; Take 5 mLs by mouth at bedtime as needed for cough. -     oxymetazoline (AFRIN NASAL SPRAY) 0.05 % nasal spray; Place 1 spray into both nostrils 2 (two) times daily. Use only for 3days, then stop   I am having Mr. Cretella start on HYDROcodone-homatropine and oxymetazoline. I am also having him maintain his acetaminophen, IBUPROFEN PO, dextromethorphan, Guaifenesin, and fluticasone.  Meds ordered this encounter  Medications  . HYDROcodone-homatropine (HYCODAN) 5-1.5 MG/5ML syrup    Sig: Take 5 mLs by mouth at bedtime as needed for cough.    Dispense:  120 mL    Refill:  0    Order Specific Question:   Supervising Provider    Answer:   Cassandria Anger [[id number]]  . oxymetazoline (AFRIN NASAL SPRAY) 0.05 % nasal spray    Sig: Place 1 spray into both nostrils 2 (two) times daily. Use only for 3days, then stop    Dispense:  30 mL    Refill:  0    Order Specific Question:   Supervising Provider    Answer:   Cassandria Anger [[id number]]    Follow-up: Return if symptoms worsen or fail to improve.  Wilfred Lacy, NP

## 2017-04-23 ENCOUNTER — Encounter: Payer: Self-pay | Admitting: Family

## 2017-04-23 ENCOUNTER — Ambulatory Visit (INDEPENDENT_AMBULATORY_CARE_PROVIDER_SITE_OTHER): Payer: Medicare Other | Admitting: Family

## 2017-04-23 ENCOUNTER — Ambulatory Visit (INDEPENDENT_AMBULATORY_CARE_PROVIDER_SITE_OTHER)
Admission: RE | Admit: 2017-04-23 | Discharge: 2017-04-23 | Disposition: A | Discharge status: Home / Self Care | Payer: Medicare Other | Source: Ambulatory Visit | Attending: Family | Admitting: Family

## 2017-04-23 ENCOUNTER — Other Ambulatory Visit (INDEPENDENT_AMBULATORY_CARE_PROVIDER_SITE_OTHER): Payer: Medicare Other

## 2017-04-23 VITALS — BP 132/70 | HR 51 | Temp 97.9°F | Resp 16 | Ht 65.0 in | Wt 169.1 lb

## 2017-04-23 DIAGNOSIS — Z0000 Encounter for general adult medical examination without abnormal findings: Secondary | ICD-10-CM | POA: Diagnosis not present

## 2017-04-23 DIAGNOSIS — M25621 Stiffness of right elbow, not elsewhere classified: Secondary | ICD-10-CM

## 2017-04-23 DIAGNOSIS — M25521 Pain in right elbow: Secondary | ICD-10-CM | POA: Insufficient documentation

## 2017-04-23 DIAGNOSIS — M79671 Pain in right foot: Secondary | ICD-10-CM | POA: Diagnosis not present

## 2017-04-23 DIAGNOSIS — Z125 Encounter for screening for malignant neoplasm of prostate: Secondary | ICD-10-CM

## 2017-04-23 DIAGNOSIS — Z0001 Encounter for general adult medical examination with abnormal findings: Secondary | ICD-10-CM

## 2017-04-23 LAB — COMPREHENSIVE METABOLIC PANEL
ALBUMIN: 4.1 g/dL (ref 3.5–5.2)
ALT: 33 U/L (ref 0–53)
AST: 32 U/L (ref 0–37)
Alkaline Phosphatase: 61 U/L (ref 39–117)
BUN: 29 mg/dL — ABNORMAL HIGH (ref 6–23)
CALCIUM: 9.3 mg/dL (ref 8.4–10.5)
CHLORIDE: 108 meq/L (ref 96–112)
CO2: 27 meq/L (ref 19–32)
Creatinine, Ser: 1.25 mg/dL (ref 0.40–1.50)
GFR: 60.64 mL/min (ref >60.00)
Glucose, Bld: 106 mg/dL — ABNORMAL HIGH (ref 70–99)
POTASSIUM: 4.6 meq/L (ref 3.5–5.1)
Sodium: 140 mEq/L (ref 135–145)
Total Bilirubin: 0.5 mg/dL (ref 0.2–1.2)
Total Protein: 6.5 g/dL (ref 6.0–8.3)

## 2017-04-23 LAB — LIPID PANEL
CHOL/HDL RATIO: 4
CHOLESTEROL: 148 mg/dL (ref 0–200)
HDL: 39.1 mg/dL (ref >39.00)
LDL CALC: 90 mg/dL (ref 0–99)
NonHDL: 109.17
TRIGLYCERIDES: 96 mg/dL (ref 0.0–149.0)
VLDL: 19.2 mg/dL (ref 0.0–40.0)

## 2017-04-23 LAB — CBC
HEMATOCRIT: 40.7 % (ref 39.0–52.0)
HEMOGLOBIN: 13.3 g/dL (ref 13.0–17.0)
MCHC: 32.8 g/dL (ref 30.0–36.0)
MCV: 99 fl (ref 78.0–100.0)
PLATELETS: 411 10*3/uL — AB (ref 150.0–400.0)
RBC: 4.11 Mil/uL — AB (ref 4.22–5.81)
RDW: 13.1 % (ref 11.5–15.5)
WBC: 7.5 10*3/uL (ref 4.0–10.5)

## 2017-04-23 LAB — PSA: PSA: 3.51 ng/mL (ref 0.10–4.00)

## 2017-04-23 MED ORDER — ZOSTER VAC RECOMB ADJUVANTED 50 MCG/0.5ML IM SUSR: 0.5000 mL | Freq: Once | INTRAMUSCULAR | 1 refills | Status: AC

## 2017-04-23 MED ORDER — VALACYCLOVIR HCL 1 G PO TABS: ORAL_TABLET | ORAL | 0 refills | Status: DC

## 2017-04-23 MED ORDER — CLOTRIMAZOLE-BETAMETHASONE 1-0.05 % EX CREA: 1.0000 "application " | TOPICAL_CREAM | Freq: Two times a day (BID) | CUTANEOUS | 1 refills | Status: DC

## 2017-04-23 MED ORDER — SILDENAFIL CITRATE 20 MG PO TABS: ORAL_TABLET | ORAL | 0 refills | Status: DC

## 2017-04-23 NOTE — Assessment & Plan Note (Signed)
Reviewed and updated patient's medical, surgical, family and social history. Medications and allergies were also reviewed. Basic screenings for depression, activities of daily living, hearing, cognition and safety were performed. Provider list was updated and health plan was provided to the patient.  

## 2017-04-23 NOTE — Assessment & Plan Note (Signed)
Right elbow pain with no significant trauma or injury and decreased range of motion. Obtain x-rays. Treat conservatively with ice/home exercise therapy and over-the-counter medications as needed for symptom relief and supportive care. Follow-up pending x-ray results or if symptoms worsen or do not improve.

## 2017-04-23 NOTE — Assessment & Plan Note (Signed)
Right foot pain of the second and third metatarsals following traumatic event with no crepitus or deformity. Most likely contusion. Encouraged to wear a supportive shoe. Continue with ice and over-the-counter medications as needed for symptom relief and supportive care. Follow-up if symptoms worsen or do not improve.

## 2017-04-23 NOTE — Assessment & Plan Note (Signed)
1) Anticipatory Guidance: Discussed importance of wearing a seatbelt while driving and not texting while driving; changing batteries in smoke detector at least once annually; wearing suntan lotion when outside; eating a balanced and moderate diet; getting physical activity at least 30 minutes per day.  2) Immunizations / Screenings / Labs:  Declines influenza indicating he will obtain it at work. All other immunizations are up-to-date per recommendations. Obtain PSA for prostate cancer screening. Hepatitis C and colon cancer screening up-to-date per recommendations. Obtain chest x-ray secondary to chronic secondhand smoke exposure. Obtain CBC, CMET, and lipid profile.    Overall well exam with risk factors for cardiovascular disease including overweight. Recommend continued weight loss of 5-10% of current body weight through nutrition and physical activity. He has lost approximately 10 pounds since previous office visit. Chronic conditions appear adequately controlled. Continue other healthy lifestyle behaviors and choices. Follow-up prevention exam in 1 year. Follow-up office visit pending blood work and for chronic conditions as needed.

## 2017-04-23 NOTE — Assessment & Plan Note (Deleted)
Right elbow pain with no significant trauma or injury and decreased range of motion. Obtain x-rays. Treat conservatively with ice/home exercise therapy and over-the-counter medications as needed for symptom relief and supportive care. Follow-up pending x-ray results or if symptoms worsen or do not improve.

## 2017-04-23 NOTE — Progress Notes (Signed)
Subjective:    Patient ID: Clifford Clos., male    DOB: 1945/09/28, 71 y.o.   MRN: [id number]  Chief Complaint  Patient presents with  . CPE    Fasting    HPI:  Clifford Gehres. is a 71 y.o. male who presents today for a Medicare Annual Wellness/Physical exam.    1) Health Maintenance -   Diet - Averages about 2-3 meals per day consisting of a regular diet; Caffeine intake of about 1-2 cups per day on occasion.   Exercise - Tennis and cardio  Wt Readings from Last 3 Encounters:  04/23/17 169 lb 1.9 oz (76.7 kg)  11/24/16 170 lb (77.1 kg)  11/21/16 172 lb (78 kg)     2) Preventative Exams / Immunizations:  Dental -- Up to date  Vision -- Up to date   Health Maintenance  Topic Date Due  . INFLUENZA VACCINE  03/17/2017  . TETANUS/TDAP  12/23/2021  . COLONOSCOPY  04/26/2023  . Hepatitis C Screening  Completed     Immunization History  Administered Date(s) Administered  . Pneumococcal Conjugate-13 04/16/2014  . Pneumococcal Polysaccharide-23 12/24/2011  . Tetanus 12/24/2011    RISK FACTORS  Tobacco History  Smoking Status  . Never Smoker  Smokeless Tobacco  . Never Used     Cardiac risk factors: advanced age (older than 56 for men, 80 for women), male gender and smoking/ tobacco exposure.  Depression Screen  Depression screen [facility] 2/9 04/23/2017  Decreased Interest 0  Down, Depressed, Hopeless 0  PHQ - 2 Score 0  Altered sleeping 0  Tired, decreased energy 0  Change in appetite 0  Feeling bad or failure about yourself  1  Trouble concentrating 1  Moving slowly or fidgety/restless 0  Suicidal thoughts 0  PHQ-9 Score 2     Activities of Daily Living In your present state of health, do you have any difficulty performing the following activities?:  Driving? No Managing money?  No Feeding yourself? No Getting from bed to chair? No Climbing a flight of stairs? No Preparing food and eating?: No Bathing or showering? No Getting  dressed: No Getting to the toilet? No Using the toilet: No Moving around from place to place: No In the past year have you fallen or had a near fall?:No   Home Safety Has smoke detector and wears seat belts. No firearms. No excess sun exposure. Are there smokers in your home (other than you)?  Yes Do you feel safe at home?  Yes  Hearing Difficulties: No Do you often ask people to speak up or repeat themselves? No Do you experience ringing or noises in your ears? No  Do you have difficulty understanding soft or whispered voices? No    Cognitive Testing  Alert? Yes   Normal Appearance? Yes  Oriented to person? Yes  Place? Yes   Time? Yes  Recall of three objects?  Yes  Can perform simple calculations? Yes  Displays appropriate judgment? Yes  Can read the correct time from a watch face? Yes  Do you feel that you have a problem with memory? No  Do you often misplace items? No   Advanced Directives have been discussed with the patient? Yes   Current Physicians/Providers and Suppliers  1. Terri Piedra, FNP - Internal Medicine    Indicate any recent Medical Services you may have received from other than [facility] providers in the past year (date may be approximate).  All answers were reviewed with  the patient and necessary referrals were made:  Mauricio Po, [place]   04/23/2017   3.) Elbow range of motion decreased - this is a new problem. Associated symptoms of pain and decreased range of motion have been going on for about 1 month. Denies any trauma or injury. Flexion is decreased. No weakness or numbness or tingling.  4.) Foot pain - this is a new problem. Associated symptom of pain located in his right foot is going on for approximately 3 weeks following something was dropped on his foot. Denies any sounds/sensations heard or felt. Out of finding factors include ice. Continues to experience discomfort with flexion and extension. No previous history of injury.   No Known  Allergies   Outpatient Medications Prior to Visit  Medication Sig Dispense Refill  . IBUPROFEN PO Take by mouth.    . Guaifenesin (MUCINEX MAXIMUM STRENGTH) 1200 MG TB12 Take 1 tablet (1,200 mg total) by mouth every 12 (twelve) hours as needed. 14 tablet 1  . acetaminophen (TYLENOL) 500 MG tablet Take 500 mg by mouth every 6 (six) hours as needed for pain.    Marland Kitchen dextromethorphan (DELSYM) 30 MG/5ML liquid Take by mouth as needed for cough.    . fluticasone (FLONASE) 50 MCG/ACT nasal spray Place 2 sprays into both nostrils daily. (Patient not taking: Reported on 11/24/2016) 16 g 12  . HYDROcodone-homatropine (HYCODAN) 5-1.5 MG/5ML syrup Take 5 mLs by mouth at bedtime as needed for cough. 120 mL 0  . oxymetazoline (AFRIN NASAL SPRAY) 0.05 % nasal spray Place 1 spray into both nostrils 2 (two) times daily. Use only for 3days, then stop 30 mL 0   No facility-administered medications prior to visit.      Past Medical History:  Diagnosis Date  . Chicken pox   . Erectile dysfunction   . Heart murmur   . Herpes simplex   . Hyperlipidemia    last year 2013 elevated now normal     Past Surgical History:  Procedure Laterality Date  . COLONOSCOPY     15+yrs ago- normal per pt.  . SPLENECTOMY       Family History  Problem Relation Age of Onset  . Pancreatic cancer Mother        pancreatic  . Diabetes Father   . Colon cancer Neg Hx      Social History   Social History  . Marital status: Married    Spouse name: N/A  . Number of children: 3  . Years of education: 75   Occupational History  . Nurse Aide     Social History Main Topics  . Smoking status: Never Smoker  . Smokeless tobacco: Never Used  . Alcohol use 1.2 - 2.4 oz/week    2 - 4 Cans of beer per week     Comment: occ beer  . Drug use: No  . Sexual activity: No   Other Topics Concern  . Not on file   Social History Narrative   Born and raised in [place], [place]. Currently resides in a house with his wife. 2 dogs  (1 boxer, half-lab/pitbull). Fun: Play golf, tennis and read.    Denies religious beliefs that would effect health care.      Review of Systems  Constitutional: Denies fever, chills, fatigue, or significant weight gain/loss. HENT: Head: Denies headache or neck pain Ears: Denies changes in hearing, ringing in ears, earache, drainage Nose: Denies discharge, stuffiness, itching, nosebleed, sinus pain Throat: Denies sore throat, hoarseness, dry mouth, sores,  thrush Eyes: Denies loss/changes in vision, pain, redness, blurry/double vision, flashing lights Cardiovascular: Denies chest pain/discomfort, tightness, palpitations, shortness of breath with activity, difficulty lying down, swelling, sudden awakening with shortness of breath Respiratory: Denies shortness of breath, cough, sputum production, wheezing Gastrointestinal: Denies dysphasia, heartburn, change in appetite, nausea, change in bowel habits, rectal bleeding, constipation, diarrhea, yellow skin or eyes Genitourinary: Denies frequency, urgency, burning/pain, blood in urine, incontinence, change in urinary strength. Musculoskeletal: Denies muscle/joint pain, stiffness, back pain, redness or swelling of joints, trauma Skin: Denies rashes, lumps, itching, dryness, color changes, or hair/nail changes Neurological: Denies dizziness, fainting, seizures, weakness, numbness, tingling, tremor Psychiatric - Denies nervousness, stress, depression or memory loss Endocrine: Denies heat or cold intolerance, sweating, frequent urination, excessive thirst, changes in appetite Hematologic: Denies ease of bruising or bleeding    Objective:     BP 132/70 (BP Location: Left Arm, Patient Position: Sitting, Cuff Size: Normal)   Pulse (!) 51   Temp 97.9 F (36.6 C) (Oral)   Resp 16   Ht 5\' 5"  (1.651 m)   Wt 169 lb 1.9 oz (76.7 kg)   SpO2 96%   BMI 28.14 kg/m  Nursing note and vital signs reviewed.  Physical Exam  Constitutional: He is oriented  to person, place, and time. He appears well-developed and well-nourished.  HENT:  Head: Normocephalic.  Right Ear: Hearing, tympanic membrane, external ear and ear canal normal.  Left Ear: Hearing, tympanic membrane, external ear and ear canal normal.  Nose: Nose normal.  Mouth/Throat: Uvula is midline, oropharynx is clear and moist and mucous membranes are normal.  Eyes: Pupils are equal, round, and reactive to light. Conjunctivae and EOM are normal.  Neck: Neck supple. No JVD present. No tracheal deviation present. No thyromegaly present.  Cardiovascular: Normal rate, regular rhythm, normal heart sounds and intact distal pulses.   Pulmonary/Chest: Effort normal and breath sounds normal.  Abdominal: Soft. Bowel sounds are normal. He exhibits no distension and no mass. There is no tenderness. There is no rebound and no guarding.  Musculoskeletal: Normal range of motion. He exhibits no edema or tenderness.  Right elbow - no obvious deformity, discoloration, or edema. No tenderness able to be elicited. There is decreased extension and flexion. Distal pulses and sensation are intact and appropriate. Muscle strength is normal.  Right foot - no obvious deformity, discoloration, or edema. Mild tenderness over the second and third metatarsals with no crepitus or deformity. Negative tuning fork. Capillary refill pulses are intact and appropriate.  Lymphadenopathy:    He has no cervical adenopathy.  Neurological: He is alert and oriented to person, place, and time. He has normal reflexes. No cranial nerve deficit. He exhibits normal muscle tone. Coordination normal.  Skin: Skin is warm and dry.  Psychiatric: He has a normal mood and affect. His behavior is normal. Judgment and thought content normal.       Assessment & Plan:   During the course of the visit the patient was educated and counseled about appropriate screening and preventive services including:    Pneumococcal vaccine   Influenza  vaccine  Prostate cancer screening  Colorectal cancer screening  Nutrition counseling   Diet review for nutrition referral? Yes ____  Not Indicated _X___   Patient Instructions (the written plan) was given to the patient.  Medicare Attestation I have personally reviewed: The patient's medical and social history Their use of alcohol, tobacco or illicit drugs Their current medications and supplements The patient's functional ability including ADLs,fall risks,  home safety risks, cognitive, and hearing and visual impairment Diet and physical activities Evidence for depression or mood disorders  The patient's weight, height, BMI,  have been recorded in the chart.  I have made referrals, counseling, and provided education to the patient based on review of the above and I have provided the patient with a written personalized care plan for preventive services.     Problem List Items Addressed This Visit      Other   Routine general medical examination at a health care facility    1) Anticipatory Guidance: Discussed importance of wearing a seatbelt while driving and not texting while driving; changing batteries in smoke detector at least once annually; wearing suntan lotion when outside; eating a balanced and moderate diet; getting physical activity at least 30 minutes per day.  2) Immunizations / Screenings / Labs:  Declines influenza indicating he will obtain it at work. All other immunizations are up-to-date per recommendations. Obtain PSA for prostate cancer screening. Hepatitis C and colon cancer screening up-to-date per recommendations. Obtain chest x-ray secondary to chronic secondhand smoke exposure. Obtain CBC, CMET, and lipid profile.    Overall well exam with risk factors for cardiovascular disease including overweight. Recommend continued weight loss of 5-10% of current body weight through nutrition and physical activity. He has lost approximately 10 pounds since previous office  visit. Chronic conditions appear adequately controlled. Continue other healthy lifestyle behaviors and choices. Follow-up prevention exam in 1 year. Follow-up office visit pending blood work and for chronic conditions as needed.       Relevant Orders   DG Chest 2 View (Completed)   CBC (Completed)   Comprehensive metabolic panel (Completed)   Lipid panel (Completed)   PSA (Completed)   Medicare annual wellness visit, subsequent - Primary    Reviewed and updated patient's medical, surgical, family and social history. Medications and allergies were also reviewed. Basic screenings for depression, activities of daily living, hearing, cognition and safety were performed. Provider list was updated and health plan was provided to the patient.        Right foot pain    Right foot pain of the second and third metatarsals following traumatic event with no crepitus or deformity. Most likely contusion. Encouraged to wear a supportive shoe. Continue with ice and over-the-counter medications as needed for symptom relief and supportive care. Follow-up if symptoms worsen or do not improve.      Decreased range of motion of right elbow    Right elbow pain with no significant trauma or injury and decreased range of motion. Obtain x-rays. Treat conservatively with ice/home exercise therapy and over-the-counter medications as needed for symptom relief and supportive care. Follow-up pending x-ray results or if symptoms worsen or do not improve.       Relevant Orders   DG Elbow Complete Right (Completed)       I have discontinued Clifford Gould acetaminophen, dextromethorphan, Guaifenesin, fluticasone, HYDROcodone-homatropine, and oxymetazoline. I am also having him start on Zoster Vac Recomb Adjuvanted, valACYclovir, clotrimazole-betamethasone, and sildenafil. Additionally, I am having him maintain his IBUPROFEN PO and diphenhydramine-acetaminophen.   Meds ordered this encounter  Medications  .  diphenhydramine-acetaminophen (TYLENOL PM EXTRA STRENGTH) 25-500 MG TABS tablet    Sig: Take 1 tablet by mouth at bedtime as needed.  . Zoster Vac Recomb Adjuvanted [facility]) injection    Sig: Inject 0.5 mLs into the muscle once. Repeat in 2-6 months after first injection.    Dispense:  0.5 mL  Refill:  1    Order Specific Question:   Supervising Provider    Answer:   Pricilla Holm A [[id number]]  . valACYclovir (VALTREX) 1000 MG tablet    Sig: Take 2 tablets by mouth twice daily for 1 day as needed for cold sores    Dispense:  8 tablet    Refill:  0    Order Specific Question:   Supervising Provider    Answer:   Pricilla Holm A [[id number]]  . clotrimazole-betamethasone (LOTRISONE) cream    Sig: Apply 1 application topically 2 (two) times daily.    Dispense:  30 g    Refill:  1    Order Specific Question:   Supervising Provider    Answer:   Pricilla Holm A [[id number]]  . sildenafil (REVATIO) 20 MG tablet    Sig: Take 1-5 tablets by mouth daily as needed.    Dispense:  50 tablet    Refill:  0    Order Specific Question:   Supervising Provider    Answer:   Pricilla Holm A [[id number]]     Follow-up: Return in about 6 months (around 10/21/2017), or if symptoms worsen or fail to improve.   Mauricio Po, FNP

## 2017-04-23 NOTE — Patient Instructions (Addendum)
Thank you for choosing [facility].  SUMMARY AND INSTRUCTIONS:  Ice x 20 minutes every other hour and as needed after activity.  Stretches and activities daily.  Continue to work on nutrition and physical activity.  Medication:  Your prescription(s) have been submitted to your pharmacy or been printed and provided for you. Please take as directed and contact our office if you believe you are having problem(s) with the medication(s) or have any questions.  Labs:  Please stop by the lab on the lower level of the building for your blood work. Your results will be released to [facility] (or called to you) after review, usually within 72 hours after test completion. If any changes need to be made, you will be notified at that same time.  1.) The lab is open from 7:30am to 5:30 pm Monday-Friday 2.) No appointment is necessary 3.) Fasting (if needed) is 6-8 hours after food and drink; black coffee and water are okay   Imaging / Radiology:  Please stop by radiology on the basement level of the building for your x-rays. Your results will be released to [place] (or called to you) after review, usually within 72 hours after test completion. If any treatments or changes are necessary, you will be notified at that same time.  Follow up:  If your symptoms worsen or fail to improve, please contact our office for further instruction, or in case of emergency go directly to the emergency room at the closest medical facility.    Health Maintenance  Topic Date Due  . INFLUENZA VACCINE  03/17/2017  . TETANUS/TDAP  12/23/2021  . COLONOSCOPY  04/26/2023  . Hepatitis C Screening  Completed     Health Maintenance, Male A healthy lifestyle and preventive care is important for your health and wellness. Ask your health care provider about what schedule of regular examinations is right for you. What should I know about weight and diet? Eat a Healthy Diet  Eat plenty of vegetables, fruits, whole  grains, low-fat dairy products, and lean protein.  Do not eat a lot of foods high in solid fats, added sugars, or salt.  Maintain a Healthy Weight Regular exercise can help you achieve or maintain a healthy weight. You should:  Do at least 150 minutes of exercise each week. The exercise should increase your heart rate and make you sweat (moderate-intensity exercise).  Do strength-training exercises at least twice a week.  Watch Your Levels of Cholesterol and Blood Lipids  Have your blood tested for lipids and cholesterol every 5 years starting at 70 years of age. If you are at high risk for heart disease, you should start having your blood tested when you are 70 years old. You may need to have your cholesterol levels checked more often if: ? Your lipid or cholesterol levels are high. ? You are older than 70 years of age. ? You are at high risk for heart disease.  What should I know about cancer screening? Many types of cancers can be detected early and may often be prevented. Lung Cancer  You should be screened every year for lung cancer if: ? You are a current smoker who has smoked for at least 30 years. ? You are a former smoker who has quit within the past 15 years.  Talk to your health care provider about your screening options, when you should start screening, and how often you should be screened.  Colorectal Cancer  Routine colorectal cancer screening usually begins at 70 years  of age and should be repeated every 5-10 years until you are 70 years old. You may need to be screened more often if early forms of precancerous polyps or small growths are found. Your health care provider may recommend screening at an earlier age if you have risk factors for colon cancer.  Your health care provider may recommend using home test kits to check for hidden blood in the stool.  A small camera at the end of a tube can be used to examine your colon (sigmoidoscopy or colonoscopy). This checks  for the earliest forms of colorectal cancer.  Prostate and Testicular Cancer  Depending on your age and overall health, your health care provider may do certain tests to screen for prostate and testicular cancer.  Talk to your health care provider about any symptoms or concerns you have about testicular or prostate cancer.  Skin Cancer  Check your skin from head to toe regularly.  Tell your health care provider about any new moles or changes in moles, especially if: ? There is a change in a mole's size, shape, or color. ? You have a mole that is larger than a pencil eraser.  Always use sunscreen. Apply sunscreen liberally and repeat throughout the day.  Protect yourself by wearing long sleeves, pants, a wide-brimmed hat, and sunglasses when outside.  What should I know about heart disease, diabetes, and high blood pressure?  If you are 76-80 years of age, have your blood pressure checked every 3-5 years. If you are 7 years of age or older, have your blood pressure checked every year. You should have your blood pressure measured twice-once when you are at a hospital or clinic, and once when you are not at a hospital or clinic. Record the average of the two measurements. To check your blood pressure when you are not at a hospital or clinic, you can use: ? An automated blood pressure machine at a pharmacy. ? A home blood pressure monitor.  Talk to your health care provider about your target blood pressure.  If you are between 4-21 years old, ask your health care provider if you should take aspirin to prevent heart disease.  Have regular diabetes screenings by checking your fasting blood sugar level. ? If you are at a normal weight and have a low risk for diabetes, have this test once every three years after the age of 6. ? If you are overweight and have a high risk for diabetes, consider being tested at a younger age or more often.  A one-time screening for abdominal aortic aneurysm  (AAA) by ultrasound is recommended for men aged 37-75 years who are current or former smokers. What should I know about preventing infection? Hepatitis B If you have a higher risk for hepatitis B, you should be screened for this virus. Talk with your health care provider to find out if you are at risk for hepatitis B infection. Hepatitis C Blood testing is recommended for:  Everyone born from [birth year] through 1964.  Anyone with known risk factors for hepatitis C.  Sexually Transmitted Diseases (STDs)  You should be screened each year for STDs including gonorrhea and chlamydia if: ? You are sexually active and are younger than 70 years of age. ? You are older than 70 years of age and your health care provider tells you that you are at risk for this type of infection. ? Your sexual activity has changed since you were last screened and you are at an increased  risk for chlamydia or gonorrhea. Ask your health care provider if you are at risk.  Talk with your health care provider about whether you are at high risk of being infected with HIV. Your health care provider may recommend a prescription medicine to help prevent HIV infection.  What else can I do?  Schedule regular health, dental, and eye exams.  Stay current with your vaccines (immunizations).  Do not use any tobacco products, such as cigarettes, chewing tobacco, and e-cigarettes. If you need help quitting, ask your health care provider.  Limit alcohol intake to no more than 2 drinks per day. One drink equals 12 ounces of beer, 5 ounces of wine, or 1 ounces of hard liquor.  Do not use street drugs.  Do not share needles.  Ask your health care provider for help if you need support or information about quitting drugs.  Tell your health care provider if you often feel depressed.  Tell your health care provider if you have ever been abused or do not feel safe at home. This information is not intended to replace advice given to you  by your health care provider. Make sure you discuss any questions you have with your health care provider. Document Released: 01/30/2008 Document Revised: 04/01/2016 Document Reviewed: 05/07/2015 Elsevier Interactive Patient Education  2018 [place] Directive Advance directives are legal documents that let you make choices ahead of time about your health care and medical treatment in case you become unable to communicate for yourself. Advance directives are a way for you to communicate your wishes to family, friends, and health care providers. This can help convey your decisions about end-of-life care if you become unable to communicate. Discussing and writing advance directives should happen over time rather than all at once. Advance directives can be changed depending on your situation and what you want, even after you have signed the advance directives. If you do not have an advance directive, some states assign family decision makers to act on your behalf based on how closely you are related to them. Each state has its own laws regarding advance directives. You may want to check with your health care provider, attorney, or state representative about the laws in your state. There are different types of advance directives, such as:  Medical power of attorney.  Living will.  Do not resuscitate (DNR) or do not attempt resuscitation (DNAR) order.  Health care proxy and medical power of attorney A health care proxy, also called a health care agent, is a person who is appointed to make medical decisions for you in cases in which you are unable to make the decisions yourself. Generally, people choose someone they know well and trust to represent their preferences. Make sure to ask this person for an agreement to act as your proxy. A proxy may have to exercise judgment in the event of a medical decision for which your wishes are not known. A medical power of attorney is a legal  document that names your health care proxy. Depending on the laws in your state, after the document is written, it may also need to be:  Signed.  Notarized.  Dated.  Copied.  Witnessed.  Incorporated into your medical record.  You may also want to appoint someone to manage your financial affairs in a situation in which you are unable to do so. This is called a durable power of attorney for finances. It is a separate legal document from the durable power  of attorney for health care. You may choose the same person or someone different from your health care proxy to act as your agent in financial matters. If you do not appoint a proxy, or if there is a concern that the proxy is not acting in your best interests, a court-appointed guardian may be designated to act on your behalf. Living will A living will is a set of instructions documenting your wishes about medical care when you cannot express them yourself. Health care providers should keep a copy of your living will in your medical record. You may want to give a copy to family members or friends. To alert caregivers in case of an emergency, you can place a card in your wallet to let them know that you have a living will and where they can find it. A living will is used if you become:  Terminally ill.  Incapacitated.  Unable to communicate or make decisions.  Items to consider in your living will include:  The use or non-use of life-sustaining equipment, such as dialysis machines and breathing machines (ventilators).  A DNR or DNAR order, which is the instruction not to use cardiopulmonary resuscitation (CPR) if breathing or heartbeat stops.  The use or non-use of tube feeding.  Withholding of food and fluids.  Comfort (palliative) care when the goal becomes comfort rather than a cure.  Organ and tissue donation.  A living will does not give instructions for distributing your money and property if you should pass away. It is  recommended that you seek the advice of a lawyer when writing a will. Decisions about taxes, beneficiaries, and asset distribution will be legally binding. This process can relieve your family and friends of any concerns surrounding disputes or questions that may come up about the distribution of your assets. DNR or DNAR A DNR or DNAR order is a request not to have CPR in the event that your heart stops beating or you stop breathing. If a DNR or DNAR order has not been made and shared, a health care provider will try to help any patient whose heart has stopped or who has stopped breathing. If you plan to have surgery, talk with your health care provider about how your DNR or DNAR order will be followed if problems occur. Summary  Advance directives are the legal documents that allow you to make choices ahead of time about your health care and medical treatment in case you become unable to communicate for yourself.  The process of discussing and writing advance directives should happen over time. You can change the advance directives, even after you have signed them.  Advance directives include DNR or DNAR orders, living wills, and designating an agent as your medical power of attorney. This information is not intended to replace advice given to you by your health care provider. Make sure you discuss any questions you have with your health care provider. Document Released: 11/10/2007 Document Revised: 06/22/2016 Document Reviewed: 06/22/2016 Elsevier Interactive Patient Education  2017 [place].   Elbow and Forearm Exercises Ask your health care provider which exercises are safe for you. Do exercises exactly as told by your health care provider and adjust them as directed. It is normal to feel mild stretching, pulling, tightness, or discomfort as you do these exercises, but you should stop right away if you feel sudden pain or your pain gets worse.Do not begin these exercises until told by your  health care provider. RANGE OF MOTION EXERCISES These exercises  warm up your muscles and joints and improve the movement and flexibility of your injured elbow and forearm. These exercises also help to relieve pain, numbness, and tingling.These exercises are done using the muscles in your injured elbow and forearm. Exercise A: Elbow Flexion, Active 1. Hold your left / right arm at your side, and bend your elbow as far as you can using your left / right arm muscles. 2. Hold this position for __________ seconds. 3. Slowly return to the starting position. Repeat __________ times. Complete this exercise __________ times a day. Exercise B: Elbow Extension, Active 1. Hold your left / right arm at your side, and straighten your elbow as much as you can using your left / right arm muscles. 2. Hold this position for __________ seconds. 3. Slowly return to the starting position. Repeat __________ times. Complete this exercise __________ times a day. Exercise C: Forearm Rotation, Supination, Active 1. Stand or sit with your elbows at your sides. 2. Bend your left / right elbow to an "L" shape (90 degrees). 3. Turn your palm upward until you feel a gentle stretch on the inside of your forearm. 4. Hold this position for __________ seconds. 5. Slowly release and return to the starting position. Repeat __________ times. Complete this exercise __________ times a day. Exercise D: Forearm Rotation, Pronation, Active 1. Stand or sit with your elbows at your side. 2. Bend your left / right elbow to an "L" shape (90 degrees). 3. Turn your left / right palm downward until you feel a gentle stretch on the top of your forearm. 4. Hold this position for __________ seconds. 5. Slowly release and return to the starting position. Repeat__________ times. Complete this exercise __________ times a day. STRETCHING EXERCISES These exercises warm up your muscles and joints and improve the movement and flexibility of your  injured elbow and forearm. These exercises also help to relieve pain, numbness, and tingling.These exercises are done using your healthy elbow and forearm to help stretch the muscles in your injured elbow and forearm. Exercise E: Elbow Flexion, Active-Assisted  1. Hold your left / right arm at your side, and bend your elbow as much as you can using your left / right arm muscles. 2. Use your other hand to bend your left / right elbow farther. To do this, gently push up on your forearm until you feel a gentle stretch on the back of your elbow. 3. Hold this position for __________ seconds. 4. Slowly return to the starting position. Repeat __________ times. Complete this exercise __________ times a day. Exercise F: Elbow Extension, Active-Assisted  1. Hold your left / right arm at your side, and straighten your elbow as much as you can using your left / right arm muscles. 2. Use your other hand to straighten the left / right elbow farther. To do this, gently push down on your forearm until you feel a gentle stretch on the inside of your elbow. 3. Hold this position for __________ seconds. 4. Slowly return to the starting position. Repeat __________ times. Complete this exercise __________ times a day. Exercise G: Forearm Rotation, Supination, Active-Assisted  1. Sit with your left / right elbow bent in an "L" shape (90 degrees) with your forearm resting on a table. 2. Keeping your upper body and shoulder still, rotate your forearm so your left / right palm faces upward. 3. Use your other hand to help rotate your forearm further until you feel a gentle to moderate stretch. 4. Hold this position  for __________ seconds. 5. Slowly release the stretch and return to the starting position. Repeat __________ times. Complete this exercise __________ times a day. Exercise H: Forearm Rotation, Pronation, Active-Assisted  1. Sit with your left / right elbow bent in an "L" shape (90 degrees) with your  forearm resting on a table. 2. Keeping your upper body and shoulder still, rotate your forearm so your palm faces the tabletop. 3. Use your other hand to help rotate your forearm further until you feel a gentle to moderate stretch. 4. Hold this position for __________ seconds. 5. Slowly release the stretch and return to the starting position. Repeat __________ times. Complete this exercise __________ times a day. Exercise I: Elbow Flexion, Supine, Passive 1. Lie on your back. 2. Extend your left / right arm up in the air, bracing it with your other hand. 3. Let your left / right your hand slowly lower toward your shoulder, while your elbow stays pointed toward the ceiling. You should feel a gentle stretch along the back of your upper arm and elbow. 4. If instructed by your health care provider, you may increase the intensity of your stretch by adding a small wrist weight or hand weight. 5. Hold this position for __________ seconds. 6. Slowly return to the starting position. Repeat __________ times. Complete this exercise __________ times a day. Exercise J: Elbow Extension, Supine, Passive  1. Lie on your back. Make sure that you are in a comfortable position that lets you relax your arm muscles. 2. Place a folded towel under your left / right upper arm so your elbow and shoulder are at the same height. Straighten your left / right arm so your elbow does not rest on the bed or towel. 3. Let the weight of your hand stretch your elbow. Keep your arm and chest muscles relaxed. You should feel a stretch on the inside of your elbow. 4. If told by your health care provider, you may increase the intensity of your stretch by adding a small wrist weight or hand weight. 5. Hold this position for__________ seconds. 6. Slowly release the stretch. Repeat __________ times. Complete this exercise __________ times a day. STRENGTHENING EXERCISES These exercises build strength and endurance in your elbow and  forearm. Endurance is the ability to use your muscles for a long time, even after they get tired. Exercise K: Elbow Flexion, Isometric  1. Stand or sit up straight. 2. Bend your left / right elbow in an "L" shape (90 degrees) and turn your palm up so your forearm is at the height of your waist. 3. Place your other hand on top of your forearm. Gently push down as your left / right arm resists. Push as hard as you can with both arms without causing any pain or movement at your left / right elbow. 4. Hold this position for __________ seconds. 5. Slowly release the tension in both arms. Let your muscles relax completely before repeating. Repeat __________ times. Complete this exercise __________ times a day. Exercise L: Elbow Extensors, Isometric  1. Stand or sit up straight. 2. Place your left / right arm so your palm faces your abdomen and it is at the height of your waist. 3. Place your other hand on the underside of your forearm. Gently push up as your left / right arm resists. Push as hard as you can with both arms, without causing any pain or movement at your left / right elbow. 4. Hold this position for __________ seconds. 5.  Slowly release the tension in both arms. Let your muscles relax completely before repeating. Repeat __________ times. Complete this exercise __________ times a day. Exercise M: Elbow Flexion With Forearm Palm Up  1. Sit upright on a firm chair without armrests, or stand. 2. Place your left / right arm at your side with your palm facing forward. 3. Holding a __________weight or gripping a rubber exercise band or tubing, bend your elbow to bring your hand toward your shoulder. 4. Hold this position for __________ seconds. 5. Slowly return to the starting position. Repeat __________times. Complete this exercise __________times a day. Exercise N: Elbow Extension  1. Sit on a firm chair without armrests, or stand. 2. Keeping your upper arms at your sides, bring both  hands up toward your left / right shoulder while you grip a rubber exercise band or tubing. Your left / right hand should be just below the other hand. 3. Straighten your left / right elbow. 4. Hold this position for __________ seconds. 5. Control the resistance of the band or tubing as your hand returns to your side. Repeat __________times. Complete this exercise __________times a day. Exercise O: Forearm Rotation, Supination  1. Sit with your left / right forearm supported on a table. Keep your elbow at waist height. 2. Rest your hand over the edge of the table with your palm facing down. 3. Gently hold a lightweight hammer. 4. Without moving your elbow, slowly rotate your forearm to turn your palm and hand upward to a "thumbs-up" position. 5. Hold this position for __________ seconds. 6. Slowly return to the starting position. Repeat __________times. Complete this exercise __________times a day. Exercise P: Forearm Rotation, Pronation  1. Sit with your left / right forearm supported on a table. Keep your elbow below shoulder height. 2. Rest your hand over the edge of the table with your palm facing up. 3. Gently hold a lightweight hammer. 4. Without moving your elbow, slowly rotate your forearm to turn your palm and hand upward to a "thumbs-up" position. 5. Hold this position for __________seconds. 6. Slowly return to the starting position. Repeat __________times. Complete this exercise __________times a day. This information is not intended to replace advice given to you by your health care provider. Make sure you discuss any questions you have with your health care provider. Document Released: 06/17/2005 Document Revised: 12/12/2015 Document Reviewed: 04/28/2015 Elsevier Interactive Patient Education  [employer].

## 2017-07-10 ENCOUNTER — Ambulatory Visit (INDEPENDENT_AMBULATORY_CARE_PROVIDER_SITE_OTHER): Payer: Medicare Other | Admitting: Family Medicine

## 2017-07-10 ENCOUNTER — Encounter: Payer: Self-pay | Admitting: Family Medicine

## 2017-07-10 VITALS — BP 120/70 | HR 54 | Temp 98.5°F | Resp 14 | Ht 65.0 in | Wt 170.0 lb

## 2017-07-10 DIAGNOSIS — J209 Acute bronchitis, unspecified: Secondary | ICD-10-CM

## 2017-07-10 MED ORDER — BENZONATATE 100 MG PO CAPS: 100.0000 mg | ORAL_CAPSULE | Freq: Three times a day (TID) | ORAL | 0 refills | Status: DC | PRN

## 2017-07-10 NOTE — Patient Instructions (Signed)
Continue to push fluids, practice good hand hygiene, and cover your mouth if you cough.  If you start having fevers, shaking or shortness of breath, seek immediate care.  Artificial tears like Refresh and Systane may be used for comfort. OK to get generic version. Generally people use them every 2-4 hours, but you can use them as much as you want because there is no medication in it.  Warm compresses for the eyes.

## 2017-07-10 NOTE — Progress Notes (Signed)
Pre visit review using our clinic review tool, if applicable. No additional management support is needed unless otherwise documented below in the visit note. 

## 2017-07-10 NOTE — Progress Notes (Signed)
Chief Complaint  Patient presents with  . Sore Throat    ear congestion, cough, mucus production- yellow/green started this week   . Eye Drainage    [name]. here for URI complaints.  Duration: 8 days  Associated symptoms: productive cough, runny/stuffy nose, crusty eyes, ear fullness Denies: sinus pain, ear pain, ear drainage, sore throat, shortness of breath, myalgia and fevers/rigors Treatment to date: INCS, ibuprofen,  Sick contacts: No  ROS:  Const: Denies fevers HEENT: As noted in HPI Lungs: No SOB  Past Medical History:  Diagnosis Date  . Chicken pox   . Erectile dysfunction   . Heart murmur   . Herpes simplex   . Hyperlipidemia    last year 2013 elevated now normal   Family History  Problem Relation Age of Onset  . Pancreatic cancer Mother        pancreatic  . Diabetes Father   . Colon cancer Neg Hx     BP 120/70 (BP Location: Left Arm, Patient Position: Sitting, Cuff Size: Large)   Pulse (!) 54   Temp 98.5 F (36.9 C) (Oral)   Resp 14   Ht 5\' 5"  (1.651 m)   Wt 170 lb (77.1 kg)   SpO2 99%   BMI 28.29 kg/m  General: Awake, alert, appears stated age HEENT: AT, [place], ears patent b/l and TM's neg, nares patent w/o discharge, pharynx pink and without exudates, MMM Neck: No masses or asymmetry Heart: RRR, no murmurs, no bruits Lungs: CTAB, no accessory muscle use Psych: Age appropriate judgment and insight, normal mood and affect  Acute bronchitis, unspecified organism  This is likely viral in etiology.  Tessalon Perles.  For his eyes, warm compresses/artificial tears for symptomatic management. Continue to push fluids, practice good hand hygiene, cover mouth when coughing. F/u prn. If starting to experience fevers, shaking, or shortness of breath, seek immediate care. Pt voiced understanding and agreement to the plan.  [place], DO 07/10/17 12:00 PM

## 2017-08-08 ENCOUNTER — Other Ambulatory Visit: Payer: Self-pay

## 2017-08-08 ENCOUNTER — Encounter (HOSPITAL_COMMUNITY): Payer: Self-pay | Admitting: *Deleted

## 2017-08-08 ENCOUNTER — Emergency Department (HOSPITAL_COMMUNITY)
Admission: EM | Admit: 2017-08-08 | Discharge: 2017-08-08 | Disposition: A | Discharge status: Home / Self Care | Payer: Medicare Other | Attending: Emergency Medicine | Admitting: Emergency Medicine

## 2017-08-08 DIAGNOSIS — H02845 Edema of left lower eyelid: Secondary | ICD-10-CM | POA: Diagnosis present

## 2017-08-08 DIAGNOSIS — H11422 Conjunctival edema, left eye: Secondary | ICD-10-CM

## 2017-08-08 DIAGNOSIS — Z79899 Other long term (current) drug therapy: Secondary | ICD-10-CM | POA: Insufficient documentation

## 2017-08-08 MED ORDER — PREDNISOLONE ACETATE 1 % OP SUSP
1.0000 [drp] | Freq: Four times a day (QID) | OPHTHALMIC | Status: DC
  Administered 2017-08-08: 1 [drp] via OPHTHALMIC
  Filled 2017-08-08: qty 1

## 2017-08-08 NOTE — Discharge Instructions (Signed)
As discussed, today's evaluation has been generally reassuring. You are likely having a chemosis, or inflammatory reaction of your eye. If you develop new or concerning changes in your condition, please be sure to return here. Otherwise, please use the provided eyedrops 4 times daily for the next 3 days and be sure to follow-up with your ophthalmologist.

## 2017-08-08 NOTE — ED Provider Notes (Signed)
[facility] EMERGENCY DEPARTMENT Provider Note   CSN: [id number] Arrival date & time: 08/08/17  1322     History   Chief Complaint Chief Complaint  Patient presents with  . Eye Problem    HPI Clifford Gould. is a 71 y.o. male.  HPI   Patient presents with concern of a left eye lesion. Patient states that he is generally well, has been doing generally well until today. Over the past few hours he has noticed increasing swelling about his left lower eyelid, and left lateral eye surface. No vision loss, no double vision, no pain, no itching. No alleviating or exacerbating factors. Patient is unsure of exact precipitant, but volunteers that today, prior to the onset of symptoms he was handling raw shrimp In the past he has had allergic reaction on his fingers when he has done similar things. Patient does not wear contact lenses in the affected eye, does in the contralateral eye for distance vision.   Past Medical History:  Diagnosis Date  . Chicken pox   . Erectile dysfunction   . Heart murmur   . Herpes simplex   . Hyperlipidemia    last year 2013 elevated now normal    Patient Active Problem List   Diagnosis Date Noted  . Right elbow pain 04/23/2017  . Right foot pain 04/23/2017  . Decreased range of motion of right elbow 04/23/2017  . Obesity 04/21/2016  . Routine general medical examination at a health care facility 04/18/2015  . Medicare annual wellness visit, subsequent 04/18/2015  . Right shoulder pain 09/25/2014  . Herpes simplex 04/16/2014    Past Surgical History:  Procedure Laterality Date  . COLONOSCOPY     15+yrs ago- normal per pt.  . SPLENECTOMY         Home Medications    Prior to Admission medications   Medication Sig Start Date End Date Taking? Authorizing Provider  benzonatate (TESSALON) 100 MG capsule Take 1 capsule (100 mg total) by mouth 3 (three) times daily as needed. 07/10/17   Shelda Pal, DO    clotrimazole-betamethasone (LOTRISONE) cream Apply 1 application topically 2 (two) times daily. 04/23/17   Golden Circle, FNP  diphenhydramine-acetaminophen (TYLENOL PM EXTRA STRENGTH) 25-500 MG TABS tablet Take 1 tablet by mouth at bedtime as needed.    [provider]  IBUPROFEN PO Take by mouth.    [provider]  sildenafil (REVATIO) 20 MG tablet Take 1-5 tablets by mouth daily as needed. 04/23/17   Golden Circle, FNP  valACYclovir (VALTREX) 1000 MG tablet Take 2 tablets by mouth twice daily for 1 day as needed for cold sores 04/23/17   Golden Circle, FNP    Family History Family History  Problem Relation Age of Onset  . Pancreatic cancer Mother        pancreatic  . Diabetes Father   . Colon cancer Neg Hx     Social History Social History   Tobacco Use  . Smoking status: Never Smoker  . Smokeless tobacco: Never Used  Substance Use Topics  . Alcohol use: Yes    Alcohol/week: 1.2 - 2.4 oz    Types: 2 - 4 Cans of beer per week    Comment: occ beer  . Drug use: No     Allergies   Patient has no known allergies.   Review of Systems Review of Systems  Constitutional:       Per HPI, otherwise negative  HENT:  Per HPI, otherwise negative  Eyes: Positive for discharge. Negative for photophobia, pain, redness, itching and visual disturbance.  Respiratory:       Per HPI, otherwise negative  Cardiovascular:       Per HPI, otherwise negative  Gastrointestinal: Negative for vomiting.  Endocrine:       Negative aside from HPI  Genitourinary:       Neg aside from HPI   Musculoskeletal:       Per HPI, otherwise negative  Skin: Negative.   Allergic/Immunologic: Negative for immunocompromised state.  Neurological: Negative for syncope, weakness and headaches.     Physical Exam Updated Vital Signs BP (!) 177/74 (BP Location: Right Arm)   Pulse 61   Temp 98.1 F (36.7 C) (Oral)   Resp 18   SpO2 98%   Physical Exam  Constitutional: He  is oriented to person, place, and time. He appears well-developed. No distress.  HENT:  Head: Normocephalic and atraumatic.    Eyes: Conjunctivae and EOM are normal. Right eye exhibits no chemosis and no discharge. Left eye exhibits chemosis and discharge. Left eye exhibits no exudate. Right conjunctiva is not injected. Right conjunctiva has no hemorrhage. Left conjunctiva is not injected. Left conjunctiva has no hemorrhage. Right eye exhibits normal extraocular motion and no nystagmus. Left eye exhibits normal extraocular motion and no nystagmus. Right pupil is reactive. Left pupil is reactive.  Cardiovascular: Normal rate and regular rhythm.  Pulmonary/Chest: Effort normal. No stridor. No respiratory distress.  Abdominal: He exhibits no distension.  Musculoskeletal: He exhibits no edema.  Neurological: He is alert and oriented to person, place, and time.  Skin: Skin is warm and dry.  Psychiatric: He has a normal mood and affect.  Nursing note and vitals reviewed.    ED Treatments / Results   Procedures Procedures (including critical care time)  Medications Ordered in ED Medications  prednisoLONE acetate (PRED FORTE) 1 % ophthalmic suspension 1 drop (not administered)     Initial Impression / Assessment and Plan / ED Course  I have reviewed the triage vital signs and the nursing notes.  Pertinent labs & imaging results that were available during my care of the patient were reviewed by me and considered in my medical decision making (see chart for details).  Visual acuity unremarkable  This well-appearing elderly male presents with new left eye lesion, no vision change, no pain, no photophobia, no itching or superficial discoloration per Patient has no tenderness to palpation, no other neurologic deficits, there is low suspicion for either giant cell arteritis or other intracranial pathology. Patient's description of prior allergic reaction with raw shrimp suggests possible  etiology for what seems to be ecchymosis. Patient started on topical steroids, will follow up with his ophthalmologist.  Final Clinical Impressions(s) / ED Diagnoses  chemosis, left eye, initial encounter   Carmin Muskrat, MD 08/08/17 [phone]

## 2017-08-08 NOTE — ED Triage Notes (Signed)
Pt reports onset today of feeling like something was in his left eye and he rubbed it, now has large blister area to his eye and swelling. Reports blurred vision.

## 2017-11-24 ENCOUNTER — Encounter: Payer: Self-pay | Admitting: Physician Assistant

## 2017-11-30 DIAGNOSIS — R69 Illness, unspecified: Secondary | ICD-10-CM | POA: Diagnosis not present

## 2017-12-28 DIAGNOSIS — R69 Illness, unspecified: Secondary | ICD-10-CM | POA: Diagnosis not present

## 2018-01-24 DIAGNOSIS — S0501XA Injury of conjunctiva and corneal abrasion without foreign body, right eye, initial encounter: Secondary | ICD-10-CM | POA: Diagnosis not present

## 2018-03-28 DIAGNOSIS — L309 Dermatitis, unspecified: Secondary | ICD-10-CM | POA: Diagnosis not present

## 2018-03-28 DIAGNOSIS — L814 Other melanin hyperpigmentation: Secondary | ICD-10-CM | POA: Diagnosis not present

## 2018-03-28 DIAGNOSIS — L821 Other seborrheic keratosis: Secondary | ICD-10-CM | POA: Diagnosis not present

## 2018-03-28 DIAGNOSIS — D225 Melanocytic nevi of trunk: Secondary | ICD-10-CM | POA: Diagnosis not present

## 2018-04-25 ENCOUNTER — Encounter: Payer: Self-pay | Admitting: Nurse Practitioner

## 2018-04-25 ENCOUNTER — Ambulatory Visit (INDEPENDENT_AMBULATORY_CARE_PROVIDER_SITE_OTHER): Payer: Medicare HMO | Admitting: Nurse Practitioner

## 2018-04-25 ENCOUNTER — Other Ambulatory Visit (INDEPENDENT_AMBULATORY_CARE_PROVIDER_SITE_OTHER): Payer: Medicare HMO

## 2018-04-25 VITALS — BP 160/88 | HR 64 | Ht 65.0 in | Wt 169.0 lb

## 2018-04-25 DIAGNOSIS — Z23 Encounter for immunization: Secondary | ICD-10-CM | POA: Diagnosis not present

## 2018-04-25 DIAGNOSIS — Z125 Encounter for screening for malignant neoplasm of prostate: Secondary | ICD-10-CM

## 2018-04-25 DIAGNOSIS — Z0001 Encounter for general adult medical examination with abnormal findings: Secondary | ICD-10-CM | POA: Diagnosis not present

## 2018-04-25 DIAGNOSIS — R202 Paresthesia of skin: Secondary | ICD-10-CM | POA: Diagnosis not present

## 2018-04-25 DIAGNOSIS — Z13220 Encounter for screening for lipoid disorders: Secondary | ICD-10-CM

## 2018-04-25 DIAGNOSIS — R030 Elevated blood-pressure reading, without diagnosis of hypertension: Secondary | ICD-10-CM

## 2018-04-25 DIAGNOSIS — R7309 Other abnormal glucose: Secondary | ICD-10-CM | POA: Diagnosis not present

## 2018-04-25 LAB — PSA: PSA: 3.57 ng/mL (ref 0.10–4.00)

## 2018-04-25 LAB — HEMOGLOBIN A1C: Hgb A1c MFr Bld: 6 % (ref 4.6–6.5)

## 2018-04-25 LAB — CBC
HCT: 39.4 % (ref 39.0–52.0)
Hemoglobin: 13.5 g/dL (ref 13.0–17.0)
MCHC: 34.2 g/dL (ref 30.0–36.0)
MCV: 95.5 fl (ref 78.0–100.0)
Platelets: 384 10*3/uL (ref 150.0–400.0)
RBC: 4.13 Mil/uL — ABNORMAL LOW (ref 4.22–5.81)
RDW: 12.7 % (ref 11.5–15.5)
WBC: 8.1 10*3/uL (ref 4.0–10.5)

## 2018-04-25 LAB — COMPREHENSIVE METABOLIC PANEL
ALBUMIN: 4.2 g/dL (ref 3.5–5.2)
ALK PHOS: 65 U/L (ref 39–117)
ALT: 15 U/L (ref 0–53)
AST: 23 U/L (ref 0–37)
BILIRUBIN TOTAL: 0.5 mg/dL (ref 0.2–1.2)
BUN: 27 mg/dL — ABNORMAL HIGH (ref 6–23)
CALCIUM: 9.4 mg/dL (ref 8.4–10.5)
CO2: 27 mEq/L (ref 19–32)
CREATININE: 1.45 mg/dL (ref 0.40–1.50)
Chloride: 103 mEq/L (ref 96–112)
GFR: 50.95 mL/min — AB (ref >60.00)
Glucose, Bld: 91 mg/dL (ref 70–99)
Potassium: 4.8 mEq/L (ref 3.5–5.1)
Sodium: 138 mEq/L (ref 135–145)
TOTAL PROTEIN: 7 g/dL (ref 6.0–8.3)

## 2018-04-25 LAB — LIPID PANEL
CHOL/HDL RATIO: 5
CHOLESTEROL: 201 mg/dL — AB (ref 0–200)
HDL: 40.1 mg/dL (ref >39.00)
NonHDL: 160.61
Triglycerides: 212 mg/dL — ABNORMAL HIGH (ref 0.0–149.0)
VLDL: 42.4 mg/dL — ABNORMAL HIGH (ref 0.0–40.0)

## 2018-04-25 LAB — MAGNESIUM: Magnesium: 2.3 mg/dL (ref 1.5–2.5)

## 2018-04-25 LAB — TSH: TSH: 2.9 u[IU]/mL (ref 0.35–4.50)

## 2018-04-25 LAB — VITAMIN B12: VITAMIN B 12: 413 pg/mL (ref 211–911)

## 2018-04-25 LAB — LDL CHOLESTEROL, DIRECT: LDL DIRECT: 131 mg/dL

## 2018-04-25 NOTE — Patient Instructions (Addendum)
Please head downstairs for lab work. If any of your test results are critically abnormal, you will be contacted right away. Otherwise, I will contact you within a week about your test results and follow up recommendations  Please try to check your blood pressure once daily or at least a few times a week, at the same time each day, and keep a log. Please follow up for home readings >140/90.  I will plan to see you back in 1 year, or sooner if needed.   Health Maintenance, Male A healthy lifestyle and preventive care is important for your health and wellness. Ask your health care provider about what schedule of regular examinations is right for you. What should I know about weight and diet? Eat a Healthy Diet  Eat plenty of vegetables, fruits, whole grains, low-fat dairy products, and lean protein.  Do not eat a lot of foods high in solid fats, added sugars, or salt.  Maintain a Healthy Weight Regular exercise can help you achieve or maintain a healthy weight. You should:  Do at least 150 minutes of exercise each week. The exercise should increase your heart rate and make you sweat (moderate-intensity exercise).  Do strength-training exercises at least twice a week.  Watch Your Levels of Cholesterol and Blood Lipids  Have your blood tested for lipids and cholesterol every 5 years starting at 71 years of age. If you are at high risk for heart disease, you should start having your blood tested when you are 71 years old. You may need to have your cholesterol levels checked more often if: ? Your lipid or cholesterol levels are high. ? You are older than 71 years of age. ? You are at high risk for heart disease.  What should I know about cancer screening? Many types of cancers can be detected early and may often be prevented. Lung Cancer  You should be screened every year for lung cancer if: ? You are a current smoker who has smoked for at least 30 years. ? You are a former smoker who  has quit within the past 15 years.  Talk to your health care provider about your screening options, when you should start screening, and how often you should be screened.  Colorectal Cancer  Routine colorectal cancer screening usually begins at 71 years of age and should be repeated every 5-10 years until you are 71 years old. You may need to be screened more often if early forms of precancerous polyps or small growths are found. Your health care provider may recommend screening at an earlier age if you have risk factors for colon cancer.  Your health care provider may recommend using home test kits to check for hidden blood in the stool.  A small camera at the end of a tube can be used to examine your colon (sigmoidoscopy or colonoscopy). This checks for the earliest forms of colorectal cancer.  Prostate and Testicular Cancer  Depending on your age and overall health, your health care provider may do certain tests to screen for prostate and testicular cancer.  Talk to your health care provider about any symptoms or concerns you have about testicular or prostate cancer.  Skin Cancer  Check your skin from head to toe regularly.  Tell your health care provider about any new moles or changes in moles, especially if: ? There is a change in a mole's size, shape, or color. ? You have a mole that is larger than a pencil eraser.  Always  use sunscreen. Apply sunscreen liberally and repeat throughout the day.  Protect yourself by wearing long sleeves, pants, a wide-brimmed hat, and sunglasses when outside.  What should I know about heart disease, diabetes, and high blood pressure?  If you are 16-57 years of age, have your blood pressure checked every 3-5 years. If you are 41 years of age or older, have your blood pressure checked every year. You should have your blood pressure measured twice-once when you are at a hospital or clinic, and once when you are not at a hospital or clinic. Record the  average of the two measurements. To check your blood pressure when you are not at a hospital or clinic, you can use: ? An automated blood pressure machine at a pharmacy. ? A home blood pressure monitor.  Talk to your health care provider about your target blood pressure.  If you are between 45-26 years old, ask your health care provider if you should take aspirin to prevent heart disease.  Have regular diabetes screenings by checking your fasting blood sugar level. ? If you are at a normal weight and have a low risk for diabetes, have this test once every three years after the age of 49. ? If you are overweight and have a high risk for diabetes, consider being tested at a younger age or more often.  A one-time screening for abdominal aortic aneurysm (AAA) by ultrasound is recommended for men aged 55-75 years who are current or former smokers. What should I know about preventing infection? Hepatitis B If you have a higher risk for hepatitis B, you should be screened for this virus. Talk with your health care provider to find out if you are at risk for hepatitis B infection. Hepatitis C Blood testing is recommended for:  Everyone born from [birth year] through 1964.  Anyone with known risk factors for hepatitis C.  Sexually Transmitted Diseases (STDs)  You should be screened each year for STDs including gonorrhea and chlamydia if: ? You are sexually active and are younger than 71 years of age. ? You are older than 71 years of age and your health care provider tells you that you are at risk for this type of infection. ? Your sexual activity has changed since you were last screened and you are at an increased risk for chlamydia or gonorrhea. Ask your health care provider if you are at risk.  Talk with your health care provider about whether you are at high risk of being infected with HIV. Your health care provider may recommend a prescription medicine to help prevent HIV infection.  What else can  I do?  Schedule regular health, dental, and eye exams.  Stay current with your vaccines (immunizations).  Do not use any tobacco products, such as cigarettes, chewing tobacco, and e-cigarettes. If you need help quitting, ask your health care provider.  Limit alcohol intake to no more than 2 drinks per day. One drink equals 12 ounces of beer, 5 ounces of wine, or 1 ounces of hard liquor.  Do not use street drugs.  Do not share needles.  Ask your health care provider for help if you need support or information about quitting drugs.  Tell your health care provider if you often feel depressed.  Tell your health care provider if you have ever been abused or do not feel safe at home. This information is not intended to replace advice given to you by your health care provider. Make sure you discuss any questions  you have with your health care provider. Document Released: 01/30/2008 Document Revised: 04/01/2016 Document Reviewed: 05/07/2015 Elsevier Interactive Patient Education  [employer].

## 2018-04-25 NOTE — Assessment & Plan Note (Signed)
-  Prostate cancer screening and PSA options (with potential risks and benefits of testing vs not testing) were discussed along with recent recs/guidelines. -USPSTF grade A and B recommendations reviewed with patient; age-appropriate recommendations, preventive care, screening tests, etc discussed and encouraged; healthy living encouraged; see AVS for patient education given to patient -Discussed importance of 150 minutes of physical activity weekly, eat 6 servings of fruit/vegetables daily and drink plenty of water and avoid sweet beverages.  -Follow up and care instructions discussed and provided in AVS.  -Reviewed Health Maintenance:  Need for influenza vaccination- Flu vaccine HIGH DOSE PF  Encounter for general adult medical examination with abnormal findings - CBC; Future - Comprehensive metabolic panel; Future - Lipid panel; Future-Screening for cholesterol level- he is fasting - PSA; Future-Screening for prostate cancer - Hemoglobin A1c; Future- Elevated glucose level-noted on past labwork - TSH; Future

## 2018-04-25 NOTE — Progress Notes (Signed)
Name: Clifford Gould.   MRN: [id number]    DOB: 1946-02-27   Date:04/25/2018       Progress Note  Subjective  Chief Complaint CPE  HPI Clifford Gould is here today to establish care as a transfer from another provider in the same practice. Patient presents for annual CPE. He tells me he overall feels well today but does c/o intermittent "pinpick" sensation to his extremities, which he has noticed for sometime now, occurs at various times of day and not seemingly related to any specific activity  USPSTF grade A and B recommendations:  Diet, Exercise: would like to lose some weight, plays tennis and golf, recently started walking and running 2 miles/day, tries to follow a healthy diet and working on cutting back on snacks  Depression: no concerns Depression screen [facility] 2/9 04/25/2018 04/23/2017 04/23/2017 11/21/2016 04/21/2016  Decreased Interest 0 0 0 0 0  Down, Depressed, Hopeless 0 0 0 0 0  PHQ - 2 Score 0 0 0 0 0  Altered sleeping - 0 - - -  Tired, decreased energy - 0 - - -  Change in appetite - 0 - - -  Feeling bad or failure about yourself  - 1 - - -  Trouble concentrating - 1 - - -  Moving slowly or fidgety/restless - 0 - - -  Suicidal thoughts - 0 - - -  PHQ-9 Score - 2 - - -   Hypertension: not maintained on antihypertensives Reports BP is typically elevated in medical settings, but he does routinely check his BP with normal readings around 130s/80s at home BP Readings from Last 3 Encounters:  04/25/18 (!) 160/88  08/08/17 (!) 156/78  07/10/17 120/70   Obesity: Wt Readings from Last 3 Encounters:  04/25/18 169 lb (76.7 kg)  07/10/17 170 lb (77.1 kg)  04/23/17 169 lb 1.9 oz (76.7 kg)   BMI Readings from Last 3 Encounters:  04/25/18 28.12 kg/m  07/10/17 28.29 kg/m  04/23/17 28.14 kg/m    Lipids: lipid panel today Lab Results  Component Value Date   CHOL 148 04/23/2017   CHOL 201 (H) 04/21/2016   CHOL 175 04/18/2015   Lab Results  Component Value Date   HDL 39.10  04/23/2017   HDL 35.70 (L) 04/21/2016   HDL 34.20 (L) 04/18/2015   Lab Results  Component Value Date   LDLCALC 90 04/23/2017   LDLCALC 124 (H) 01/23/2013   Lab Results  Component Value Date   TRIG 96.0 04/23/2017   TRIG 312.0 (H) 04/21/2016   TRIG 236.0 (H) 04/18/2015   Lab Results  Component Value Date   CHOLHDL 4 04/23/2017   CHOLHDL 6 04/21/2016   CHOLHDL 5 04/18/2015   Lab Results  Component Value Date   LDLDIRECT 99.0 04/21/2016   LDLDIRECT 102.0 04/18/2015   LDLDIRECT 116.2 04/16/2014   Glucose: A1c today Glucose, Bld  Date Value Ref Range Status  04/23/2017 106 (H) 70 - 99 mg/dL Final  04/21/2016 112 (H) 70 - 99 mg/dL Final  04/18/2015 100 (H) 70 - 99 mg/dL Final   Alcohol: sometimes one beer a night Tobacco use: no, never  Hep C: done in past  Skin cancer: does not routinely wear sunscreen but does wear hat and long sleeves when outdoors, recent skin check by dermatology  Colorectal cancer: colonoscopy up to date  Prostate cancer: PSA today Lab Results  Component Value Date   PSA 3.51 04/23/2017   PSA 2.70 04/21/2016   PSA 2.65 04/18/2015  Aspirin: not taking ECG:  Not indicated  Vaccinations: influenza vaccination today  Advanced Care Planning: A voluntary discussion about advance care planning including the explanation and discussion of advance directives.  Discussed health care proxy and Living will, and the patient DOES NOT have a living will at present time. If patient does have living will, I have requested they bring this to the clinic to be scanned in to their chart.  Patient Active Problem List   Diagnosis Date Noted  . Right elbow pain 04/23/2017  . Right foot pain 04/23/2017  . Decreased range of motion of right elbow 04/23/2017  . Obesity 04/21/2016  . Routine general medical examination at a health care facility 04/18/2015  . Medicare annual wellness visit, subsequent 04/18/2015  . Right shoulder pain 09/25/2014  . 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 COLONOSCOPY     15+yrs ago- normal per pt.  . SPLENECTOMY      Family History  Problem Relation Age of Onset  . Pancreatic cancer Mother        pancreatic  . Diabetes Father   . Colon cancer Neg Hx     Social History   Socioeconomic History  . Marital status: Married    Spouse name: Not on file  . Number of children: 3  . Years of education: 26  . Highest education level: Not on file  Occupational History  . Occupation: Nurse Aide   Social Needs  . Financial resource strain: Not on file  . Food insecurity:    Worry: Not on file    Inability: Not on file  . Transportation needs:    Medical: Not on file    Non-medical: Not on file  Tobacco Use  . Smoking status: Never Smoker  . Smokeless tobacco: Never Used  Substance and Sexual Activity  . Alcohol use: Yes    Alcohol/week: 2.0 - 4.0 standard drinks    Types: 2 - 4 Cans of beer per week    Comment: occ beer  . Drug use: No  . Sexual activity: Never  Lifestyle  . Physical activity:    Days per week: Not on file    Minutes per session: Not on file  . Stress: Not on file  Relationships  . Social connections:    Talks on phone: Not on file    Gets together: Not on file    Attends religious service: Not on file    Active member of club or organization: Not on file    Attends meetings of clubs or organizations: Not on file    Relationship status: Not on file  . Intimate partner violence:    Fear of current or ex partner: Not on file    Emotionally abused: Not on file    Physically abused: Not on file    Forced sexual activity: Not on file  Other Topics Concern  . Not on file  Social History Narrative   Born and raised in [place], [place]. Currently resides in a house with his wife. 2 dogs (1 boxer, half-lab/pitbull). Fun: Play golf, tennis and read.    Denies religious beliefs that would effect health care.      Current Outpatient  Medications:  .  clotrimazole-betamethasone (LOTRISONE) cream, Apply 1 application topically 2 (two) times daily., Disp: 30 g, Rfl: 1 .  diphenhydramine-acetaminophen (TYLENOL PM EXTRA STRENGTH) 25-500 MG TABS tablet, Take 1 tablet by mouth at bedtime as needed., Disp: ,  Rfl:  .  IBUPROFEN PO, Take by mouth., Disp: , Rfl:  .  sildenafil (REVATIO) 20 MG tablet, Take 1-5 tablets by mouth daily as needed., Disp: 50 tablet, Rfl: 0 .  valACYclovir (VALTREX) 1000 MG tablet, Take 2 tablets by mouth twice daily for 1 day as needed for cold sores, Disp: 8 tablet, Rfl: 0  No Known Allergies   ROS  Constitutional: Negative for fever or weight change.  Respiratory: Negative for cough and shortness of breath.   Cardiovascular: Negative for chest pain or palpitations.  Gastrointestinal: Negative for abdominal pain, no bowel changes.  Musculoskeletal: Negative for gait problem or joint swelling.  Skin: Negative for rash.  Neurological: Negative for dizziness or headache.  No other specific complaints in a complete review of systems (except as listed in HPI above).   Objective  Vitals:   04/25/18 0759  BP: (!) 160/88  Pulse: 64  SpO2: 95%  Weight: 169 lb (76.7 kg)  Height: 5\' 5"  (1.651 m)    Body mass index is 28.12 kg/m.  Physical Exam VItal signs reviewed. Constitutional: Patient appears well-developed and well-nourished. No distress.  HENT: Head: Normocephalic and atraumatic. Ears: B TMs ok, no erythema or effusion; Nose: Nose normal. Mouth/Throat: Oropharynx is clear and moist. No oropharyngeal exudate.  Eyes: Conjunctivae and EOM are normal. Pupils are equal, round, and reactive to light. No scleral icterus.  Neck: Normal range of motion. Neck supple. No cervical adenopathy. No thyromegaly present.  Cardiovascular: Normal rate, regular rhythm and normal heart sounds.  No murmur heard. No BLE edema. Distal pulses intact. Pulmonary/Chest: Effort normal and breath sounds normal. No  respiratory distress. Abdominal: Soft, no distension Musculoskeletal: Normal range of motion, No gross deformities Neurological: he is alert and oriented to person, place, and time. No cranial nerve deficit. Coordination, balance, strength, speech and gait are normal.  Skin: Skin is warm and dry. No rash noted. No erythema.  Psychiatric: Patient has a normal mood and affect. behavior is normal. Judgment and thought content normal.   Fall Risk: Fall Risk  04/25/2018 04/23/2017 11/21/2016 04/21/2016 02/02/2014  Falls in the past year? No No No No Yes  Comment - - - - fell off a ladder  Number falls in past yr: - - - - 1  Injury with Fall? - - - - Yes    Assessment & Plan RTC in 1 year for CPE  Elevated blood pressure reading Noted today and on past clinical readings with reportedly normal readings at home, he does not want to return for follow up but says he will continue to monitor BP readings at home and follow up for readings >140/90 - CBC; Future - Comprehensive metabolic panel; Future - Lipid panel; Future - TSH; Future  Tingling in extremities Labs ordered today F/U with further recommendations pending lab results - Hemoglobin A1c; Future - TSH; Future - Vitamin B12; Future - Magnesium; Future

## 2018-05-03 ENCOUNTER — Other Ambulatory Visit: Payer: Self-pay | Admitting: Nurse Practitioner

## 2018-05-03 DIAGNOSIS — R202 Paresthesia of skin: Secondary | ICD-10-CM

## 2018-05-09 ENCOUNTER — Other Ambulatory Visit: Payer: Self-pay | Admitting: Nurse Practitioner

## 2018-05-09 DIAGNOSIS — E785 Hyperlipidemia, unspecified: Secondary | ICD-10-CM

## 2018-05-09 DIAGNOSIS — R7303 Prediabetes: Secondary | ICD-10-CM

## 2018-06-14 ENCOUNTER — Encounter: Payer: Self-pay | Admitting: Family

## 2018-06-14 ENCOUNTER — Ambulatory Visit (INDEPENDENT_AMBULATORY_CARE_PROVIDER_SITE_OTHER): Payer: Medicare HMO | Admitting: Family

## 2018-06-14 VITALS — BP 138/60 | HR 74 | Temp 98.1°F | Ht 65.0 in | Wt 165.1 lb

## 2018-06-14 DIAGNOSIS — H8110 Benign paroxysmal vertigo, unspecified ear: Secondary | ICD-10-CM | POA: Diagnosis not present

## 2018-06-14 MED ORDER — MECLIZINE HCL 25 MG PO TABS: 25.0000 mg | ORAL_TABLET | Freq: Three times a day (TID) | ORAL | 0 refills | Status: DC | PRN

## 2018-06-14 NOTE — Progress Notes (Signed)
Clifford Gould. is a 72 y.o. male with the following history as recorded in EpicCare:  Patient Active Problem List   Diagnosis Date Noted  . Decreased range of motion of right elbow 04/23/2017  . Obesity 04/21/2016  . Routine general medical examination at a health care facility 04/18/2015  . Medicare annual wellness visit, subsequent 04/18/2015  . Herpes simplex 04/16/2014    Current Outpatient Medications  Medication Sig Dispense Refill  . diphenhydramine-acetaminophen (TYLENOL PM EXTRA STRENGTH) 25-500 MG TABS tablet Take 1 tablet by mouth at bedtime as needed.    . IBUPROFEN PO Take by mouth.    . sildenafil (REVATIO) 20 MG tablet Take 1-5 tablets by mouth daily as needed. 50 tablet 0  . meclizine (ANTIVERT) 25 MG tablet Take 1 tablet (25 mg total) by mouth 3 (three) times daily as needed for dizziness. 30 tablet 0  . valACYclovir (VALTREX) 1000 MG tablet Take 2 tablets by mouth twice daily for 1 day as needed for cold sores (Patient not taking: Reported on 06/14/2018) 8 tablet 0   No current facility-administered medications for this visit.     Allergies: Patient has no known allergies.  Past Medical History:  Diagnosis Date  . Chicken pox   . Erectile dysfunction   . Heart murmur   . Herpes simplex   . Hyperlipidemia    last year 2013 elevated now normal    Past Surgical History:  Procedure Laterality Date  . COLONOSCOPY     15+yrs ago- normal per pt.  . SPLENECTOMY      Family History  Problem Relation Age of Onset  . Pancreatic cancer Mother        pancreatic  . Diabetes Father   . Colon cancer Neg Hx     Social History   Tobacco Use  . Smoking status: Never Smoker  . Smokeless tobacco: Never Used  Substance Use Topics  . Alcohol use: Yes    Alcohol/week: 2.0 - 4.0 standard drinks    Types: 2 - 4 Cans of beer per week    Comment: occ beer    Subjective:  Patient presents with episodes of vertigo "on and off" for the past 2 weeks; feels like the  room is spinning around him; right ear feels more affected; right ear "feels heavy." Notes that symptoms are worse with sudden movements of his head; Episodes seem to be occurring more frequently in the past few days; increased nausea/ increased loose stools; has had vertigo 2 other times in the past; no chest pain, no irregular heartbeat, no blurred vision; Extensive labs done at CPE at end of September;     Objective:  Vitals:   06/14/18 1522  BP: 138/60  Pulse: 74  Temp: 98.1 F (36.7 C)  TempSrc: Oral  SpO2: 95%  Weight: 165 lb 1.3 oz (74.9 kg)  Height: 5\' 5"  (1.651 m)    General: Well developed, well nourished, in no acute distress  Skin : Warm and dry.  Head: Normocephalic and atraumatic  Eyes: Sclera and conjunctiva clear; pupils round and reactive to light; extraocular movements intact  Ears: External normal; canals clear; tympanic membranes normal  Oropharynx: Pink, supple. No suspicious lesions  Neck: Supple without thyromegaly, adenopathy  Lungs: Respirations unlabored; clear to auscultation bilaterally without wheeze, rales, rhonchi  CVS exam: normal rate and regular rhythm.  Neurologic: Alert and oriented; speech intact; face symmetrical; moves all extremities well; CNII-XII intact without focal deficit  Assessment:  1. Benign paroxysmal  positional vertigo, unspecified laterality     Plan:  Trial of Antivert 25 mg tid prn; if symptoms persist, will need to consider head CT and/ or referral to neurology/ vestibular therapy; follow up to be determined.   No follow-ups on file.  No orders of the defined types were placed in this encounter.   Requested Prescriptions   Signed Prescriptions Disp Refills  . meclizine (ANTIVERT) 25 MG tablet 30 tablet 0    Sig: Take 1 tablet (25 mg total) by mouth 3 (three) times daily as needed for dizziness.

## 2018-06-14 NOTE — Patient Instructions (Addendum)

## 2018-06-21 ENCOUNTER — Encounter: Payer: Self-pay | Admitting: Family

## 2018-06-22 ENCOUNTER — Encounter: Payer: Self-pay | Admitting: Family

## 2018-06-22 ENCOUNTER — Other Ambulatory Visit: Payer: Self-pay | Admitting: Family

## 2018-06-22 DIAGNOSIS — R42 Dizziness and giddiness: Secondary | ICD-10-CM

## 2018-06-22 MED ORDER — MECLIZINE HCL 25 MG PO TABS: 25.0000 mg | ORAL_TABLET | Freq: Three times a day (TID) | ORAL | 0 refills | Status: DC | PRN

## 2018-08-03 DIAGNOSIS — R69 Illness, unspecified: Secondary | ICD-10-CM | POA: Diagnosis not present

## 2018-09-15 DIAGNOSIS — H5213 Myopia, bilateral: Secondary | ICD-10-CM | POA: Diagnosis not present

## 2018-09-20 ENCOUNTER — Encounter (HOSPITAL_COMMUNITY): Payer: Self-pay

## 2018-09-20 ENCOUNTER — Encounter: Payer: Self-pay | Admitting: Family

## 2018-09-20 ENCOUNTER — Ambulatory Visit (HOSPITAL_COMMUNITY)
Admission: RE | Admit: 2018-09-20 | Discharge: 2018-09-20 | Disposition: A | Discharge status: Home / Self Care | Payer: Medicare HMO | Source: Ambulatory Visit | Attending: Cardiology | Admitting: Cardiology

## 2018-09-20 ENCOUNTER — Ambulatory Visit (INDEPENDENT_AMBULATORY_CARE_PROVIDER_SITE_OTHER): Payer: Medicare HMO | Admitting: Family

## 2018-09-20 VITALS — BP 136/78 | HR 51 | Temp 98.2°F | Ht 65.0 in | Wt 178.0 lb

## 2018-09-20 DIAGNOSIS — M7989 Other specified soft tissue disorders: Secondary | ICD-10-CM

## 2018-09-20 NOTE — Progress Notes (Signed)
Right lower venous has been completed and is negative for DVT. Preliminary results can be found under CV proc through chart review.  Mahala Rommel RVT [facility]  

## 2018-09-20 NOTE — Progress Notes (Signed)
Clifford Gould. is a 73 y.o. male with the following history as recorded in EpicCare:  Patient Active Problem List   Diagnosis Date Noted  . Decreased range of motion of right elbow 04/23/2017  . Obesity 04/21/2016  . Routine general medical examination at a health care facility 04/18/2015  . Medicare annual wellness visit, subsequent 04/18/2015  . Herpes simplex 04/16/2014    Current Outpatient Medications  Medication Sig Dispense Refill  . diphenhydramine-acetaminophen (TYLENOL PM EXTRA STRENGTH) 25-500 MG TABS tablet Take 1 tablet by mouth at bedtime as needed.    . IBUPROFEN PO Take by mouth.    . meclizine (ANTIVERT) 25 MG tablet Take 1 tablet (25 mg total) by mouth 3 (three) times daily as needed for dizziness. 30 tablet 0  . sildenafil (REVATIO) 20 MG tablet Take 1-5 tablets by mouth daily as needed. 50 tablet 0  . valACYclovir (VALTREX) 1000 MG tablet Take 2 tablets by mouth twice daily for 1 day as needed for cold sores (Patient not taking: Reported on 06/14/2018) 8 tablet 0   No current facility-administered medications for this visit.     Allergies: Patient has no known allergies.  Past Medical History:  Diagnosis Date  . Chicken pox   . Erectile dysfunction   . Heart murmur   . Herpes simplex   . Hyperlipidemia    last year 2013 elevated now normal    Past Surgical History:  Procedure Laterality Date  . COLONOSCOPY     15+yrs ago- normal per pt.  . SPLENECTOMY      Family History  Problem Relation Age of Onset  . Pancreatic cancer Mother        pancreatic  . Diabetes Father   . Colon cancer Neg Hx     Social History   Tobacco Use  . Smoking status: Never Smoker  . Smokeless tobacco: Never Used  Substance Use Topics  . Alcohol use: Yes    Alcohol/week: 2.0 - 4.0 standard drinks    Types: 2 - 4 Cans of beer per week    Comment: occ beer    Subjective:  3 day history of right calf pain swelling/ right knee pain; notes that area is tender to touch;  states he originally injured the right knee a few weeks ago while playing tennis; became very concerned about amount of swelling noted last night in the right leg; no chest pain, no shortness of breath;      Objective:  Vitals:   09/20/18 1049  BP: 136/78  Pulse: (!) 51  Temp: 98.2 F (36.8 C)  TempSrc: Oral  SpO2: 96%  Weight: 178 lb (80.7 kg)  Height: 5\' 5"  (1.651 m)    General: Well developed, well nourished, in no acute distress  Skin : Warm and dry.  Head: Normocephalic and atraumatic  Lungs: Respirations unlabored; clear to auscultation bilaterally without wheeze, rales, rhonchi  Musculoskeletal: No deformities; no active joint inflammation; mild swelling is noted in right lower extremity; negative Homan's sign; Extremities: No edema, cyanosis, clubbing  Vessels: Symmetric bilaterally  Neurologic: Alert and oriented; speech intact; face symmetrical; moves all extremities well; CNII-XII intact without focal deficit   Assessment:  1. Swelling of right lower extremity     Plan:  Update venous doppler today to rule out DVT; more suspicious for right knee as source of symptoms- will most likely refer to sports medicine.  No follow-ups on file.  No orders of the defined types were placed in this  encounter.   Requested Prescriptions    No prescriptions requested or ordered in this encounter

## 2018-09-21 ENCOUNTER — Encounter: Payer: Self-pay | Admitting: Family

## 2018-10-15 ENCOUNTER — Telehealth: Payer: Self-pay | Admitting: Emergency Medicine

## 2018-10-15 NOTE — Telephone Encounter (Signed)
Pt would like a call back to schedule annual wellness visit.   CB [phone]

## 2018-10-20 NOTE — Progress Notes (Signed)
Subjective:   Clifford Gould. is a 73 y.o. male who presents for Medicare Annual/Subsequent preventive examination.  Review of Systems:  No ROS.  Medicare Wellness Visit. Additional risk factors are reflected in the social history.  Cardiac Risk Factors include: dyslipidemia;male gender;advanced age (>34men, >39 women) Sleep patterns: gets up 1-2 times nightly to void and sleeps 6-7 hours nightly.    Home Safety/Smoke Alarms: Feels safe in home. Smoke alarms in place.  Living environment; residence and Firearm Safety: 1-story house/ trailer. Lives alone, no needs for DME, good support system Seat Belt Safety/Bike Helmet: Wears seat belt.   PSA-  Lab Results  Component Value Date   PSA 3.57 04/25/2018   PSA 3.51 04/23/2017   PSA 2.70 04/21/2016       Objective:    Vitals: BP 128/70   Pulse (!) 55   Resp 17   Ht 5\' 5"  (1.651 m)   Wt 167 lb (75.8 kg)   SpO2 98%   BMI 27.79 kg/m   Body mass index is 27.79 kg/m.  Advanced Directives 10/24/2018  Does Patient Have a Medical Advance Directive? No  Would patient like information on creating a medical advance directive? No - Patient declined    Tobacco Social History   Tobacco Use  Smoking Status Never Smoker  Smokeless Tobacco Never Used     Counseling given: Not Answered  Past Medical History:  Diagnosis Date  . Chicken pox   . Erectile dysfunction   . Heart murmur   . Herpes simplex   . Hyperlipidemia    last year 2013 elevated now normal   Past Surgical History:  Procedure Laterality Date  . COLONOSCOPY     15+yrs ago- normal per pt.  . SPLENECTOMY     Family History  Problem Relation Age of Onset  . Pancreatic cancer Mother        pancreatic  . Diabetes Father   . Colon cancer Neg Hx    Social History   Socioeconomic History  . Marital status: Married    Spouse name: Not on file  . Number of children: 3  . Years of education: 81  . Highest education level: Not on file  Occupational  History  . Occupation: Nurse Aide   Social Needs  . Financial resource strain: Not hard at all  . Food insecurity:    Worry: Never true    Inability: Never true  . Transportation needs:    Medical: No    Non-medical: No  Tobacco Use  . Smoking status: Never Smoker  . Smokeless tobacco: Never Used  Substance and Sexual Activity  . Alcohol use: Yes    Alcohol/week: 2.0 - 4.0 standard drinks    Types: 2 - 4 Cans of beer per week    Comment: occ beer  . Drug use: No  . Sexual activity: Never  Lifestyle  . Physical activity:    Days per week: 4 days    Minutes per session: 50 min  . Stress: Not at all  Relationships  . Social connections:    Talks on phone: More than three times a week    Gets together: More than three times a week    Attends religious service: More than 4 times per year    Active member of club or organization: Yes    Attends meetings of clubs or organizations: More than 4 times per year    Relationship status: Widowed  Other Topics Concern  .  Not on file  Social History Narrative   Born and raised in [place], [place]. Currently resides in a house with his wife. 2 dogs (1 boxer, half-lab/pitbull). Fun: Play golf, tennis and read.    Denies religious beliefs that would effect health care.     Outpatient Encounter Medications as of 10/24/2018  Medication Sig  . diphenhydramine-acetaminophen (TYLENOL PM EXTRA STRENGTH) 25-500 MG TABS tablet Take 1 tablet by mouth at bedtime as needed.  . IBUPROFEN PO Take by mouth.  Marland Kitchen MAGNESIUM CARBONATE PO Take 1 tablet by mouth.  . meclizine (ANTIVERT) 25 MG tablet Take 1 tablet (25 mg total) by mouth 3 (three) times daily as needed for dizziness.  . sildenafil (REVATIO) 20 MG tablet Take 1-5 tablets by mouth daily as needed.  . valACYclovir (VALTREX) 1000 MG tablet Take 2 tablets by mouth twice daily for 1 day as needed for cold sores   No facility-administered encounter medications on file as of 10/24/2018.     Activities of  Daily Living In your present state of health, do you have any difficulty performing the following activities: 10/24/2018  Hearing? N  Vision? N  Difficulty concentrating or making decisions? N  Walking or climbing stairs? N  Dressing or bathing? N  Doing errands, shopping? N  Preparing Food and eating ? N  Using the Toilet? N  In the past six months, have you accidently leaked urine? N  Do you have problems with loss of bowel control? N  Managing your Medications? N  Managing your Finances? N  Housekeeping or managing your Housekeeping? N  Some recent data might be hidden    Patient Care Team: Lance Sell, NP as PCP - General (Nurse Practitioner)   Assessment:   This is a routine wellness examination for Clifford Gould. Physical assessment deferred to PCP.  Exercise Activities and Dietary recommendations Current Exercise Habits: Structured exercise class, Type of exercise: walking;strength training/weights(golf, pickle ball), Time (Minutes): 50, Frequency (Times/Week): 4, Weekly Exercise (Minutes/Week): 200, Intensity: Mild, Exercise limited by: orthopedic condition(s)  Diet (meal preparation, eat out, water intake, caffeinated beverages, dairy products, fruits and vegetables): in general, a "healthy" diet  , well balanced. eats a variety of fruits and vegetables daily, limits salt, fat/cholesterol, sugar,carbohydrates,caffeine.  Encouraged patient to increase daily water and healthy fluid intake.  Goals    . Patient Stated     Continue to eat healthy and exercise and stay healthy.       Fall Risk Fall Risk  10/24/2018 04/25/2018 04/23/2017 11/21/2016 04/21/2016  Falls in the past year? 0 No No No No  Comment - - - - -  Number falls in past yr: - - - - -  Injury with Fall? - - - - -   Depression Screen PHQ 2/9 Scores 10/24/2018 04/25/2018 04/23/2017 04/23/2017  PHQ - 2 Score 0 0 0 0  PHQ- 9 Score - - 2 -    Cognitive Function       Ad8 score reviewed for issues:  Issues making  decisions: no  Less interest in hobbies / activities: no  Repeats questions, stories (family complaining): no  Trouble using ordinary gadgets (microwave, computer, phone):no  Forgets the month or year: no  Mismanaging finances: no  Remembering appts: no  Daily problems with thinking and/or memory: no Ad8 score is= 0  Immunization History  Administered Date(s) Administered  . Influenza, High Dose Seasonal PF 04/25/2018  . Pneumococcal Conjugate-13 04/16/2014  . Pneumococcal Polysaccharide-23 12/24/2011  . Tetanus 12/24/2011  .  Zoster Recombinat (Shingrix) 05/04/2017, 09/22/2017   Screening Tests Health Maintenance  Topic Date Due  . TETANUS/TDAP  12/23/2021  . COLONOSCOPY  04/26/2023  . INFLUENZA VACCINE  Completed  . Hepatitis C Screening  Completed      Plan:    Reviewed health maintenance screenings with patient today and relevant education, vaccines, and/or referrals were provided.   Continue doing brain stimulating activities (puzzles, reading, adult coloring books, staying active) to keep memory sharp.   Continue to eat heart healthy diet (full of fruits, vegetables, whole grains, lean protein, water--limit salt, fat, and sugar intake) and increase physical activity as tolerated.  I have personally reviewed and noted the following in the patient's chart:   . Medical and social history . Use of alcohol, tobacco or illicit drugs  . Current medications and supplements . Functional ability and status . Nutritional status . Physical activity . Advanced directives . List of other physicians . Vitals . Screenings to include cognitive, depression, and falls . Referrals and appointments  In addition, I have reviewed and discussed with patient certain preventive protocols, quality metrics, and best practice recommendations. A written personalized care plan for preventive services as well as general preventive health recommendations were provided to patient.      Michiel Cowboy, RN  10/24/2018

## 2018-10-24 ENCOUNTER — Telehealth: Payer: Self-pay | Admitting: *Deleted

## 2018-10-24 ENCOUNTER — Ambulatory Visit (INDEPENDENT_AMBULATORY_CARE_PROVIDER_SITE_OTHER): Payer: Medicare HMO | Admitting: *Deleted

## 2018-10-24 VITALS — BP 128/70 | HR 55 | Resp 17 | Ht 65.0 in | Wt 167.0 lb

## 2018-10-24 DIAGNOSIS — Z0000 Encounter for general adult medical examination without abnormal findings: Secondary | ICD-10-CM

## 2018-10-24 NOTE — Telephone Encounter (Signed)
Called and scheduled an appointment for patient to see Dr. Raeford Razor on 10/28/18 to follow-up regarding right knee swelling and pain.

## 2018-10-24 NOTE — Telephone Encounter (Signed)
Can we help him schedule a follow up appointment for further evaluation here with Dr Tamala Julian or Dr Raeford Razor?

## 2018-10-24 NOTE — Telephone Encounter (Signed)
During AWV, patient stated that he continues to experience pain and swelling in his right knee and some swelling in his right leg but better. Patient asked if he could be referred to a specialist or perhaps sports medicine for follow-up. Patient was recently seen by Provider Clifford Gould regarding right leg and knee swelling.

## 2018-10-24 NOTE — Progress Notes (Signed)
Medical screening examination/treatment/procedure(s) were performed by the Wellness Coach, RN. As primary care provider I was immediately available for consulation/collaboration. I agree with above documentation. Bobak Oguinn, NP  

## 2018-10-24 NOTE — Patient Instructions (Signed)
Continue doing brain stimulating activities (puzzles, reading, adult coloring books, staying active) to keep memory sharp.   Continue to eat heart healthy diet (full of fruits, vegetables, whole grains, lean protein, water--limit salt, fat, and sugar intake) and increase physical activity as tolerated.   Clifford Gould , Thank you for taking time to come for your Medicare Wellness Visit. I appreciate your ongoing commitment to your health goals. Please review the following plan we discussed and let me know if I can assist you in the future.   These are the goals we discussed: Goals    . Patient Stated     Continue to eat healthy and exercise and stay healthy.       This is a list of the screening recommended for you and due dates:  Health Maintenance  Topic Date Due  . Tetanus Vaccine  12/23/2021  . Colon Cancer Screening  04/26/2023  . Flu Shot  Completed  .  Hepatitis C: One time screening is recommended by Center for Disease Control  (CDC) for  adults born from [birth year] through 1964.   Completed    Preventive Care 46 Years and Older, Male Preventive care refers to lifestyle choices and visits with your health care provider that can promote health and wellness. What does preventive care include?   A yearly physical exam. This is also called an annual well check.  Dental exams once or twice a year.  Routine eye exams. Ask your health care provider how often you should have your eyes checked.  Personal lifestyle choices, including: ? Daily care of your teeth and gums. ? Regular physical activity. ? Eating a healthy diet. ? Avoiding tobacco and drug use. ? Limiting alcohol use. ? Practicing safe sex. ? Taking low doses of aspirin every day. ? Taking vitamin and mineral supplements as recommended by your health care provider. What happens during an annual well check? The services and screenings done by your health care provider during your annual well check will depend on your age,  overall health, lifestyle risk factors, and family history of disease. Counseling Your health care provider may ask you questions about your:  Alcohol use.  Tobacco use.  Drug use.  Emotional well-being.  Home and relationship well-being.  Sexual activity.  Eating habits.  History of falls.  Memory and ability to understand (cognition).  Work and work Statistician. Screening You may have the following tests or measurements:  Height, weight, and BMI.  Blood pressure.  Lipid and cholesterol levels. These may be checked every 5 years, or more frequently if you are over 34 years old.  Skin check.  Lung cancer screening. You may have this screening every year starting at age 73 if you have a 30-pack-year history of smoking and currently smoke or have quit within the past 15 years.  Colorectal cancer screening. All adults should have this screening starting at age 17 and continuing until age 61. You will have tests every 1-10 years, depending on your results and the type of screening test. People at increased risk should start screening at an earlier age. Screening tests may include: ? Guaiac-based fecal occult blood testing. ? Fecal immunochemical test (FIT). ? Stool DNA test. ? Virtual colonoscopy. ? Sigmoidoscopy. During this test, a flexible tube with a tiny camera (sigmoidoscope) is used to examine your rectum and lower colon. The sigmoidoscope is inserted through your anus into your rectum and lower colon. ? Colonoscopy. During this test, a long, thin, flexible tube with a  tiny camera (colonoscope) is used to examine your entire colon and rectum.  Prostate cancer screening. Recommendations will vary depending on your family history and other risks.  Hepatitis C blood test.  Hepatitis B blood test.  Sexually transmitted disease (STD) testing.  Diabetes screening. This is done by checking your blood sugar (glucose) after you have not eaten for a while (fasting). You  may have this done every 1-3 years.  Abdominal aortic aneurysm (AAA) screening. You may need this if you are a current or former smoker.  Osteoporosis. You may be screened starting at age 64 if you are at high risk. Talk with your health care provider about your test results, treatment options, and if necessary, the need for more tests. Vaccines Your health care provider may recommend certain vaccines, such as:  Influenza vaccine. This is recommended every year.  Tetanus, diphtheria, and acellular pertussis (Tdap, Td) vaccine. You may need a Td booster every 10 years.  Varicella vaccine. You may need this if you have not been vaccinated.  Zoster vaccine. You may need this after age 42.  Measles, mumps, and rubella (MMR) vaccine. You may need at least one dose of MMR if you were born in 1957 or later. You may also need a second dose.  Pneumococcal 13-valent conjugate (PCV13) vaccine. One dose is recommended after age 59.  Pneumococcal polysaccharide (PPSV23) vaccine. One dose is recommended after age 14.  Meningococcal vaccine. You may need this if you have certain conditions.  Hepatitis A vaccine. You may need this if you have certain conditions or if you travel or work in places where you may be exposed to hepatitis A.  Hepatitis B vaccine. You may need this if you have certain conditions or if you travel or work in places where you may be exposed to hepatitis B.  Haemophilus influenzae type b (Hib) vaccine. You may need this if you have certain risk factors. Talk to your health care provider about which screenings and vaccines you need and how often you need them. This information is not intended to replace advice given to you by your health care provider. Make sure you discuss any questions you have with your health care provider. Document Released: 08/30/2015 Document Revised: 09/23/2017 Document Reviewed: 06/04/2015 Elsevier Interactive Patient Education  2019 [employer].

## 2018-10-28 ENCOUNTER — Ambulatory Visit (INDEPENDENT_AMBULATORY_CARE_PROVIDER_SITE_OTHER): Payer: Medicare HMO | Admitting: Family Medicine

## 2018-10-28 ENCOUNTER — Encounter: Payer: Self-pay | Admitting: Family Medicine

## 2018-10-28 ENCOUNTER — Other Ambulatory Visit: Payer: Self-pay

## 2018-10-28 VITALS — BP 138/82 | HR 62 | Resp 16 | Ht 65.0 in | Wt 170.0 lb

## 2018-10-28 DIAGNOSIS — G8929 Other chronic pain: Secondary | ICD-10-CM | POA: Insufficient documentation

## 2018-10-28 DIAGNOSIS — M25561 Pain in right knee: Secondary | ICD-10-CM | POA: Diagnosis not present

## 2018-10-28 DIAGNOSIS — M25562 Pain in left knee: Secondary | ICD-10-CM | POA: Insufficient documentation

## 2018-10-28 NOTE — Progress Notes (Signed)
Clifford Gould. - 73 y.o. male MRN [id number]  Date of birth: 11-08-1945  SUBJECTIVE:  Including CC & ROS.  No chief complaint on file.   Clifford Gould. is a 73 y.o. male that is  Presenting with right knee pain and swelling. Has been ongoing for the past 2 weeks. Denies an inciting event. Has limitations with flexion. Had a doppler that was negative. Had significant swelling of the calf previously. .     Review of Systems  Constitutional: Negative for fever.  HENT: Negative for congestion.   Respiratory: Negative for cough.   Cardiovascular: Negative for chest pain.  Gastrointestinal: Negative for abdominal pain.  Musculoskeletal: Positive for joint swelling.  Skin: Negative for color change.  Neurological: Negative for weakness.  Hematological: Negative for adenopathy.  Psychiatric/Behavioral: Negative for agitation.    HISTORY: Past Medical, Surgical, Social, and Family History Reviewed & Updated per EMR.   Pertinent Historical Findings include:  Past Medical History:  Diagnosis Date  . Chicken pox   . Erectile dysfunction   . Heart murmur   . Herpes simplex   . Hyperlipidemia    last year 2013 elevated now normal    Past Surgical History:  Procedure Laterality Date  . COLONOSCOPY     15+yrs ago- normal per pt.  . SPLENECTOMY      No Known Allergies  Family History  Problem Relation Age of Onset  . Pancreatic cancer Mother        pancreatic  . Diabetes Father   . Colon cancer Neg Hx      Social History   Socioeconomic History  . Marital status: Married    Spouse name: Not on file  . Number of children: 3  . Years of education: 97  . Highest education level: Not on file  Occupational History  . Occupation: Nurse Aide   Social Needs  . Financial resource strain: Not hard at all  . Food insecurity:    Worry: Never true    Inability: Never true  . Transportation needs:    Medical: No    Non-medical: No  Tobacco Use  . Smoking status:  Never Smoker  . Smokeless tobacco: Never Used  Substance and Sexual Activity  . Alcohol use: Yes    Alcohol/week: 2.0 - 4.0 standard drinks    Types: 2 - 4 Cans of beer per week    Comment: occ beer  . Drug use: No  . Sexual activity: Never  Lifestyle  . Physical activity:    Days per week: 4 days    Minutes per session: 50 min  . Stress: Not at all  Relationships  . Social connections:    Talks on phone: More than three times a week    Gets together: More than three times a week    Attends religious service: More than 4 times per year    Active member of club or organization: Yes    Attends meetings of clubs or organizations: More than 4 times per year    Relationship status: Widowed  . Intimate partner violence:    Fear of current or ex partner: Not on file    Emotionally abused: Not on file    Physically abused: Not on file    Forced sexual activity: Not on file  Other Topics Concern  . Not on file  Social History Narrative   Born and raised in [place], [place]. Currently resides in a house with his wife. 2 dogs (  1 boxer, half-lab/pitbull). Fun: Play golf, tennis and read.    Denies religious beliefs that would effect health care.      PHYSICAL EXAM:  VS: BP 138/82   Pulse 62   Resp 16   Ht 5\' 5"  (1.651 m)   Wt 170 lb (77.1 kg)   SpO2 97%   BMI 28.29 kg/m  Physical Exam Gen: NAD, alert, cooperative with exam, well-appearing ENT: normal lips, normal nasal mucosa,  Eye: normal EOM, normal conjunctiva and lids CV:  no edema, +2 pedal pulses   Resp: no accessory muscle use, non-labored,  Skin: no rashes, no areas of induration  Neuro: normal tone, normal sensation to touch Psych:  normal insight, alert and oriented MSK:  Right knee:  No obvious effusion  Limited flexion to 110 degrees  Normal extension  No instability  Normal strength to resistance  No pain with patellar grind  Neurovascularly intact   Limited ultrasound: right knee:  Mild to moderate  effusion in SPP  Normal appearing QT and PT  Normal medial joint space. Degenerative changes of the medial meniscus  Findings of small Baker's cyst  Summary: degenerative meniscal findings and baker's cyst   Ultrasound and interpretation by Clearance Coots, MD         ASSESSMENT & PLAN:   Acute pain of right knee Swelling is likely related to the Baker's cyst.  Likely has mild degenerative changes as well as degenerative meniscus.  Doppler has been negative this point. -Counseled on home exercise therapy and supportive care. -Counseled on compression. -If no improvement can consider injection or imaging.

## 2018-10-28 NOTE — Assessment & Plan Note (Signed)
Swelling is likely related to the Baker's cyst.  Likely has mild degenerative changes as well as degenerative meniscus.  Doppler has been negative this point. -Counseled on home exercise therapy and supportive care. -Counseled on compression. -If no improvement can consider injection or imaging.

## 2018-10-28 NOTE — Patient Instructions (Signed)
Nice to meet you  Please try the exercises  Please try ice  Please try the compression  Please try an anti-inflammatory as needed  Please see me back in 3-4 weeks if no better.

## 2019-02-16 DIAGNOSIS — R69 Illness, unspecified: Secondary | ICD-10-CM | POA: Diagnosis not present

## 2019-03-24 ENCOUNTER — Encounter: Payer: Self-pay | Admitting: Family

## 2019-03-24 ENCOUNTER — Ambulatory Visit (INDEPENDENT_AMBULATORY_CARE_PROVIDER_SITE_OTHER): Payer: Medicare HMO | Admitting: Family

## 2019-03-24 DIAGNOSIS — Z20828 Contact with and (suspected) exposure to other viral communicable diseases: Secondary | ICD-10-CM | POA: Diagnosis not present

## 2019-03-24 NOTE — Progress Notes (Signed)
Clifford Gould. is a 73 y.o. male with the following history as recorded in [employer]:  Patient Active Problem List   Diagnosis Date Noted  . Acute pain of right knee 10/28/2018  . Decreased range of motion of right elbow 04/23/2017  . Obesity 04/21/2016  . Routine general medical examination at a health care facility 04/18/2015  . Medicare annual wellness visit, subsequent 04/18/2015  . Herpes simplex 04/16/2014    Current Outpatient Medications  Medication Sig Dispense Refill  . diphenhydramine-acetaminophen (TYLENOL PM EXTRA STRENGTH) 25-500 MG TABS tablet Take 1 tablet by mouth at bedtime as needed.    . IBUPROFEN PO Take by mouth.    Marland Kitchen MAGNESIUM CARBONATE PO Take 1 tablet by mouth.    . meclizine (ANTIVERT) 25 MG tablet Take 1 tablet (25 mg total) by mouth 3 (three) times daily as needed for dizziness. 30 tablet 0  . sildenafil (REVATIO) 20 MG tablet Take 1-5 tablets by mouth daily as needed. 50 tablet 0  . valACYclovir (VALTREX) 1000 MG tablet Take 2 tablets by mouth twice daily for 1 day as needed for cold sores 8 tablet 0   No current facility-administered medications for this visit.     Allergies: Patient has no known allergies.  Past Medical History:  Diagnosis Date  . Chicken pox   . Erectile dysfunction   . Heart murmur   . Herpes simplex   . Hyperlipidemia    last year 2013 elevated now normal    Past Surgical History:  Procedure Laterality Date  . COLONOSCOPY     15+yrs ago- normal per pt.  . SPLENECTOMY      Family History  Problem Relation Age of Onset  . Pancreatic cancer Mother        pancreatic  . Diabetes Father   . Colon cancer Neg Hx     Social History   Tobacco Use  . Smoking status: Never Smoker  . Smokeless tobacco: Never Used  Substance Use Topics  . Alcohol use: Yes    Alcohol/week: 2.0 - 4.0 standard drinks    Types: 2 - 4 Cans of beer per week    Comment: occ beer    Subjective:    I connected with Clifford Gould. on  03/24/19 at  3:40 PM EDT by a video enabled telemedicine application and verified that I am speaking with the correct person using two identifiers.   I discussed the limitations of evaluation and management by telemedicine and the availability of in person appointments. The patient expressed understanding and agreed to proceed. Patient and I are the only 2 people on the video call.   Patient found out today that a family member he was in a car with last week has tested positive for COVID. Neither patient nor the family member were wearing a mask. Patient notes he is not experiencing any symptoms- no fever, cough, shortness of breath or change in taste or smell. He is wondering about testing options.     Objective:  There were no vitals filed for this visit.  General: Well developed, well nourished, in no acute distress  Skin : Warm and dry.  Head: Normocephalic and atraumatic  Lungs: Respirations unlabored;  Neurologic: Alert and oriented; speech intact; face symmetrical;   Assessment:  1. Exposure to SARS-associated coronavirus     Plan:  Patient understands he needs to quarantine at home this weekend and can go for testing on Monday between 8 am and 3:45 pm.  He will need to quarantine until test results are obtained. Strict ER precautions discussed with patient.  No follow-ups on file.  No orders of the defined types were placed in this encounter.   Requested Prescriptions    No prescriptions requested or ordered in this encounter

## 2019-03-25 DIAGNOSIS — Z1159 Encounter for screening for other viral diseases: Secondary | ICD-10-CM | POA: Diagnosis not present

## 2019-03-28 ENCOUNTER — Encounter: Payer: Self-pay | Admitting: Family

## 2019-04-26 ENCOUNTER — Telehealth: Payer: Self-pay | Admitting: Nurse Practitioner

## 2019-04-26 NOTE — Telephone Encounter (Signed)
Copied from [place] [id number]. Topic: Appointment Scheduling - Prior Auth Required for Appointment >> Apr 26, 2019  3:46 PM Richardo Priest, [place] wrote: No appointment has been scheduled. Patient is requesting TOC appointment and CPE. Per scheduling protocol, this appointment requires a prior authorization prior to scheduling. Patient used to be patient of Brien Few, but was unaware she had left practice. Please advise.  Route to department's PEC pool.

## 2019-04-26 NOTE — Telephone Encounter (Signed)
Ok with me 

## 2019-04-27 ENCOUNTER — Encounter: Payer: Self-pay | Admitting: Nurse Practitioner

## 2019-05-02 NOTE — Telephone Encounter (Signed)
Scheduled

## 2019-05-25 ENCOUNTER — Encounter: Payer: Medicare HMO | Admitting: Internal Medicine

## 2019-05-30 ENCOUNTER — Encounter: Payer: Medicare HMO | Admitting: Internal Medicine

## 2019-06-16 ENCOUNTER — Other Ambulatory Visit (INDEPENDENT_AMBULATORY_CARE_PROVIDER_SITE_OTHER): Payer: Medicare HMO

## 2019-06-16 ENCOUNTER — Other Ambulatory Visit: Payer: Self-pay

## 2019-06-16 ENCOUNTER — Ambulatory Visit (INDEPENDENT_AMBULATORY_CARE_PROVIDER_SITE_OTHER): Payer: Medicare HMO | Admitting: Internal Medicine

## 2019-06-16 ENCOUNTER — Encounter: Payer: Self-pay | Admitting: Internal Medicine

## 2019-06-16 VITALS — BP 122/76 | HR 56 | Temp 98.5°F | Ht 65.0 in | Wt 176.0 lb

## 2019-06-16 DIAGNOSIS — E538 Deficiency of other specified B group vitamins: Secondary | ICD-10-CM | POA: Diagnosis not present

## 2019-06-16 DIAGNOSIS — R739 Hyperglycemia, unspecified: Secondary | ICD-10-CM | POA: Insufficient documentation

## 2019-06-16 DIAGNOSIS — E611 Iron deficiency: Secondary | ICD-10-CM | POA: Diagnosis not present

## 2019-06-16 DIAGNOSIS — N183 Chronic kidney disease, stage 3 unspecified: Secondary | ICD-10-CM | POA: Insufficient documentation

## 2019-06-16 DIAGNOSIS — E559 Vitamin D deficiency, unspecified: Secondary | ICD-10-CM

## 2019-06-16 DIAGNOSIS — Z23 Encounter for immunization: Secondary | ICD-10-CM

## 2019-06-16 DIAGNOSIS — Z0000 Encounter for general adult medical examination without abnormal findings: Secondary | ICD-10-CM | POA: Diagnosis not present

## 2019-06-16 DIAGNOSIS — N529 Male erectile dysfunction, unspecified: Secondary | ICD-10-CM | POA: Insufficient documentation

## 2019-06-16 DIAGNOSIS — Z125 Encounter for screening for malignant neoplasm of prostate: Secondary | ICD-10-CM | POA: Diagnosis not present

## 2019-06-16 DIAGNOSIS — E785 Hyperlipidemia, unspecified: Secondary | ICD-10-CM | POA: Insufficient documentation

## 2019-06-16 HISTORY — DX: Hyperlipidemia, unspecified: E78.5

## 2019-06-16 LAB — CBC WITH DIFFERENTIAL/PLATELET
Basophils Absolute: 0.1 10*3/uL (ref 0.0–0.1)
Basophils Relative: 1.2 % (ref 0.0–3.0)
Eosinophils Absolute: 0.2 10*3/uL (ref 0.0–0.7)
Eosinophils Relative: 2.3 % (ref 0.0–5.0)
HCT: 40.8 % (ref 39.0–52.0)
Hemoglobin: 13.7 g/dL (ref 13.0–17.0)
Lymphocytes Relative: 46.6 % — ABNORMAL HIGH (ref 12.0–46.0)
Lymphs Abs: 4.3 10*3/uL — ABNORMAL HIGH (ref 0.7–4.0)
MCHC: 33.6 g/dL (ref 30.0–36.0)
MCV: 96.5 fl (ref 78.0–100.0)
Monocytes Absolute: 0.8 10*3/uL (ref 0.1–1.0)
Monocytes Relative: 8.9 % (ref 3.0–12.0)
Neutro Abs: 3.8 10*3/uL (ref 1.4–7.7)
Neutrophils Relative %: 41 % — ABNORMAL LOW (ref 43.0–77.0)
Platelets: 375 10*3/uL (ref 150.0–400.0)
RBC: 4.23 Mil/uL (ref 4.22–5.81)
RDW: 13.1 % (ref 11.5–15.5)
WBC: 9.2 10*3/uL (ref 4.0–10.5)

## 2019-06-16 LAB — BASIC METABOLIC PANEL
BUN: 23 mg/dL (ref 6–23)
CO2: 28 mEq/L (ref 19–32)
Calcium: 9.3 mg/dL (ref 8.4–10.5)
Chloride: 106 mEq/L (ref 96–112)
Creatinine, Ser: 1.26 mg/dL (ref 0.40–1.50)
GFR: 56.19 mL/min — ABNORMAL LOW (ref >60.00)
Glucose, Bld: 101 mg/dL — ABNORMAL HIGH (ref 70–99)
Potassium: 4.6 mEq/L (ref 3.5–5.1)
Sodium: 140 mEq/L (ref 135–145)

## 2019-06-16 LAB — URINALYSIS, ROUTINE W REFLEX MICROSCOPIC
Bilirubin Urine: NEGATIVE
Hgb urine dipstick: NEGATIVE
Ketones, ur: NEGATIVE
Leukocytes,Ua: NEGATIVE
Nitrite: NEGATIVE
Specific Gravity, Urine: 1.025 (ref 1.000–1.030)
Total Protein, Urine: NEGATIVE
Urine Glucose: NEGATIVE
Urobilinogen, UA: 0.2 (ref 0.0–1.0)
WBC, UA: NONE SEEN — AB (ref 0–?)
pH: 6 (ref 5.0–8.0)

## 2019-06-16 LAB — TSH: TSH: 2.54 u[IU]/mL (ref 0.35–4.50)

## 2019-06-16 LAB — HEMOGLOBIN A1C: Hgb A1c MFr Bld: 5.9 % (ref 4.6–6.5)

## 2019-06-16 LAB — VITAMIN B12: Vitamin B-12: 334 pg/mL (ref 211–911)

## 2019-06-16 LAB — LIPID PANEL
Cholesterol: 199 mg/dL (ref 0–200)
HDL: 37.8 mg/dL — ABNORMAL LOW (ref >39.00)
NonHDL: 161.65
Total CHOL/HDL Ratio: 5
Triglycerides: 211 mg/dL — ABNORMAL HIGH (ref 0.0–149.0)
VLDL: 42.2 mg/dL — ABNORMAL HIGH (ref 0.0–40.0)

## 2019-06-16 LAB — IBC PANEL
Iron: 146 ug/dL (ref 42–165)
Saturation Ratios: 34.9 % (ref 20.0–50.0)
Transferrin: 299 mg/dL (ref 212.0–360.0)

## 2019-06-16 LAB — HEPATIC FUNCTION PANEL
ALT: 15 U/L (ref 0–53)
AST: 21 U/L (ref 0–37)
Albumin: 4.3 g/dL (ref 3.5–5.2)
Alkaline Phosphatase: 68 U/L (ref 39–117)
Bilirubin, Direct: 0.1 mg/dL (ref 0.0–0.3)
Total Bilirubin: 0.6 mg/dL (ref 0.2–1.2)
Total Protein: 6.8 g/dL (ref 6.0–8.3)

## 2019-06-16 LAB — VITAMIN D 25 HYDROXY (VIT D DEFICIENCY, FRACTURES): VITD: 41.11 ng/mL (ref 30.00–100.00)

## 2019-06-16 LAB — PSA: PSA: 2.3 ng/mL (ref 0.10–4.00)

## 2019-06-16 LAB — LDL CHOLESTEROL, DIRECT: Direct LDL: 123 mg/dL

## 2019-06-16 NOTE — Patient Instructions (Signed)

## 2019-06-16 NOTE — Progress Notes (Signed)
Subjective:    Patient ID: Clifford Gould., male    DOB: 04-Apr-1946, 73 y.o.   MRN: [id number]  HPI  Here for wellness and f/u;  Overall doing ok;  Pt denies Chest pain, worsening SOB, DOE, wheezing, orthopnea, PND, worsening LE edema, palpitations, dizziness or syncope.  Pt denies neurological change such as new headache, facial or extremity weakness.  Pt denies polydipsia, polyuria, or low sugar symptoms. Pt states overall good compliance with treatment and medications, good tolerability, and has been trying to follow appropriate diet.  Pt denies worsening depressive symptoms, suicidal ideation or panic. No fever, night sweats, wt loss, loss of appetite, or other constitutional symptoms.  Pt states good ability with ADL's, has low fall risk, home safety reviewed and adequate, no other significant changes in hearing or vision, and only occasionally active with exercise.  No new complaints Past Medical History:  Diagnosis Date  . Chicken pox   . Erectile dysfunction   . Heart murmur   . Herpes simplex   . HLD (hyperlipidemia) 06/16/2019  . Hyperlipidemia    last year 2013 elevated now normal   Past Surgical History:  Procedure Laterality Date  . COLONOSCOPY     15+yrs ago- normal per pt.  . SPLENECTOMY      reports that he has never smoked. He has never used smokeless tobacco. He reports current alcohol use of about 2.0 - 4.0 standard drinks of alcohol per week. He reports that he does not use drugs. family history includes Diabetes in his father; Pancreatic cancer in his mother. No Known Allergies Current Outpatient Medications on File Prior to Visit  Medication Sig Dispense Refill  . diphenhydramine-acetaminophen (TYLENOL PM EXTRA STRENGTH) 25-500 MG TABS tablet Take 1 tablet by mouth at bedtime as needed.    . IBUPROFEN PO Take by mouth.    Marland Kitchen MAGNESIUM CARBONATE PO Take 1 tablet by mouth.    . meclizine (ANTIVERT) 25 MG tablet Take 1 tablet (25 mg total) by mouth 3 (three)  times daily as needed for dizziness. 30 tablet 0  . sildenafil (REVATIO) 20 MG tablet Take 1-5 tablets by mouth daily as needed. 50 tablet 0  . valACYclovir (VALTREX) 1000 MG tablet Take 2 tablets by mouth twice daily for 1 day as needed for cold sores 8 tablet 0   No current facility-administered medications on file prior to visit.    Review of Systems Constitutional: Negative for other unusual diaphoresis, sweats, appetite or weight changes HENT: Negative for other worsening hearing loss, ear pain, facial swelling, mouth sores or neck stiffness.   Eyes: Negative for other worsening pain, redness or other visual disturbance.  Respiratory: Negative for other stridor or swelling Cardiovascular: Negative for other palpitations or other chest pain  Gastrointestinal: Negative for worsening diarrhea or loose stools, blood in stool, distention or other pain Genitourinary: Negative for hematuria, flank pain or other change in urine volume.  Musculoskeletal: Negative for myalgias or other joint swelling.  Skin: Negative for other color change, or other wound or worsening drainage.  Neurological: Negative for other syncope or numbness. Hematological: Negative for other adenopathy or swelling Psychiatric/Behavioral: Negative for hallucinations, other worsening agitation, SI, self-injury, or new decreased concentration All otherwise neg per pt    Objective:   Physical Exam BP 122/76   Pulse (!) 56   Temp 98.5 F (36.9 C) (Oral)   Ht 5\' 5"  (1.651 m)   Wt 176 lb (79.8 kg)   SpO2 95%  BMI 29.29 kg/m  VS noted,  Constitutional: Pt is oriented to person, place, and time. Appears well-developed and well-nourished, in no significant distress and comfortable Head: Normocephalic and atraumatic  Eyes: Conjunctivae and EOM are normal. Pupils are equal, round, and reactive to light Right Ear: External ear normal without discharge Left Ear: External ear normal without discharge Nose: Nose without  discharge or deformity Mouth/Throat: Oropharynx is without other ulcerations and moist  Neck: Normal range of motion. Neck supple. No JVD present. No tracheal deviation present or significant neck LA or mass Cardiovascular: Normal rate, regular rhythm, normal heart sounds and intact distal pulses.   Pulmonary/Chest: WOB normal and breath sounds without rales or wheezing  Abdominal: Soft. Bowel sounds are normal. NT. No HSM  Musculoskeletal: Normal range of motion. Exhibits no edema Lymphadenopathy: Has no other cervical adenopathy.  Neurological: Pt is alert and oriented to person, place, and time. Pt has normal reflexes. No cranial nerve deficit. Motor grossly intact, Gait intact Skin: Skin is warm and dry. No rash noted or new ulcerations Psychiatric:  Has normal mood and affect. Behavior is normal without agitation All otherwise neg per pt Lab Results  Component Value Date   WBC 9.2 06/16/2019   HGB 13.7 06/16/2019   HCT 40.8 06/16/2019   PLT 375.0 06/16/2019   GLUCOSE 101 (H) 06/16/2019   CHOL 199 06/16/2019   TRIG 211.0 (H) 06/16/2019   HDL 37.80 (L) 06/16/2019   LDLDIRECT 123.0 06/16/2019   LDLCALC 90 04/23/2017   ALT 15 06/16/2019   AST 21 06/16/2019   NA 140 06/16/2019   K 4.6 06/16/2019   CL 106 06/16/2019   CREATININE 1.26 06/16/2019   BUN 23 06/16/2019   CO2 28 06/16/2019   TSH 2.54 06/16/2019   PSA 2.30 06/16/2019   INR 0.89 09/25/2009   HGBA1C 5.9 06/16/2019         Assessment & Plan:

## 2019-06-17 ENCOUNTER — Encounter: Payer: Self-pay | Admitting: Internal Medicine

## 2019-06-17 ENCOUNTER — Other Ambulatory Visit: Payer: Self-pay | Admitting: Internal Medicine

## 2019-06-17 MED ORDER — ATORVASTATIN CALCIUM 10 MG PO TABS: 10.0000 mg | ORAL_TABLET | Freq: Every day | ORAL | 3 refills | Status: DC

## 2019-06-17 NOTE — Assessment & Plan Note (Signed)

## 2019-06-17 NOTE — Assessment & Plan Note (Signed)
stable overall by history and exam, recent data reviewed with pt, and pt to continue medical treatment as before,  to f/u any worsening symptoms or concerns  

## 2019-08-04 DIAGNOSIS — D225 Melanocytic nevi of trunk: Secondary | ICD-10-CM | POA: Diagnosis not present

## 2019-08-04 DIAGNOSIS — L821 Other seborrheic keratosis: Secondary | ICD-10-CM | POA: Diagnosis not present

## 2019-08-04 DIAGNOSIS — D1801 Hemangioma of skin and subcutaneous tissue: Secondary | ICD-10-CM | POA: Diagnosis not present

## 2019-08-04 DIAGNOSIS — B078 Other viral warts: Secondary | ICD-10-CM | POA: Diagnosis not present

## 2019-08-04 DIAGNOSIS — L570 Actinic keratosis: Secondary | ICD-10-CM | POA: Diagnosis not present

## 2019-08-29 DIAGNOSIS — R69 Illness, unspecified: Secondary | ICD-10-CM | POA: Diagnosis not present

## 2019-09-12 DIAGNOSIS — R69 Illness, unspecified: Secondary | ICD-10-CM | POA: Diagnosis not present

## 2019-09-14 ENCOUNTER — Encounter: Payer: Self-pay | Admitting: Internal Medicine

## 2019-09-14 DIAGNOSIS — Z809 Family history of malignant neoplasm, unspecified: Secondary | ICD-10-CM

## 2019-09-19 DIAGNOSIS — R69 Illness, unspecified: Secondary | ICD-10-CM | POA: Diagnosis not present

## 2019-10-13 ENCOUNTER — Ambulatory Visit: Payer: Medicare HMO | Attending: Internal Medicine

## 2019-10-13 DIAGNOSIS — Z23 Encounter for immunization: Secondary | ICD-10-CM

## 2019-10-13 NOTE — Progress Notes (Signed)
   [id number]  Name:  Clifford Gould.    MRN: [id number] DOB: 23-May-1946  10/13/2019  [name] was observed post Covid-19 immunization for 15 minutes without incidence. He was provided with Vaccine Information Sheet and instruction to access the V-Safe system.   [name] was instructed to call 911 with any severe reactions post vaccine: Marland Kitchen Difficulty breathing  . Swelling of your face and throat  . A fast heartbeat  . A bad rash all over your body  . Dizziness and weakness    Immunizations Administered    Name Date Dose VIS Date Route   Pfizer COVID-19 Vaccine 10/13/2019  8:45 AM 0.3 mL 07/28/2019 Intramuscular   Manufacturer: [place]   Lot: [id number]   [place]: [id number]

## 2019-10-30 ENCOUNTER — Ambulatory Visit: Payer: Medicare HMO

## 2019-11-02 ENCOUNTER — Ambulatory Visit: Payer: Medicare HMO

## 2019-11-07 ENCOUNTER — Ambulatory Visit: Payer: Medicare HMO | Attending: Internal Medicine

## 2019-11-07 DIAGNOSIS — Z23 Encounter for immunization: Secondary | ICD-10-CM

## 2019-11-07 NOTE — Progress Notes (Signed)
   [id number]  Name:  Clifford Gould.    MRN: [id number] DOB: Mar 02, 1946  11/07/2019  [name] was observed post Covid-19 immunization for 15 minutes without incident. He was provided with Vaccine Information Sheet and instruction to access the V-Safe system.   [name] was instructed to call 911 with any severe reactions post vaccine: Marland Kitchen Difficulty breathing  . Swelling of face and throat  . A fast heartbeat  . A bad rash all over body  . Dizziness and weakness   Immunizations Administered    Name Date Dose VIS Date Route   Pfizer COVID-19 Vaccine 11/07/2019  9:57 AM 0.3 mL 07/28/2019 Intramuscular   Manufacturer: [place]   Lot: [id number]   [place]: [id number]

## 2019-12-11 ENCOUNTER — Telehealth: Payer: Self-pay | Admitting: Internal Medicine

## 2019-12-11 ENCOUNTER — Other Ambulatory Visit: Payer: Self-pay

## 2019-12-11 ENCOUNTER — Ambulatory Visit (INDEPENDENT_AMBULATORY_CARE_PROVIDER_SITE_OTHER): Payer: Medicare HMO

## 2019-12-11 VITALS — BP 138/80 | HR 60 | Temp 98.1°F | Resp 16 | Ht 65.0 in | Wt 175.0 lb

## 2019-12-11 DIAGNOSIS — Z0000 Encounter for general adult medical examination without abnormal findings: Secondary | ICD-10-CM

## 2019-12-11 DIAGNOSIS — M25562 Pain in left knee: Secondary | ICD-10-CM

## 2019-12-11 NOTE — Patient Instructions (Addendum)
Clifford Gould , Thank you for taking time to come for your Medicare Wellness Visit. I appreciate your ongoing commitment to your health goals. Please review the following plan we discussed and let me know if I can assist you in the future.   Screening recommendations/referrals: Colorectal Screening: due 2024  Vision and Dental Exams: Recommended annual ophthalmology exams for early detection of glaucoma and other disorders of the eye Recommended annual dental exams for proper oral hygiene  Vaccinations: Influenza vaccine: 06/16/2019 Pneumococcal vaccine: completed; Pneumovax 12/24/2011 and Prevnar 04/16/2014 Tdap vaccine: declined Covid vaccine: completed; 10/13/2019, 11/07/2019 Shingles vaccine:completed  Advanced directives: Advance directives discussed with you today. Please bring a copy of your POA (Power of [place]) and/or Living Will to your next appointment.  Goals:  Recommend to drink at least 6-8 8oz glasses of water per day.  Recommend to exercise for at least 150 minutes per week.  Recommend to remove any items from the home that may cause slips or trips.  Recommend to decrease portion sizes by eating 3 small healthy meals and at least 2 healthy snacks per day.  Recommend to begin DASH diet as directed below  Recommend to continue efforts to reduce smoking habits until no longer smoking. Smoking Cessation literature is attached below.  Next appointment: Please schedule your Annual Wellness Visit with your Nurse Health Advisor in one year.  Preventive Care 25 Years and Older, Male Preventive care refers to lifestyle choices and visits with your health care provider that can promote health and wellness. What does preventive care include?  A yearly physical exam. This is also called an annual well check.  Dental exams once or twice a year.  Routine eye exams. Ask your health care provider how often you should have your eyes checked.  Personal lifestyle choices,  including:  Daily care of your teeth and gums.  Regular physical activity.  Eating a healthy diet.  Avoiding tobacco and drug use.  Limiting alcohol use.  Practicing safe sex.  Taking low doses of aspirin every day if recommended by your health care provider..  Taking vitamin and mineral supplements as recommended by your health care provider. What happens during an annual well check? The services and screenings done by your health care provider during your annual well check will depend on your age, overall health, lifestyle risk factors, and family history of disease. Counseling  Your health care provider may ask you questions about your:  Alcohol use.  Tobacco use.  Drug use.  Emotional well-being.  Home and relationship well-being.  Sexual activity.  Eating habits.  History of falls.  Memory and ability to understand (cognition).  Work and work Statistician. Screening  You may have the following tests or measurements:  Height, weight, and BMI.  Blood pressure.  Lipid and cholesterol levels. These may be checked every 5 years, or more frequently if you are over 15 years old.  Skin check.  Lung cancer screening. You may have this screening every year starting at age 27 if you have a 30-pack-year history of smoking and currently smoke or have quit within the past 15 years.  Fecal occult blood test (FOBT) of the stool. You may have this test every year starting at age 21.  Flexible sigmoidoscopy or colonoscopy. You may have a sigmoidoscopy every 5 years or a colonoscopy every 10 years starting at age 78.  Prostate cancer screening. Recommendations will vary depending on your family history and other risks.  Hepatitis C blood test.  Hepatitis B  blood test.  Sexually transmitted disease (STD) testing.  Diabetes screening. This is done by checking your blood sugar (glucose) after you have not eaten for a while (fasting). You may have this done every 1-3  years.  Abdominal aortic aneurysm (AAA) screening. You may need this if you are a current or former smoker.  Osteoporosis. You may be screened starting at age 51 if you are at high risk. Talk with your health care provider about your test results, treatment options, and if necessary, the need for more tests. Vaccines  Your health care provider may recommend certain vaccines, such as:  Influenza vaccine. This is recommended every year.  Tetanus, diphtheria, and acellular pertussis (Tdap, Td) vaccine. You may need a Td booster every 10 years.  Zoster vaccine. You may need this after age 31.  Pneumococcal 13-valent conjugate (PCV13) vaccine. One dose is recommended after age 78.  Pneumococcal polysaccharide (PPSV23) vaccine. One dose is recommended after age 24. Talk to your health care provider about which screenings and vaccines you need and how often you need them. This information is not intended to replace advice given to you by your health care provider. Make sure you discuss any questions you have with your health care provider. Document Released: 08/30/2015 Document Revised: 04/22/2016 Document Reviewed: 06/04/2015 Elsevier Interactive Patient Education  2017 [employer] Prevention in the Home Falls can cause injuries. They can happen to people of all ages. There are many things you can do to make your home safe and to help prevent falls. What can I do on the outside of my home?  Regularly fix the edges of walkways and driveways and fix any cracks.  Remove anything that might make you trip as you walk through a door, such as a raised step or threshold.  Trim any bushes or trees on the path to your home.  Use bright outdoor lighting.  Clear any walking paths of anything that might make someone trip, such as rocks or tools.  Regularly check to see if handrails are loose or broken. Make sure that both sides of any steps have handrails.  Any raised decks and porches  should have guardrails on the edges.  Have any leaves, snow, or ice cleared regularly.  Use sand or salt on walking paths during winter.  Clean up any spills in your garage right away. This includes oil or grease spills. What can I do in the bathroom?  Use night lights.  Install grab bars by the toilet and in the tub and shower. Do not use towel bars as grab bars.  Use non-skid mats or decals in the tub or shower.  If you need to sit down in the shower, use a plastic, non-slip stool.  Keep the floor dry. Clean up any water that spills on the floor as soon as it happens.  Remove soap buildup in the tub or shower regularly.  Attach bath mats securely with double-sided non-slip rug tape.  Do not have throw rugs and other things on the floor that can make you trip. What can I do in the bedroom?  Use night lights.  Make sure that you have a light by your bed that is easy to reach.  Do not use any sheets or blankets that are too big for your bed. They should not hang down onto the floor.  Have a firm chair that has side arms. You can use this for support while you get dressed.  Do not have throw  rugs and other things on the floor that can make you trip. What can I do in the kitchen?  Clean up any spills right away.  Avoid walking on wet floors.  Keep items that you use a lot in easy-to-reach places.  If you need to reach something above you, use a strong step stool that has a grab bar.  Keep electrical cords out of the way.  Do not use floor polish or wax that makes floors slippery. If you must use wax, use non-skid floor wax.  Do not have throw rugs and other things on the floor that can make you trip. What can I do with my stairs?  Do not leave any items on the stairs.  Make sure that there are handrails on both sides of the stairs and use them. Fix handrails that are broken or loose. Make sure that handrails are as long as the stairways.  Check any carpeting to  make sure that it is firmly attached to the stairs. Fix any carpet that is loose or worn.  Avoid having throw rugs at the top or bottom of the stairs. If you do have throw rugs, attach them to the floor with carpet tape.  Make sure that you have a light switch at the top of the stairs and the bottom of the stairs. If you do not have them, ask someone to add them for you. What else can I do to help prevent falls?  Wear shoes that:  Do not have high heels.  Have rubber bottoms.  Are comfortable and fit you well.  Are closed at the toe. Do not wear sandals.  If you use a stepladder:  Make sure that it is fully opened. Do not climb a closed stepladder.  Make sure that both sides of the stepladder are locked into place.  Ask someone to hold it for you, if possible.  Clearly mark and make sure that you can see:  Any grab bars or handrails.  First and last steps.  Where the edge of each step is.  Use tools that help you move around (mobility aids) if they are needed. These include:  Canes.  Walkers.  Scooters.  Crutches.  Turn on the lights when you go into a dark area. Replace any light bulbs as soon as they burn out.  Set up your furniture so you have a clear path. Avoid moving your furniture around.  If any of your floors are uneven, fix them.  If there are any pets around you, be aware of where they are.  Review your medicines with your doctor. Some medicines can make you feel dizzy. This can increase your chance of falling. Ask your doctor what other things that you can do to help prevent falls. This information is not intended to replace advice given to you by your health care provider. Make sure you discuss any questions you have with your health care provider. Document Released: 05/30/2009 Document Revised: 01/09/2016 Document Reviewed: 09/07/2014 Elsevier Interactive Patient Education  2017 [employer].

## 2019-12-11 NOTE — Telephone Encounter (Signed)
-----   Message from Clifford Gould, [place] sent at [phone]  8:55 AM EDT ----- Regarding: Left knee pain Dr. Jenny Reichmann,  Patient came in this morning for his AWV and was c/o left knee pain.  He has tried otc analgesic gel and a knee brace. During his visit, he stated that his knee pain varies because it depends on what he is doing.  Patient is very concerned about his knee and would like an xray done or referral to Dr. Hulan Saas.  Or would you prefer that he comes back in the office for an office visit with you?   Mignon Pine, LPN

## 2019-12-11 NOTE — Progress Notes (Signed)
Subjective:   Clifford Rampone. is a 74 y.o. male who presents for Medicare Annual/Subsequent preventive examination.  Review of Systems:  No ROS Medicare Wellness Visit Cardiac Risk Factors include: advanced age (>85men, >34 women);dyslipidemia;male gender Sleep Patterns: No issues with falling sleep; feels rested on waking after 5-6 hours of sleep nightly. Home Safety/Smoke Alarms: Feels safe in home; Smoke alarms in place. Living environment: Lives alone in a 1-story home.  Seat Belt Safety/Bike Helmet: Wears seat belt.    Objective:    Vitals: BP 138/80 (BP Location: Left Arm, Patient Position: Sitting, Cuff Size: Normal)   Pulse 60   Temp 98.1 F (36.7 C)   Resp 16   Ht 5\' 5"  (1.651 m)   Wt 175 lb (79.4 kg)   SpO2 92%   BMI 29.12 kg/m   Body mass index is 29.12 kg/m.  Advanced Directives 12/11/2019 10/24/2018  Does Patient Have a Medical Advance Directive? Yes No  Type of Paramedic of [place];Living will -  Does patient want to make changes to medical advance directive? No - Patient declined -  Copy of [place] in Chart? No - copy requested -  Would patient like information on creating a medical advance directive? - No - Patient declined    Tobacco Social History   Tobacco Use  Smoking Status Never Smoker  Smokeless Tobacco Never Used     Counseling given: No   Clinical Intake:  Pre-visit preparation completed: Yes  Pain : 0-10 Pain Score: 1  Pain Type: Chronic pain Pain Location: Knee Pain Orientation: Left Pain Radiating Towards: N/A Pain Descriptors / Indicators: Aching, Discomfort Pain Onset: More than a month ago Pain Frequency: Occasional Pain Relieving Factors: Has tried otc analgesic gel  Pain Relieving Factors: Has tried otc analgesic gel  BMI - recorded: 29.1 Nutritional Status: BMI 25 -29 Overweight Nutritional Risks: None Diabetes: No  How often do you need to have someone help you  when you read instructions, pamphlets, or other written materials from your doctor or pharmacy?: 1 - Never What is the last grade level you completed in school?: Bachelor's Degree  Interpreter Needed?: No  Information entered by :: Saleen Peden N. Lowell Guitar, LPN  Past Medical History:  Diagnosis Date  . Chicken pox   . Erectile dysfunction   . Heart murmur   . Herpes simplex   . HLD (hyperlipidemia) 06/16/2019  . Hyperlipidemia    last year 2013 elevated now normal   Past Surgical History:  Procedure Laterality Date  . COLONOSCOPY     15+yrs ago- normal per pt.  . SPLENECTOMY     Family History  Problem Relation Age of Onset  . Pancreatic cancer Mother        pancreatic  . Diabetes Father   . Colon cancer Neg Hx    Social History   Socioeconomic History  . Marital status: Widowed    Spouse name: Not on file  . Number of children: 3  . Years of education: 47  . Highest education level: Not on file  Occupational History  . Occupation: Nurse Aide   Tobacco Use  . Smoking status: Never Smoker  . Smokeless tobacco: Never Used  Substance and Sexual Activity  . Alcohol use: Yes    Alcohol/week: 2.0 - 4.0 standard drinks    Types: 2 - 4 Cans of beer per week    Comment: occ beer  . Drug use: No  . Sexual activity:  Never  Other Topics Concern  . Not on file  Social History Narrative   Born and raised in [place], [place]. Currently resides in a house with his wife. 2 dogs (1 boxer, half-lab/pitbull). Fun: Play golf, tennis and read.    Denies religious beliefs that would effect health care.    Social Determinants of Health   Financial Resource Strain:   . Difficulty of Paying Living Expenses:   Food Insecurity:   . Worried About Charity fundraiser in the Last Year:   . Arboriculturist in the Last Year:   Transportation Needs:   . Film/video editor (Medical):   Marland Kitchen Lack of Transportation (Non-Medical):   Physical Activity:   . Days of Exercise per Week:   . Minutes  of Exercise per Session:   Stress:   . Feeling of Stress :   Social Connections:   . Frequency of Communication with Friends and Family:   . Frequency of Social Gatherings with Friends and Family:   . Attends Religious Services:   . Active Member of Clubs or Organizations:   . Attends Archivist Meetings:   Marland Kitchen Marital Status:     Outpatient Encounter Medications as of 12/11/2019  Medication Sig  . atorvastatin (LIPITOR) 10 MG tablet Take 1 tablet (10 mg total) by mouth daily.  . diphenhydramine-acetaminophen (TYLENOL PM EXTRA STRENGTH) 25-500 MG TABS tablet Take 1 tablet by mouth at bedtime as needed.  . IBUPROFEN PO Take by mouth.  Marland Kitchen MAGNESIUM CARBONATE PO Take 1 tablet by mouth.  . meclizine (ANTIVERT) 25 MG tablet Take 1 tablet (25 mg total) by mouth 3 (three) times daily as needed for dizziness.  . sildenafil (REVATIO) 20 MG tablet Take 1-5 tablets by mouth daily as needed.  . valACYclovir (VALTREX) 1000 MG tablet Take 2 tablets by mouth twice daily for 1 day as needed for cold sores   No facility-administered encounter medications on file as of 12/11/2019.    Activities of Daily Living In your present state of health, do you have any difficulty performing the following activities: 12/11/2019  Hearing? N  Vision? N  Difficulty concentrating or making decisions? Y  Walking or climbing stairs? N  Dressing or bathing? N  Doing errands, shopping? N  Preparing Food and eating ? N  Using the Toilet? N  In the past six months, have you accidently leaked urine? N  Do you have problems with loss of bowel control? N  Managing your Medications? N  Managing your Finances? N  Housekeeping or managing your Housekeeping? N  Some recent data might be hidden    Patient Care Team: Biagio Borg, MD as PCP - General (Internal Medicine)   Assessment:   This is a routine wellness examination for Clifford Gould.  Exercise Activities and Dietary recommendations Current Exercise Habits:  The patient does not participate in regular exercise at present(Plays golf, tennis and pickleball), Exercise limited by: orthopedic condition(s)(left knee pain)  Goals    . Client understands the importance of follow-up with providers by attending scheduled visits    . Patient Stated     Continue to eat healthy and exercise and stay healthy.    . Patient Stated     Like to lose 10-15 pounds and to get my left knee better.       Fall Risk Fall Risk  12/11/2019 06/16/2019 10/24/2018 04/25/2018 04/23/2017  Falls in the past year? 0 0 0 No No  Comment - - - - -  Number falls in past yr: 0 - - - -  Injury with Fall? 0 - - - -  Risk for fall due to : No Fall Risks - - - -  Follow up Falls evaluation completed;Education provided - - - -   Is the patient's home free of loose throw rugs in walkways, pet beds, electrical cords, etc?   yes      Grab bars in the bathroom? yes      Handrails on the stairs?   yes      Adequate lighting?   yes  Depression Screen PHQ 2/9 Scores 12/11/2019 06/16/2019 10/24/2018 04/25/2018  PHQ - 2 Score 0 0 0 0  PHQ- 9 Score - - - -          Immunization History  Administered Date(s) Administered  . Fluad Quad(high Dose 65+) 06/16/2019  . Influenza, High Dose Seasonal PF 04/25/2018  . PFIZER SARS-COV-2 Vaccination 10/13/2019, 11/07/2019  . Pneumococcal Conjugate-13 04/16/2014  . Pneumococcal Polysaccharide-23 12/24/2011  . Tetanus 12/24/2011  . Zoster Recombinat (Shingrix) 05/04/2017, 09/22/2017    Qualifies for Shingles Vaccine? completed  Screening Tests Health Maintenance  Topic Date Due  . INFLUENZA VACCINE  03/17/2020  . TETANUS/TDAP  12/23/2021  . COLONOSCOPY  04/26/2023  . COVID-19 Vaccine  Completed  . Hepatitis C Screening  Completed   Cancer Screenings: Lung: Low Dose CT Chest recommended if Age 44-80 years, 30 pack-year currently smoking OR have quit w/in 15years. Patient does not qualify. Colorectal: Yes      Plan:     Reviewed health  maintenance screenings with patient today and relevant education, vaccines, and/or referrals were provided.    Continue doing brain stimulating activities (puzzles, reading, adult coloring books, staying active) to keep memory sharp.    Continue to eat heart healthy diet (full of fruits, vegetables, whole grains, lean protein, water--limit salt, fat, and sugar intake) and increase physical activity as tolerated.  I have personally reviewed and noted the following in the patient's chart:   . Medical and social history . Use of alcohol, tobacco or illicit drugs  . Current medications and supplements . Functional ability and status . Nutritional status . Physical activity . Advanced directives . List of other physicians . Hospitalizations, surgeries, and ER visits in previous 12 months . Vitals . Screenings to include cognitive, depression, and falls . Referrals and appointments  In addition, I have reviewed and discussed with patient certain preventive protocols, quality metrics, and best practice recommendations. A written personalized care plan for preventive services as well as general preventive health recommendations were provided to patient.     Sheral Flow, LPN  [phone] Nurse Health Advisor

## 2019-12-11 NOTE — Telephone Encounter (Signed)
[place] for referral - done

## 2019-12-12 NOTE — Telephone Encounter (Signed)
Please let patient know that Dr. Jenny Reichmann sent in referral to ortho.

## 2019-12-19 ENCOUNTER — Ambulatory Visit: Payer: Self-pay

## 2019-12-19 ENCOUNTER — Encounter: Payer: Self-pay | Admitting: Family Medicine

## 2019-12-19 ENCOUNTER — Ambulatory Visit: Payer: Medicare HMO | Admitting: Family Medicine

## 2019-12-19 ENCOUNTER — Ambulatory Visit (INDEPENDENT_AMBULATORY_CARE_PROVIDER_SITE_OTHER): Payer: Medicare HMO

## 2019-12-19 ENCOUNTER — Other Ambulatory Visit: Payer: Self-pay

## 2019-12-19 VITALS — BP 140/78 | HR 58 | Ht 65.0 in | Wt 174.0 lb

## 2019-12-19 DIAGNOSIS — M25562 Pain in left knee: Secondary | ICD-10-CM

## 2019-12-19 DIAGNOSIS — M1712 Unilateral primary osteoarthritis, left knee: Secondary | ICD-10-CM | POA: Diagnosis not present

## 2019-12-19 DIAGNOSIS — G8929 Other chronic pain: Secondary | ICD-10-CM | POA: Diagnosis not present

## 2019-12-19 DIAGNOSIS — M5416 Radiculopathy, lumbar region: Secondary | ICD-10-CM | POA: Diagnosis not present

## 2019-12-19 NOTE — Patient Instructions (Signed)
Thank you for coming in today. Work on Astronomer.  Let me know if you want me to order PT.  Can do injections in the future.  Get xray today on your way out.

## 2019-12-19 NOTE — Progress Notes (Signed)
Subjective:    CC: L knee pain  I, Kana Thompson, LAT, ATC, am serving as scribe for Dr. Lynne Leader.  HPI: Pt is a 74 y/o male presenting w/ c/o L knee pain.  He locates medial knee pain.  He rates his pain as 6/10 at its worse and describes his pain as sharp at times but mostly sore. Has tried a patellar strap and compression sleeve for the knee. Patient enjoys tennis and pickle ball but has had to stop due to knee pain. Still plays golf.   Radiating pain: Has had it in the past right sided lower back and numbness in the thigh with a lot of standing  L knee swelling: some days he has had left leg swelling that travels down to the ankle L knee mechanical symptoms: "feels like it is going to break off" patient has some concern with stability and has noticed weakness in his legs  Aggravating factors: walking, up and down stairs, walking down hills  Treatments tried: Weight loss which has helped quite a bit.  He notes the right quad numbness and pain has improved significantly with weight loss.  Its not much of a factor now.  Pertinent review of Systems: No fevers or chills  Relevant historical information: CKD   Objective:    Vitals:   12/19/19 0836  BP: 140/78  Pulse: (!) 58  SpO2: 92%   General: Well Developed, well nourished, and in no acute distress.   MSK: Left knee slight bossing medially trace effusion. Range of motion 0-120 degrees with crepitation. Mildly tender palpation medial joint line. Stable ligamentous exam. Intact strength. Negative Murray's test.  Spine normal gait  Lab and Radiology Results  DG Knee AP/LAT W/Sunrise Left  Result Date: 12/19/2019 CLINICAL DATA:  Chronic left knee pain.  No injury. EXAM: LEFT KNEE 3 VIEWS COMPARISON:  None. FINDINGS: No fracture or bone lesion. Mild medial compartment narrowing. Minor medial compartment and patellar marginal osteophytes. No joint effusion. Surrounding soft tissues unremarkable. IMPRESSION: 1. No  fracture or acute finding.  No bone lesion. 2. 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 Electronically Signed   By: Lajean Manes M.D.   On: 12/19/2019 10:49   I, Lynne Leader, personally (independently) visualized and performed the interpretation of the images attached in this note.  Diagnostic Limited MSK Ultrasound of: Left knee Quad tendon intact normal-appearing Trace joint effusion present. Patellar tendon normal-appearing Lateral joint line normal spaced however hypoechoic linear fissure through lateral meniscus indicating tear. Medial joint line narrowed with degenerative free meniscus partially extruded indicating chronic degenerative tear. Small Baker's cyst present posterior medial knee. Minimal pes anserine bursitis present Impression: DJD degenerative meniscus tears small Baker's cyst small pes anserine bursitis present    Impression and Recommendations:    Assessment and Plan: 74 y.o. 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  Symptoms are reasonably manageable at this point.  We discussed pragmatic conservative management strategies.  Plan to try Voltaren gel.  Reconsider compressive knee sleeve during activity and also work on quad strengthening and weight loss.  Offered physical therapy referral and he declined.  He wants to try it on his own first and will accept a referral if needed and will contact me.  Additionally discussed injections.  He does not think his pain is bad enough to consider injections at this time.  Discussed steroid injections ironic acid injections and PRP.  Plan to recheck back as needed for  this issue.  Right thigh numbness and pain occasionally.  Symptoms much improved.  Pain consistent with L3 lumbar radiculopathy.  Watchful waiting at this point..   Orders Placed This Encounter  Procedures  . DG Knee AP/LAT W/Sunrise Left    Standing Status:   Future    Number of Occurrences:   1     Standing Expiration Date:   02/17/2021    Order Specific Question:   Reason for Exam (SYMPTOM  OR DIAGNOSIS REQUIRED)    Answer:   knee pain    Order Specific Question:   Preferred imaging location?    Answer:   [facility]    Order Specific Question:   Radiology Contrast Protocol - do NOT remove file path    Answer:   \\[email or web address]  . [place] LIMITED JOINT SPACE STRUCTURES LOW LEFT(NO LINKED CHARGES)    Order Specific Question:   Reason for Exam (SYMPTOM  OR DIAGNOSIS REQUIRED)    Answer:   knee pain    Order Specific Question:   Preferred imaging location?    Answer:   [facility]  . [place] LIMITED JOINT SPACE STRUCTURES LOW LEFT(NO LINKED CHARGES)    Order Specific Question:   Reason for Exam (SYMPTOM  OR DIAGNOSIS REQUIRED)    Answer:   eval left knee pain    Order Specific Question:   Preferred imaging location?    Answer:   [facility]   No orders of the defined types were placed in this encounter.   Discussed warning signs or symptoms. Please see discharge instructions. Patient expresses understanding.   The above documentation has been reviewed and is accurate and complete Lynne Leader

## 2019-12-29 NOTE — Progress Notes (Signed)
Error

## 2020-03-06 ENCOUNTER — Other Ambulatory Visit: Payer: Self-pay | Admitting: *Deleted

## 2020-03-06 ENCOUNTER — Ambulatory Visit: Payer: Medicare HMO | Attending: Internal Medicine

## 2020-03-06 DIAGNOSIS — Z20822 Contact with and (suspected) exposure to covid-19: Secondary | ICD-10-CM

## 2020-03-07 ENCOUNTER — Telehealth: Payer: Self-pay

## 2020-03-07 LAB — SARS-COV-2, NAA 2 DAY TAT

## 2020-03-07 LAB — NOVEL CORONAVIRUS, NAA: SARS-CoV-2, NAA: DETECTED — AB

## 2020-03-07 NOTE — Telephone Encounter (Signed)
New message    The patient has questions regarding COVID test results asking for a call back

## 2020-03-08 ENCOUNTER — Telehealth: Payer: Self-pay | Admitting: Physician Assistant

## 2020-03-08 ENCOUNTER — Encounter: Payer: Self-pay | Admitting: Physician Assistant

## 2020-03-08 NOTE — Telephone Encounter (Signed)
Called to discuss with patient about Covid symptoms and the use of bamlanivimab/etesevimab or casirivimab/imdevimab, a monoclonal antibody infusion for those with mild to moderate Covid symptoms and at a high risk of hospitalization.  Pt is qualified for this infusion at the [facility] due to Age > 65   Message left to call back our hotline [phone] and sent a mychart message.  Angelena Form PA-C  [facility]

## 2020-03-14 NOTE — Telephone Encounter (Signed)
LVM for pt to call the office back.  °

## 2020-03-15 ENCOUNTER — Telehealth: Payer: Self-pay

## 2020-03-15 NOTE — Telephone Encounter (Signed)
New message   Returning call back to the CMA from today. 

## 2020-04-23 ENCOUNTER — Encounter: Payer: Self-pay | Admitting: Family Medicine

## 2020-04-25 ENCOUNTER — Ambulatory Visit (INDEPENDENT_AMBULATORY_CARE_PROVIDER_SITE_OTHER): Payer: Medicare HMO

## 2020-04-25 ENCOUNTER — Ambulatory Visit: Payer: Self-pay

## 2020-04-25 ENCOUNTER — Ambulatory Visit: Payer: Medicare HMO | Admitting: Family Medicine

## 2020-04-25 ENCOUNTER — Encounter: Payer: Self-pay | Admitting: Family Medicine

## 2020-04-25 ENCOUNTER — Other Ambulatory Visit: Payer: Self-pay

## 2020-04-25 VITALS — BP 150/80 | HR 66 | Ht 65.0 in | Wt 173.0 lb

## 2020-04-25 DIAGNOSIS — M25561 Pain in right knee: Secondary | ICD-10-CM

## 2020-04-25 DIAGNOSIS — M7989 Other specified soft tissue disorders: Secondary | ICD-10-CM | POA: Diagnosis not present

## 2020-04-25 NOTE — Progress Notes (Signed)
Clifford Gould, am serving as a Education administrator for Dr. Lynne Leader.  Clifford Gould. is a 74 y.o. male who presents to [place] [facility] today for R knee and R ankle pain.  He was last seen by Dr. Georgina Snell on 12/19/19 for his L knee and was advised to use Voltaren gel, knee compression sleeve and was shown a HEP by Dr. Georgina Snell focusing on quad strengthening.  Since his last visit, pt reports he is now having  R knee pain, L knee if doing better. Patient states that R knee started swelling in late July and has tightness in the back of his knee that causes pain and thought it may be a baker cyst again. Has trouble going up and down steps and walking on hard surfaces. Patient did notice the pain would radiate down the leg to his R lateral ankle (mostly when driving). States the pain is usually just a nuisance but does have intermittent pain on medial R knee that feels like a stabbing pain, had that pain this morning for about ten minutes. Tried OTC topical and all natural  Medication he heard on the radio was interested in Meloxicam as daughter was prescribed that for her back. Usually  plays pickle ball, tennis and golf and is having a hard time doing any of that. Patient also mentioned being worried about his BP states he does have an appointment with Dr. Jenny Reichmann tomorrow to discuss.    Pertinent review of systems: No fevers or chills  Relevant historical information: CKD   Exam:  BP (!) 150/80 (BP Location: Left Arm, Patient Position: Sitting, Cuff Size: Normal)   Pulse 66   Ht 5\' 5"  (1.651 m)   Wt 173 lb (78.5 kg)   SpO2 99%   BMI 28.79 kg/m  General: Well Developed, well nourished, and in no acute distress.   MSK: Right knee moderate effusion otherwise normal-appearing Normal motion. Tender palpation medial joint line. Stable ligamentous exam. Negative McMurray's test. Intact strength.  Right ankle normal-appearing nontender normal motion stable given his  exam.    Lab and Radiology Results Straight images right knee obtained today personally and independently reviewed. Mild medial fiber DJD.  Mild patellofemoral DJD. No acute fractures.  Diagnostic Limited MSK Ultrasound of: Right knee Quad tendon intact normal-appearing Moderate joint effusion superior patellar space. Patellar tendon normal-appearing Lateral joint line normal-appearing Medial joint line narrowed degenerative with extruded appearing medial meniscus. No Baker's cyst posterior knee Impression: Medial compartment DJD with extruded medial meniscus indicating tear  Procedure: Real-time Ultrasound Guided Injection of right knee lateral superior patellar space Device: Philips Affiniti 50G Images permanently stored and available for review in PACS Verbal informed consent obtained.  Discussed risks and benefits of procedure. Warned about infection bleeding damage to structures skin hypopigmentation and fat atrophy among others. Patient expresses understanding and agreement Time-out conducted.   Noted no overlying erythema, induration, or other signs of local infection.   Skin prepped in a sterile fashion.   Local anesthesia: Topical Ethyl chloride.   With sterile technique and under real time ultrasound guidance:  40 mg of Kenalog and 2 mL of Marcaine injected easily.   Completed without difficulty   Pain immediately resolved suggesting accurate placement of the medication.   Advised to call if fevers/chills, erythema, induration, drainage, or persistent bleeding.   Images permanently stored and available for review in the ultrasound unit.  Impression: Technically successful ultrasound guided injection.  Assessment and Plan: 74 y.o. male with right knee pain exacerbation.  Patient has degenerative changes on physical exam and ultrasound.  Discussed options.  He is already pursuing typical conservative management including compression quad strengthening topical  Voltaren gel and oral NSAIDs.  After discussion we will proceed with steroid injection.  Recheck back as needed.    Orders Placed This Encounter  Procedures  . [place] LIMITED JOINT SPACE STRUCTURES LOW RIGHT(NO LINKED CHARGES)    Standing Status:   Future    Number of Occurrences:   1    Standing Expiration Date:   04/25/2021    Order Specific Question:   Reason for Exam (SYMPTOM  OR DIAGNOSIS REQUIRED)    Answer:   Right knee pain    Order Specific Question:   Preferred imaging location?    Answer:   [place] [place] [facility]  . DG Knee AP/LAT W/Sunrise Right    Standing Status:   Future    Number of Occurrences:   1    Standing Expiration Date:   04/25/2021    Order Specific Question:   Reason for Exam (SYMPTOM  OR DIAGNOSIS REQUIRED)    Answer:   eval knee pain    Order Specific Question:   Preferred imaging location?    Answer:   [facility]    Order Specific Question:   Radiology Contrast Protocol - do NOT remove file path    Answer:   [email or web address]   No orders of the defined types were placed in this encounter.    Discussed warning signs or symptoms. Please see discharge instructions. Patient expresses understanding.   The above documentation has been reviewed and is accurate and complete Lynne Leader, M.D.

## 2020-04-25 NOTE — Patient Instructions (Signed)
Thank you for coming in today.  Plan for xray today.  Use the voltaren gel.  Keep working on quad exercises.   Call or go to the ER if you develop a large red swollen joint with extreme pain or oozing puss.   Recheck as needed.

## 2020-04-26 ENCOUNTER — Other Ambulatory Visit: Payer: Self-pay

## 2020-04-26 ENCOUNTER — Ambulatory Visit (INDEPENDENT_AMBULATORY_CARE_PROVIDER_SITE_OTHER): Payer: Medicare HMO | Admitting: Family

## 2020-04-26 VITALS — BP 165/80 | HR 77 | Temp 98.5°F | Ht 65.0 in | Wt 172.8 lb

## 2020-04-26 DIAGNOSIS — R030 Elevated blood-pressure reading, without diagnosis of hypertension: Secondary | ICD-10-CM | POA: Diagnosis not present

## 2020-04-26 MED ORDER — LOSARTAN POTASSIUM 50 MG PO TABS: 50.0000 mg | ORAL_TABLET | Freq: Every day | ORAL | 0 refills | Status: DC

## 2020-04-26 NOTE — Patient Instructions (Signed)
DASH Eating Plan DASH stands for "Dietary Approaches to Stop Hypertension." The DASH eating plan is a healthy eating plan that has been shown to reduce high blood pressure (hypertension). It may also reduce your risk for type 2 diabetes, heart disease, and stroke. The DASH eating plan may also help with weight loss. What are tips for following this plan?  General guidelines  Avoid eating more than 2,300 mg (milligrams) of salt (sodium) a day. If you have hypertension, you may need to reduce your sodium intake to 1,500 mg a day.  Limit alcohol intake to no more than 1 drink a day for nonpregnant women and 2 drinks a day for men. One drink equals 12 oz of beer, 5 oz of wine, or 1 oz of hard liquor.  Work with your health care provider to maintain a healthy body weight or to lose weight. Ask what an ideal weight is for you.  Get at least 30 minutes of exercise that causes your heart to beat faster (aerobic exercise) most days of the week. Activities may include walking, swimming, or biking.  Work with your health care provider or diet and nutrition specialist (dietitian) to adjust your eating plan to your individual calorie needs. Reading food labels   Check food labels for the amount of sodium per serving. Choose foods with less than 5 percent of the Daily Value of sodium. Generally, foods with less than 300 mg of sodium per serving fit into this eating plan.  To find whole grains, look for the word "whole" as the first word in the ingredient list. Shopping  Buy products labeled as "low-sodium" or "no salt added."  Buy fresh foods. Avoid canned foods and premade or frozen meals. Cooking  Avoid adding salt when cooking. Use salt-free seasonings or herbs instead of table salt or sea salt. Check with your health care provider or pharmacist before using salt substitutes.  Do not fry foods. Cook foods using healthy methods such as baking, boiling, grilling, and broiling instead.  Cook with  heart-healthy oils, such as olive, canola, soybean, or sunflower oil. Meal planning  Eat a balanced diet that includes: ? 5 or more servings of fruits and vegetables each day. At each meal, try to fill half of your plate with fruits and vegetables. ? Up to 6-8 servings of whole grains each day. ? Less than 6 oz of lean meat, poultry, or fish each day. A 3-oz serving of meat is about the same size as a deck of cards. One egg equals 1 oz. ? 2 servings of low-fat dairy each day. ? A serving of nuts, seeds, or beans 5 times each week. ? Heart-healthy fats. Healthy fats called Omega-3 fatty acids are found in foods such as flaxseeds and coldwater fish, like sardines, salmon, and mackerel.  Limit how much you eat of the following: ? Canned or prepackaged foods. ? Food that is high in trans fat, such as fried foods. ? Food that is high in saturated fat, such as fatty meat. ? Sweets, desserts, sugary drinks, and other foods with added sugar. ? Full-fat dairy products.  Do not salt foods before eating.  Try to eat at least 2 vegetarian meals each week.  Eat more home-cooked food and less restaurant, buffet, and fast food.  When eating at a restaurant, ask that your food be prepared with less salt or no salt, if possible. What foods are recommended? The items listed may not be a complete list. Talk with your dietitian about   what dietary choices are best for you. Grains Whole-grain or whole-wheat bread. Whole-grain or whole-wheat pasta. Brown rice. Oatmeal. Quinoa. Bulgur. Whole-grain and low-sodium cereals. Pita bread. Low-fat, low-sodium crackers. Whole-wheat flour tortillas. Vegetables Fresh or frozen vegetables (raw, steamed, roasted, or grilled). Low-sodium or reduced-sodium tomato and vegetable juice. Low-sodium or reduced-sodium tomato sauce and tomato paste. Low-sodium or reduced-sodium canned vegetables. Fruits All fresh, dried, or frozen fruit. Canned fruit in natural juice (without  added sugar). Meat and other protein foods Skinless chicken or turkey. Ground chicken or turkey. Pork with fat trimmed off. Fish and seafood. Egg whites. Dried beans, peas, or lentils. Unsalted nuts, nut butters, and seeds. Unsalted canned beans. Lean cuts of beef with fat trimmed off. Low-sodium, lean deli meat. Dairy Low-fat (1%) or fat-free (skim) milk. Fat-free, low-fat, or reduced-fat cheeses. Nonfat, low-sodium ricotta or cottage cheese. Low-fat or nonfat yogurt. Low-fat, low-sodium cheese. Fats and oils Soft margarine without trans fats. Vegetable oil. Low-fat, reduced-fat, or light mayonnaise and salad dressings (reduced-sodium). Canola, safflower, olive, soybean, and sunflower oils. Avocado. Seasoning and other foods Herbs. Spices. Seasoning mixes without salt. Unsalted popcorn and pretzels. Fat-free sweets. What foods are not recommended? The items listed may not be a complete list. Talk with your dietitian about what dietary choices are best for you. Grains Baked goods made with fat, such as croissants, muffins, or some breads. Dry pasta or rice meal packs. Vegetables Creamed or fried vegetables. Vegetables in a cheese sauce. Regular canned vegetables (not low-sodium or reduced-sodium). Regular canned tomato sauce and paste (not low-sodium or reduced-sodium). Regular tomato and vegetable juice (not low-sodium or reduced-sodium). Pickles. Olives. Fruits Canned fruit in a light or heavy syrup. Fried fruit. Fruit in cream or butter sauce. Meat and other protein foods Fatty cuts of meat. Ribs. Fried meat. Bacon. Sausage. Bologna and other processed lunch meats. Salami. Fatback. Hotdogs. Bratwurst. Salted nuts and seeds. Canned beans with added salt. Canned or smoked fish. Whole eggs or egg yolks. Chicken or turkey with skin. Dairy Whole or 2% milk, cream, and half-and-half. Whole or full-fat cream cheese. Whole-fat or sweetened yogurt. Full-fat cheese. Nondairy creamers. Whipped toppings.  Processed cheese and cheese spreads. Fats and oils Butter. Stick margarine. Lard. Shortening. Ghee. Bacon fat. Tropical oils, such as coconut, palm kernel, or palm oil. Seasoning and other foods Salted popcorn and pretzels. Onion salt, garlic salt, seasoned salt, table salt, and sea salt. Worcestershire sauce. Tartar sauce. Barbecue sauce. Teriyaki sauce. Soy sauce, including reduced-sodium. Steak sauce. Canned and packaged gravies. Fish sauce. Oyster sauce. Cocktail sauce. Horseradish that you find on the shelf. Ketchup. Mustard. Meat flavorings and tenderizers. Bouillon cubes. Hot sauce and Tabasco sauce. Premade or packaged marinades. Premade or packaged taco seasonings. Relishes. Regular salad dressings. Where to find more information:  National Heart, Lung, and Blood Institute: www.nhlbi.nih.gov  American Heart Association: www.heart.org Summary  The DASH eating plan is a healthy eating plan that has been shown to reduce high blood pressure (hypertension). It may also reduce your risk for type 2 diabetes, heart disease, and stroke.  With the DASH eating plan, you should limit salt (sodium) intake to 2,300 mg a day. If you have hypertension, you may need to reduce your sodium intake to 1,500 mg a day.  When on the DASH eating plan, aim to eat more fresh fruits and vegetables, whole grains, lean proteins, low-fat dairy, and heart-healthy fats.  Work with your health care provider or diet and nutrition specialist (dietitian) to adjust your eating plan to your   individual calorie needs. This information is not intended to replace advice given to you by your health care provider. Make sure you discuss any questions you have with your health care provider. Document Revised: 07/16/2017 Document Reviewed: 07/27/2016 Elsevier Patient Education  2020 Elsevier Inc.  

## 2020-04-26 NOTE — Progress Notes (Signed)
X-ray right knee shows no fracture.  No severe or significant arthritis.

## 2020-04-26 NOTE — Progress Notes (Signed)
Clifford Gould. is a 74 y.o. male with the following history as recorded in EpicCare:  Patient Active Problem List   Diagnosis Date Noted  . Primary osteoarthritis of left knee 12/19/2019  . Erectile dysfunction 06/16/2019  . HLD (hyperlipidemia) 06/16/2019  . Hyperglycemia 06/16/2019  . CKD (chronic kidney disease) stage 3, GFR 30-59 ml/min 06/16/2019  . Chronic pain of left knee 10/28/2018  . Decreased range of motion of right elbow 04/23/2017  . Obesity 04/21/2016  . Routine general medical examination at a health care facility 04/18/2015  . Medicare annual wellness visit, subsequent 04/18/2015  . Herpes simplex 04/16/2014    Current Outpatient Medications  Medication Sig Dispense Refill  . atorvastatin (LIPITOR) 10 MG tablet Take 1 tablet (10 mg total) by mouth daily. 90 tablet 3  . diphenhydramine-acetaminophen (TYLENOL PM EXTRA STRENGTH) 25-500 MG TABS tablet Take 1 tablet by mouth at bedtime as needed.    Marland Kitchen MAGNESIUM CARBONATE PO Take 1 tablet by mouth.    . meclizine (ANTIVERT) 25 MG tablet Take 1 tablet (25 mg total) by mouth 3 (three) times daily as needed for dizziness. 30 tablet 0  . sildenafil (REVATIO) 20 MG tablet Take 1-5 tablets by mouth daily as needed. 50 tablet 0  . valACYclovir (VALTREX) 1000 MG tablet Take 2 tablets by mouth twice daily for 1 day as needed for cold sores 8 tablet 0  . IBUPROFEN PO Take by mouth. (Patient not taking: Reported on 04/26/2020)    . losartan (COZAAR) 50 MG tablet Take 1 tablet (50 mg total) by mouth daily. 90 tablet 0   No current facility-administered medications for this visit.    Allergies: Patient has no known allergies.  Past Medical History:  Diagnosis Date  . Chicken pox   . Erectile dysfunction   . Heart murmur   . Herpes simplex   . HLD (hyperlipidemia) 06/16/2019    Past Surgical History:  Procedure Laterality Date  . COLONOSCOPY     15+yrs ago- normal per pt.  . SPLENECTOMY      Family History  Problem  Relation Age of Onset  . Pancreatic cancer Mother        pancreatic  . Diabetes Father   . Colon cancer Neg Hx     Social History   Tobacco Use  . Smoking status: Never Smoker  . Smokeless tobacco: Never Used  Substance Use Topics  . Alcohol use: Yes    Alcohol/week: 2.0 - 4.0 standard drinks    Types: 2 - 4 Cans of beer per week    Comment: occ beer    Subjective:  Presents with concerns for elevated blood pressure in the past week; [facility] and it was elevated; Denies any chest pain, shortness of breath, blurred vision or headache. Notes that his pulse was elevated at 100/ blood pressure was up to 170/90; did have a cortisone shot yesterday;      Objective:  Vitals:   04/26/20 1539  BP: (!) 165/80  Pulse: 77  Temp: 98.5 F (36.9 C)  TempSrc: Oral  SpO2: 98%  Weight: 172 lb 12.8 oz (78.4 kg)  Height: _0  (1.651 m)    General: Well developed, well nourished, in no acute distress  Head: Normocephalic and atraumatic  Lungs: Respirations unlabored; clear to auscultation bilaterally without wheeze, rales, rhonchi  CVS exam: normal rate and regular rhythm.  Neurologic: Alert and  oriented; speech intact; face symmetrical; moves all extremities well; CNII-XII intact without focal deficit   Assessment:  1. Elevated blood pressure reading     Plan:  Update EKG- NSR; check CBC, CMP today; start Losartan 50 mg daily; plan to see his PCP in follow up in 2-3 weeks;  This visit occurred during the SARS-CoV-2 public health emergency.  Safety protocols were in place, including screening questions prior to the visit, additional usage of staff PPE, and extensive cleaning of exam room while observing appropriate contact time as indicated for disinfecting solutions.     Return in about 2 weeks (around 05/10/2020) for with Dr. Jenny Reichmann.  Orders Placed This Encounter  Procedures  . CBC with  Differential/Platelet    Standing Status:   Future    Number of Occurrences:   1    Standing Expiration Date:   04/26/2021  . Comp Met (CMET)    Standing Status:   Future    Number of Occurrences:   1    Standing Expiration Date:   04/26/2021  . EKG 12-Lead    Requested Prescriptions   Signed Prescriptions Disp Refills  . losartan (COZAAR) 50 MG tablet 90 tablet 0    Sig: Take 1 tablet (50 mg total) by mouth daily.

## 2020-04-27 LAB — CBC WITH DIFFERENTIAL/PLATELET
Absolute Monocytes: 1087 cells/uL — ABNORMAL HIGH (ref 200–950)
Basophils Absolute: 21 cells/uL (ref 0–200)
Basophils Relative: 0.1 %
Eosinophils Absolute: 0 cells/uL — ABNORMAL LOW (ref 15–500)
Eosinophils Relative: 0 %
HCT: 35 % — ABNORMAL LOW (ref 38.5–50.0)
Hemoglobin: 12.3 g/dL — ABNORMAL LOW (ref 13.2–17.1)
Lymphs Abs: 2550 cells/uL (ref 850–3900)
MCH: 33.2 pg — ABNORMAL HIGH (ref 27.0–33.0)
MCHC: 35.1 g/dL (ref 32.0–36.0)
MCV: 94.3 fL (ref 80.0–100.0)
MPV: 10.1 fL (ref 7.5–12.5)
Monocytes Relative: 5.2 %
Neutro Abs: 17243 cells/uL — ABNORMAL HIGH (ref 1500–7800)
Neutrophils Relative %: 82.5 %
Platelets: 408 10*3/uL — ABNORMAL HIGH (ref 140–400)
RBC: 3.71 10*6/uL — ABNORMAL LOW (ref 4.20–5.80)
RDW: 12.1 % (ref 11.0–15.0)
Total Lymphocyte: 12.2 %
WBC: 20.9 10*3/uL — ABNORMAL HIGH (ref 3.8–10.8)

## 2020-04-27 LAB — COMPREHENSIVE METABOLIC PANEL
AG Ratio: 1.7 (calc) (ref 1.0–2.5)
ALT: 14 U/L (ref 9–46)
AST: 18 U/L (ref 10–35)
Albumin: 4.5 g/dL (ref 3.6–5.1)
Alkaline phosphatase (APISO): 67 U/L (ref 35–144)
BUN/Creatinine Ratio: 21 (calc) (ref 6–22)
BUN: 28 mg/dL — ABNORMAL HIGH (ref 7–25)
CO2: 25 mmol/L (ref 20–32)
Calcium: 10 mg/dL (ref 8.6–10.3)
Chloride: 107 mmol/L (ref 98–110)
Creat: 1.35 mg/dL — ABNORMAL HIGH (ref 0.70–1.18)
Globulin: 2.6 g/dL (calc) (ref 1.9–3.7)
Glucose, Bld: 107 mg/dL — ABNORMAL HIGH (ref 65–99)
Potassium: 5.3 mmol/L (ref 3.5–5.3)
Sodium: 141 mmol/L (ref 135–146)
Total Bilirubin: 0.5 mg/dL (ref 0.2–1.2)
Total Protein: 7.1 g/dL (ref 6.1–8.1)

## 2020-04-29 ENCOUNTER — Other Ambulatory Visit: Payer: Self-pay | Admitting: Family

## 2020-04-29 DIAGNOSIS — R350 Frequency of micturition: Secondary | ICD-10-CM

## 2020-04-29 DIAGNOSIS — D649 Anemia, unspecified: Secondary | ICD-10-CM

## 2020-04-29 DIAGNOSIS — D72829 Elevated white blood cell count, unspecified: Secondary | ICD-10-CM

## 2020-04-29 NOTE — Telephone Encounter (Signed)
   Patient returned call, made aware of MyChart message. Patient plans to go to lab on 9/14, currently out of town Patient denied having any dental work. He did state he got a Cortisone injection in his knee the same day of labs

## 2020-05-01 ENCOUNTER — Other Ambulatory Visit (INDEPENDENT_AMBULATORY_CARE_PROVIDER_SITE_OTHER): Payer: Medicare HMO

## 2020-05-01 ENCOUNTER — Other Ambulatory Visit: Payer: Self-pay

## 2020-05-01 ENCOUNTER — Ambulatory Visit (INDEPENDENT_AMBULATORY_CARE_PROVIDER_SITE_OTHER)
Admission: RE | Admit: 2020-05-01 | Discharge: 2020-05-01 | Disposition: A | Discharge status: Home / Self Care | Payer: Medicare HMO | Source: Ambulatory Visit | Attending: Family | Admitting: Family

## 2020-05-01 DIAGNOSIS — R350 Frequency of micturition: Secondary | ICD-10-CM | POA: Diagnosis not present

## 2020-05-01 DIAGNOSIS — D72829 Elevated white blood cell count, unspecified: Secondary | ICD-10-CM

## 2020-05-01 DIAGNOSIS — D649 Anemia, unspecified: Secondary | ICD-10-CM

## 2020-05-01 DIAGNOSIS — D72819 Decreased white blood cell count, unspecified: Secondary | ICD-10-CM | POA: Diagnosis not present

## 2020-05-01 LAB — URINALYSIS
Bilirubin Urine: NEGATIVE
Hgb urine dipstick: NEGATIVE
Ketones, ur: NEGATIVE
Leukocytes,Ua: NEGATIVE
Nitrite: NEGATIVE
Specific Gravity, Urine: 1.02 (ref 1.000–1.030)
Total Protein, Urine: NEGATIVE
Urine Glucose: NEGATIVE
Urobilinogen, UA: 0.2 (ref 0.0–1.0)
pH: 5.5 (ref 5.0–8.0)

## 2020-05-01 LAB — CBC WITH DIFFERENTIAL/PLATELET
Basophils Absolute: 0.1 10*3/uL (ref 0.0–0.1)
Basophils Relative: 0.8 % (ref 0.0–3.0)
Eosinophils Absolute: 0.3 10*3/uL (ref 0.0–0.7)
Eosinophils Relative: 2.8 % (ref 0.0–5.0)
HCT: 36.9 % — ABNORMAL LOW (ref 39.0–52.0)
Hemoglobin: 12.7 g/dL — ABNORMAL LOW (ref 13.0–17.0)
Lymphocytes Relative: 52.1 % — ABNORMAL HIGH (ref 12.0–46.0)
Lymphs Abs: 5.9 10*3/uL — ABNORMAL HIGH (ref 0.7–4.0)
MCHC: 34.3 g/dL (ref 30.0–36.0)
MCV: 95.8 fl (ref 78.0–100.0)
Monocytes Absolute: 0.9 10*3/uL (ref 0.1–1.0)
Monocytes Relative: 8.1 % (ref 3.0–12.0)
Neutro Abs: 4.1 10*3/uL (ref 1.4–7.7)
Neutrophils Relative %: 36.2 % — ABNORMAL LOW (ref 43.0–77.0)
Platelets: 406 10*3/uL — ABNORMAL HIGH (ref 150.0–400.0)
RBC: 3.85 Mil/uL — ABNORMAL LOW (ref 4.22–5.81)
RDW: 13.1 % (ref 11.5–15.5)
WBC: 11.4 10*3/uL — ABNORMAL HIGH (ref 4.0–10.5)

## 2020-05-01 LAB — PSA: PSA: 1.87 ng/mL (ref 0.10–4.00)

## 2020-05-01 LAB — FERRITIN: Ferritin: 203.7 ng/mL (ref 22.0–322.0)

## 2020-05-01 NOTE — Addendum Note (Signed)
Addended by: Trenda Moots on: 3/67/2550 07:41 AM   Modules accepted: Orders

## 2020-05-02 LAB — URINE CULTURE
MICRO NUMBER:: 10953631
Result:: NO GROWTH
SPECIMEN QUALITY:: ADEQUATE

## 2020-05-17 ENCOUNTER — Encounter: Payer: Self-pay | Admitting: Internal Medicine

## 2020-05-17 ENCOUNTER — Ambulatory Visit (INDEPENDENT_AMBULATORY_CARE_PROVIDER_SITE_OTHER): Payer: Medicare HMO | Admitting: Internal Medicine

## 2020-05-17 ENCOUNTER — Other Ambulatory Visit: Payer: Self-pay

## 2020-05-17 VITALS — BP 122/70 | HR 59 | Temp 98.3°F | Ht 65.0 in | Wt 172.0 lb

## 2020-05-17 DIAGNOSIS — M1711 Unilateral primary osteoarthritis, right knee: Secondary | ICD-10-CM | POA: Diagnosis not present

## 2020-05-17 DIAGNOSIS — R739 Hyperglycemia, unspecified: Secondary | ICD-10-CM

## 2020-05-17 DIAGNOSIS — I10 Essential (primary) hypertension: Secondary | ICD-10-CM | POA: Insufficient documentation

## 2020-05-17 DIAGNOSIS — E785 Hyperlipidemia, unspecified: Secondary | ICD-10-CM | POA: Diagnosis not present

## 2020-05-17 DIAGNOSIS — N183 Chronic kidney disease, stage 3 unspecified: Secondary | ICD-10-CM

## 2020-05-17 NOTE — Patient Instructions (Addendum)
Ok to hold the losartan for now, and check BP for 10 days, and restart for BP average more than 140/90  Please continue all other medications as before, and refills have been done if requested.  Please have the pharmacy call with any other refills you may need.  Please continue your efforts at being more active, low cholesterol diet, and weight control.  Please keep your appointments with your specialists as you may have planned  Please make an Appointment to return in Jun 19, 2020, with the labs done at the [facility] lab several days before

## 2020-05-17 NOTE — Progress Notes (Signed)
Subjective:    Patient ID: Clifford Clos., male    DOB: 1946/08/15, 74 y.o.   MRN: [id number]  HPI   Here to f/u recent elevated BP now on losartan 50 qd, elevated glucose and leukocytosis all noted post right knee cortisone shot for which right knee pain much improved.  Pt denies chest pain, increased sob or doe, wheezing, orthopnea, PND, increased LE swelling, palpitations, dizziness or syncope.  Pt denies new neurological symptoms such as new headache, or facial or extremity weakness or numbness   Pt denies polydipsia, polyuria, Pt wondering if really needs to be taking the losartan   Left knee was painful prior to the right knee, and it also continues to do well. BP Readings from Last 3 Encounters:  05/17/20 122/70  04/26/20 (!) 165/80  04/25/20 (!) 150/80   Wt Readings from Last 3 Encounters:  05/17/20 172 lb (78 kg)  04/26/20 172 lb 12.8 oz (78.4 kg)  04/25/20 173 lb (78.5 kg)  S/p covid infection with URI symptoms only about 3 mo ago.   Past Medical History:  Diagnosis Date   Chicken pox    Erectile dysfunction    Heart murmur    Herpes simplex    HLD (hyperlipidemia) 06/16/2019   Past Surgical History:  Procedure Laterality Date   COLONOSCOPY     15+yrs ago- normal per pt.   SPLENECTOMY      reports that he has never smoked. He has never used smokeless tobacco. He reports current alcohol use of about 2.0 - 4.0 standard drinks of alcohol per week. He reports that he does not use drugs. family history includes Diabetes in his father; Pancreatic cancer in his mother. No Known Allergies Current Outpatient Medications on File Prior to Visit  Medication Sig Dispense Refill   atorvastatin (LIPITOR) 10 MG tablet Take 1 tablet (10 mg total) by mouth daily. 90 tablet 3   diphenhydramine-acetaminophen (TYLENOL PM EXTRA STRENGTH) 25-500 MG TABS tablet Take 1 tablet by mouth at bedtime as needed.     losartan (COZAAR) 50 MG tablet Take 1 tablet (50 mg total) by mouth  daily. 90 tablet 0   MAGNESIUM CARBONATE PO Take 1 tablet by mouth.     meclizine (ANTIVERT) 25 MG tablet Take 1 tablet (25 mg total) by mouth 3 (three) times daily as needed for dizziness. 30 tablet 0   sildenafil (REVATIO) 20 MG tablet Take 1-5 tablets by mouth daily as needed. 50 tablet 0   valACYclovir (VALTREX) 1000 MG tablet Take 2 tablets by mouth twice daily for 1 day as needed for cold sores 8 tablet 0   No current facility-administered medications on file prior to visit.   Review of Systems All otherwise neg per pt    Objective:   Physical Exam BP 122/70 (BP Location: Left Arm, Patient Position: Sitting, Cuff Size: Large)    Pulse (!) 59    Temp 98.3 F (36.8 C) (Oral)    Ht 5\' 5"  (1.651 m)    Wt 172 lb (78 kg)    SpO2 92%    BMI 28.62 kg/m  VS noted,  Constitutional: Pt appears in NAD HENT: Head: NCAT.  Right Ear: External ear normal.  Left Ear: External ear normal.  Eyes: . Pupils are equal, round, and reactive to light. Conjunctivae and EOM are normal Nose: without d/c or deformity Neck: Neck supple. Gross normal ROM Cardiovascular: Normal rate and regular rhythm.   Pulmonary/Chest: Effort normal and breath sounds without  rales or wheezing.  Abd:  Soft, NT, ND, + BS, no organomegaly Neurological: Pt is alert. At baseline orientation, motor grossly intact Skin: Skin is warm. No rashes, other new lesions, no LE edema Psychiatric: Pt behavior is normal without agitation  All otherwise neg per pt  Lab Results  Component Value Date   WBC 11.4 (H) 05/01/2020   HGB 12.7 (L) 05/01/2020   HCT 36.9 (L) 05/01/2020   PLT 406.0 (H) 05/01/2020   GLUCOSE 107 (H) 04/26/2020   CHOL 199 06/16/2019   TRIG 211.0 (H) 06/16/2019   HDL 37.80 (L) 06/16/2019   LDLDIRECT 123.0 06/16/2019   LDLCALC 90 04/23/2017   ALT 14 04/26/2020   AST 18 04/26/2020   NA 141 04/26/2020   K 5.3 04/26/2020   CL 107 04/26/2020   CREATININE 1.35 (H) 04/26/2020   BUN 28 (H) 04/26/2020   CO2 25  04/26/2020   TSH 2.54 06/16/2019   PSA 1.87 05/01/2020   INR 0.89 09/25/2009   HGBA1C 5.9 06/16/2019      Assessment & Plan:

## 2020-05-18 ENCOUNTER — Encounter: Payer: Self-pay | Admitting: Internal Medicine

## 2020-05-18 DIAGNOSIS — M1711 Unilateral primary osteoarthritis, right knee: Secondary | ICD-10-CM | POA: Insufficient documentation

## 2020-05-18 NOTE — Assessment & Plan Note (Signed)
stable overall by history and exam, recent data reviewed with pt, and pt to continue medical treatment as before,  to f/u any worsening symptoms or concerns  

## 2020-05-18 NOTE — Assessment & Plan Note (Addendum)
Recent elevation possibly reative, Ok for off losartan, check bp for 10 days and call with results, goal < 14090 or less  I spent 31 minutes in preparing to see the patient by review of recent labs, imaging and procedures, obtaining and reviewing separately obtained history, communicating with the patient and family or caregiver, ordering medications, tests or procedures, and documenting clinical information in the EHR including the differential Dx, treatment, and any further evaluation and other management of htn, hld, ckd, hyperglycemia, right knee arthritis

## 2020-05-18 NOTE — Assessment & Plan Note (Signed)
Fortunately much improved, cont to follow

## 2020-05-22 ENCOUNTER — Encounter: Payer: Self-pay | Admitting: Internal Medicine

## 2020-05-22 MED ORDER — VALACYCLOVIR HCL 1 G PO TABS: ORAL_TABLET | ORAL | 0 refills | Status: DC

## 2020-05-22 MED ORDER — LOSARTAN POTASSIUM 50 MG PO TABS: 50.0000 mg | ORAL_TABLET | Freq: Every day | ORAL | 3 refills | Status: DC

## 2020-05-23 MED ORDER — VALACYCLOVIR HCL 1 G PO TABS: ORAL_TABLET | ORAL | 0 refills | Status: DC

## 2020-05-23 MED ORDER — LOSARTAN POTASSIUM 50 MG PO TABS: 50.0000 mg | ORAL_TABLET | Freq: Every day | ORAL | 3 refills | Status: DC

## 2020-05-23 NOTE — Addendum Note (Signed)
Addended by: Biagio Borg on: 05/23/2020 05:46 PM   Modules accepted: Orders

## 2020-06-13 ENCOUNTER — Other Ambulatory Visit (INDEPENDENT_AMBULATORY_CARE_PROVIDER_SITE_OTHER): Payer: Medicare HMO

## 2020-06-13 DIAGNOSIS — R739 Hyperglycemia, unspecified: Secondary | ICD-10-CM | POA: Diagnosis not present

## 2020-06-13 DIAGNOSIS — Z0000 Encounter for general adult medical examination without abnormal findings: Secondary | ICD-10-CM | POA: Diagnosis not present

## 2020-06-13 DIAGNOSIS — Z125 Encounter for screening for malignant neoplasm of prostate: Secondary | ICD-10-CM | POA: Diagnosis not present

## 2020-06-13 LAB — LIPID PANEL
Cholesterol: 161 mg/dL (ref 0–200)
HDL: 35.1 mg/dL — ABNORMAL LOW (ref >39.00)
NonHDL: 126.38
Total CHOL/HDL Ratio: 5
Triglycerides: 278 mg/dL — ABNORMAL HIGH (ref 0.0–149.0)
VLDL: 55.6 mg/dL — ABNORMAL HIGH (ref 0.0–40.0)

## 2020-06-13 LAB — BASIC METABOLIC PANEL
BUN: 17 mg/dL (ref 6–23)
CO2: 22 mEq/L (ref 19–32)
Calcium: 9.7 mg/dL (ref 8.4–10.5)
Chloride: 106 mEq/L (ref 96–112)
Creatinine, Ser: 1.33 mg/dL (ref 0.40–1.50)
GFR: 53.06 mL/min — ABNORMAL LOW (ref >60.00)
Glucose, Bld: 85 mg/dL (ref 70–99)
Potassium: 4.7 mEq/L (ref 3.5–5.1)
Sodium: 138 mEq/L (ref 135–145)

## 2020-06-13 LAB — CBC WITH DIFFERENTIAL/PLATELET
Basophils Absolute: 0.1 10*3/uL (ref 0.0–0.1)
Basophils Relative: 1 % (ref 0.0–3.0)
Eosinophils Absolute: 0.2 10*3/uL (ref 0.0–0.7)
Eosinophils Relative: 1.5 % (ref 0.0–5.0)
HCT: 37.7 % — ABNORMAL LOW (ref 39.0–52.0)
Hemoglobin: 12.9 g/dL — ABNORMAL LOW (ref 13.0–17.0)
Lymphocytes Relative: 18.9 % (ref 12.0–46.0)
Lymphs Abs: 2.6 10*3/uL (ref 0.7–4.0)
MCHC: 34.3 g/dL (ref 30.0–36.0)
MCV: 94.9 fl (ref 78.0–100.0)
Monocytes Absolute: 1.1 10*3/uL — ABNORMAL HIGH (ref 0.1–1.0)
Monocytes Relative: 7.9 % (ref 3.0–12.0)
Neutro Abs: 9.7 10*3/uL — ABNORMAL HIGH (ref 1.4–7.7)
Neutrophils Relative %: 70.7 % (ref 43.0–77.0)
Platelets: 370 10*3/uL (ref 150.0–400.0)
RBC: 3.97 Mil/uL — ABNORMAL LOW (ref 4.22–5.81)
RDW: 13.2 % (ref 11.5–15.5)
WBC: 13.7 10*3/uL — ABNORMAL HIGH (ref 4.0–10.5)

## 2020-06-13 LAB — PSA: PSA: 1.86 ng/mL (ref 0.10–4.00)

## 2020-06-13 LAB — HEPATIC FUNCTION PANEL
ALT: 14 U/L (ref 0–53)
AST: 18 U/L (ref 0–37)
Albumin: 4.6 g/dL (ref 3.5–5.2)
Alkaline Phosphatase: 88 U/L (ref 39–117)
Bilirubin, Direct: 0.1 mg/dL (ref 0.0–0.3)
Total Bilirubin: 0.5 mg/dL (ref 0.2–1.2)
Total Protein: 7.1 g/dL (ref 6.0–8.3)

## 2020-06-13 LAB — TSH: TSH: 2.53 u[IU]/mL (ref 0.35–4.50)

## 2020-06-13 LAB — HEMOGLOBIN A1C: Hgb A1c MFr Bld: 6.1 % (ref 4.6–6.5)

## 2020-06-13 LAB — LDL CHOLESTEROL, DIRECT: Direct LDL: 79 mg/dL

## 2020-06-14 ENCOUNTER — Other Ambulatory Visit (INDEPENDENT_AMBULATORY_CARE_PROVIDER_SITE_OTHER): Payer: Medicare HMO

## 2020-06-14 ENCOUNTER — Encounter: Payer: Self-pay | Admitting: Internal Medicine

## 2020-06-14 DIAGNOSIS — Z0000 Encounter for general adult medical examination without abnormal findings: Secondary | ICD-10-CM | POA: Diagnosis not present

## 2020-06-14 LAB — URINALYSIS, ROUTINE W REFLEX MICROSCOPIC
Bilirubin Urine: NEGATIVE
Hgb urine dipstick: NEGATIVE
Ketones, ur: NEGATIVE
Leukocytes,Ua: NEGATIVE
Nitrite: NEGATIVE
RBC / HPF: NONE SEEN (ref 0–?)
Specific Gravity, Urine: 1.02 (ref 1.000–1.030)
Total Protein, Urine: NEGATIVE
Urine Glucose: NEGATIVE
Urobilinogen, UA: 0.2 (ref 0.0–1.0)
pH: 5.5 (ref 5.0–8.0)

## 2020-06-19 ENCOUNTER — Other Ambulatory Visit: Payer: Self-pay | Admitting: Internal Medicine

## 2020-06-19 ENCOUNTER — Other Ambulatory Visit: Payer: Self-pay

## 2020-06-19 ENCOUNTER — Ambulatory Visit (INDEPENDENT_AMBULATORY_CARE_PROVIDER_SITE_OTHER): Payer: Medicare HMO | Admitting: Internal Medicine

## 2020-06-19 VITALS — BP 146/80 | HR 64 | Temp 98.4°F | Ht 65.0 in | Wt 177.0 lb

## 2020-06-19 DIAGNOSIS — R739 Hyperglycemia, unspecified: Secondary | ICD-10-CM

## 2020-06-19 DIAGNOSIS — N1831 Chronic kidney disease, stage 3a: Secondary | ICD-10-CM

## 2020-06-19 DIAGNOSIS — Z0000 Encounter for general adult medical examination without abnormal findings: Secondary | ICD-10-CM | POA: Diagnosis not present

## 2020-06-19 DIAGNOSIS — I10 Essential (primary) hypertension: Secondary | ICD-10-CM

## 2020-06-19 DIAGNOSIS — E7849 Other hyperlipidemia: Secondary | ICD-10-CM

## 2020-06-19 MED ORDER — ATORVASTATIN CALCIUM 10 MG PO TABS
10.0000 mg | ORAL_TABLET | Freq: Every day | ORAL | 3 refills | Status: DC
Start: 2020-06-19 — End: 2021-06-16

## 2020-06-19 NOTE — Patient Instructions (Signed)
Please continue to monitor your BP at home, with the goal being to be at least less than 14090 on average, and less than 130/80 would be even better  Please continue all other medications as before, and refills have been done if requested.  Please have the pharmacy call with any other refills you may need.  Please continue your efforts at being more active, low cholesterol diet, and weight control.  You are otherwise up to date with prevention measures today.  Please keep your appointments with your specialists as you may have planned  Please see the [facility] lab in 6 months for the kidney testing  Please make an Appointment to return for your 1 year visit, or sooner if needed

## 2020-06-19 NOTE — Telephone Encounter (Signed)
Please refill as per office routine med refill policy (all routine meds refilled for 3 mo or monthly per pt preference up to one year from last visit, then month to month grace period for 3 mo, then further med refills will have to be denied)  

## 2020-06-19 NOTE — Progress Notes (Signed)
Subjective:    Patient ID: Clifford Clos., male    DOB: 10-12-1945, 74 y.o.   MRN: [id number]  HPI   Here for wellness and f/u;  Overall doing ok;  Pt denies Chest pain, worsening SOB, DOE, wheezing, orthopnea, PND, worsening LE edema, palpitations, dizziness or syncope.  Pt denies neurological change such as new headache, facial or extremity weakness.  Pt denies polydipsia, polyuria, or low sugar symptoms. Pt states overall good compliance with treatment and medications, good tolerability, and has been trying to follow appropriate diet.  Pt denies worsening depressive symptoms, suicidal ideation or panic. No fever, night sweats, wt loss, loss of appetite, or other constitutional symptoms.  Pt states good ability with ADL's, has low fall risk, home safety reviewed and adequate, no other significant changes in hearing or vision, and only occasionally active with exercise.  Bp more like 130/80 at home.  Had flu shot oct 1  Bilateral knee pain improved with cortisone.  No new complaints Past Medical History:  Diagnosis Date  . Chicken pox   . Erectile dysfunction   . Heart murmur   . Herpes simplex   . HLD (hyperlipidemia) 06/16/2019   Past Surgical History:  Procedure Laterality Date  . COLONOSCOPY     15+yrs ago- normal per pt.  . SPLENECTOMY      reports that he has never smoked. He has never used smokeless tobacco. He reports current alcohol use of about 2.0 - 4.0 standard drinks of alcohol per week. He reports that he does not use drugs. family history includes Diabetes in his father; Pancreatic cancer in his mother. No Known Allergies Current Outpatient Medications on File Prior to Visit  Medication Sig Dispense Refill  . amoxicillin (AMOXIL) 500 MG capsule Take 500 mg by mouth 3 (three) times daily.    . diphenhydramine-acetaminophen (TYLENOL PM EXTRA STRENGTH) 25-500 MG TABS tablet Take 1 tablet by mouth at bedtime as needed.    Marland Kitchen losartan (COZAAR) 50 MG tablet Take 1 tablet  (50 mg total) by mouth daily. 90 tablet 3  . MAGNESIUM CARBONATE PO Take 1 tablet by mouth.    . meclizine (ANTIVERT) 25 MG tablet Take 1 tablet (25 mg total) by mouth 3 (three) times daily as needed for dizziness. 30 tablet 0  . sildenafil (REVATIO) 20 MG tablet Take 1-5 tablets by mouth daily as needed. 50 tablet 0  . valACYclovir (VALTREX) 1000 MG tablet Take 2 tablets by mouth twice daily for 1 day as needed for cold sores 30 tablet 0   No current facility-administered medications on file prior to visit.   Review of Systems All otherwise neg per pt    Objective:   Physical Exam BP (!) 146/80 (BP Location: Left Arm, Patient Position: Sitting, Cuff Size: Large)   Pulse 64   Temp 98.4 F (36.9 C) (Oral)   Ht 5\' 5"  (1.651 m)   Wt 177 lb (80.3 kg)   SpO2 95%   BMI 29.45 kg/m  VS noted,  Constitutional: Pt appears in NAD HENT: Head: NCAT.  Right Ear: External ear normal.  Left Ear: External ear normal.  Eyes: . Pupils are equal, round, and reactive to light. Conjunctivae and EOM are normal Nose: without d/c or deformity Neck: Neck supple. Gross normal ROM Cardiovascular: Normal rate and regular rhythm.   Pulmonary/Chest: Effort normal and breath sounds without rales or wheezing.  Abd:  Soft, NT, ND, + BS, no organomegaly Neurological: Pt is alert. At baseline orientation,  motor grossly intact Skin: Skin is warm. No rashes, other new lesions, no LE edema Psychiatric: Pt behavior is normal without agitation  All otherwise neg per pt Lab Results  Component Value Date   WBC 13.7 (H) 06/13/2020   HGB 12.9 (L) 06/13/2020   HCT 37.7 (L) 06/13/2020   PLT 370.0 06/13/2020   GLUCOSE 85 06/13/2020   CHOL 161 06/13/2020   TRIG 278.0 (H) 06/13/2020   HDL 35.10 (L) 06/13/2020   LDLDIRECT 79.0 06/13/2020   LDLCALC 90 04/23/2017   ALT 14 06/13/2020   AST 18 06/13/2020   NA 138 06/13/2020   K 4.7 06/13/2020   CL 106 06/13/2020   CREATININE 1.33 06/13/2020   BUN 17 06/13/2020    CO2 22 06/13/2020   TSH 2.53 06/13/2020   PSA 1.86 06/13/2020   INR 0.89 09/25/2009   HGBA1C 6.1 06/13/2020      Assessment & Plan:

## 2020-06-24 ENCOUNTER — Encounter: Payer: Self-pay | Admitting: Internal Medicine

## 2020-06-24 NOTE — Assessment & Plan Note (Signed)

## 2020-06-24 NOTE — Assessment & Plan Note (Signed)
Pt states BP < 140/90 at home,  to f/u any worsening symptoms or concerns

## 2020-06-24 NOTE — Assessment & Plan Note (Signed)
stable overall by history and exam, recent data reviewed with pt, and pt to continue medical treatment as before,  to f/u any worsening symptoms or concerns  

## 2020-07-17 DIAGNOSIS — H5213 Myopia, bilateral: Secondary | ICD-10-CM | POA: Diagnosis not present

## 2020-07-19 IMAGING — DX DG KNEE AP/LAT W/ SUNRISE*L*
3 series · 4 of 4 positions shown · non-contrast
Comparison: None.

CLINICAL DATA: Chronic left knee pain.  No injury.

EXAM:
LEFT KNEE 3 VIEWS

[Series 1: knee ap · 2 of 2 slices shown]
[im 1/2]
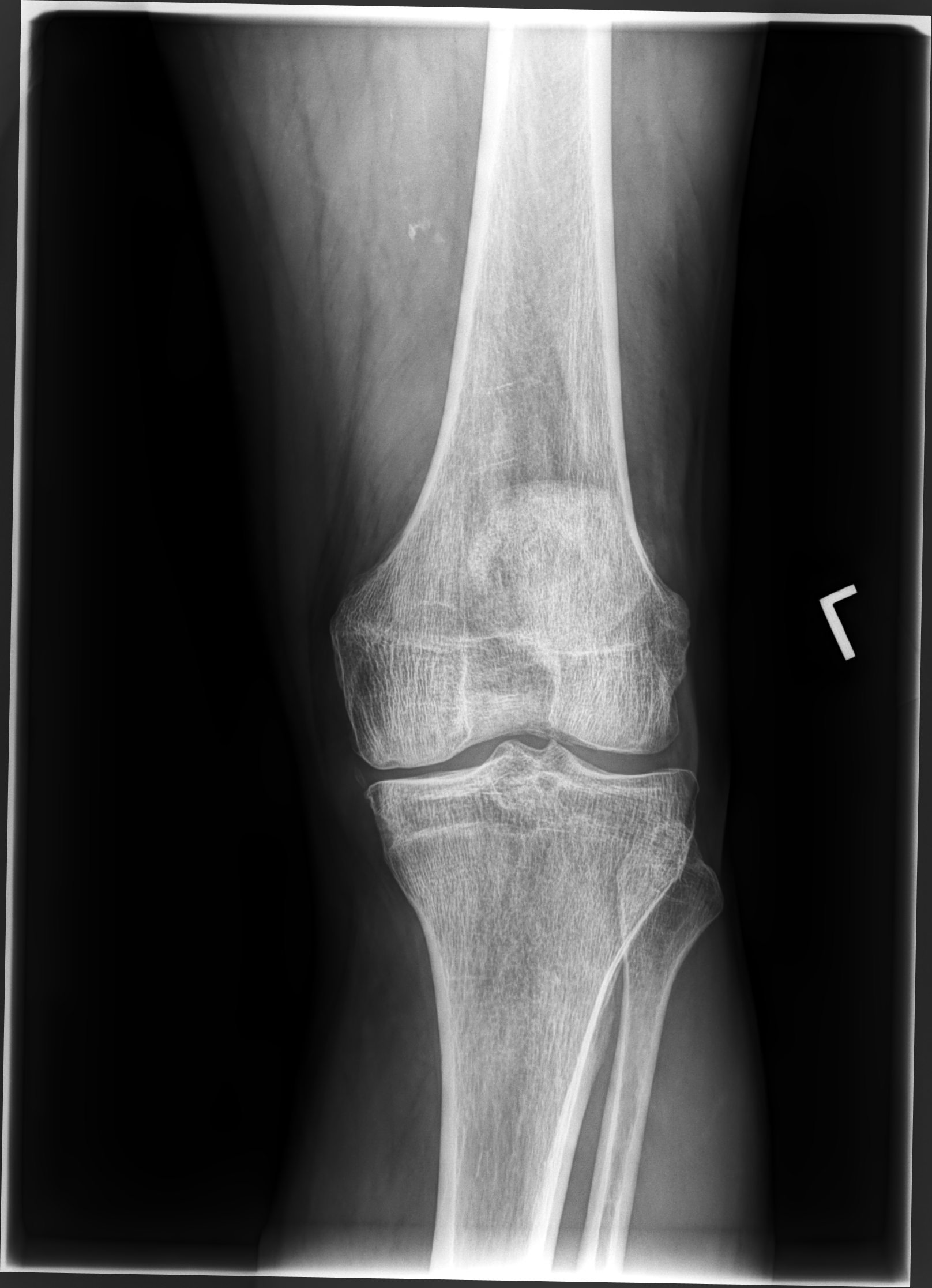
[im 2/2]
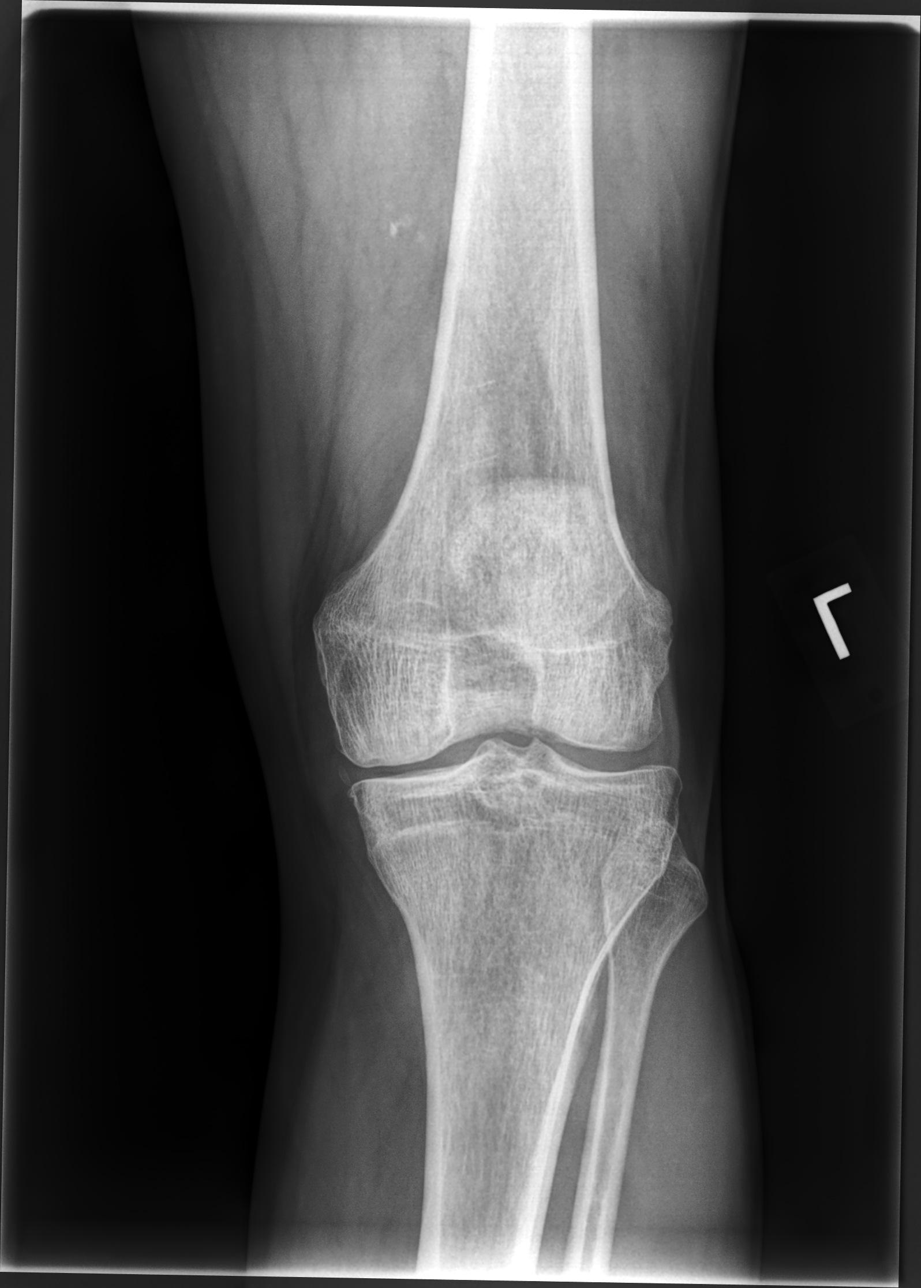

[knee lat]
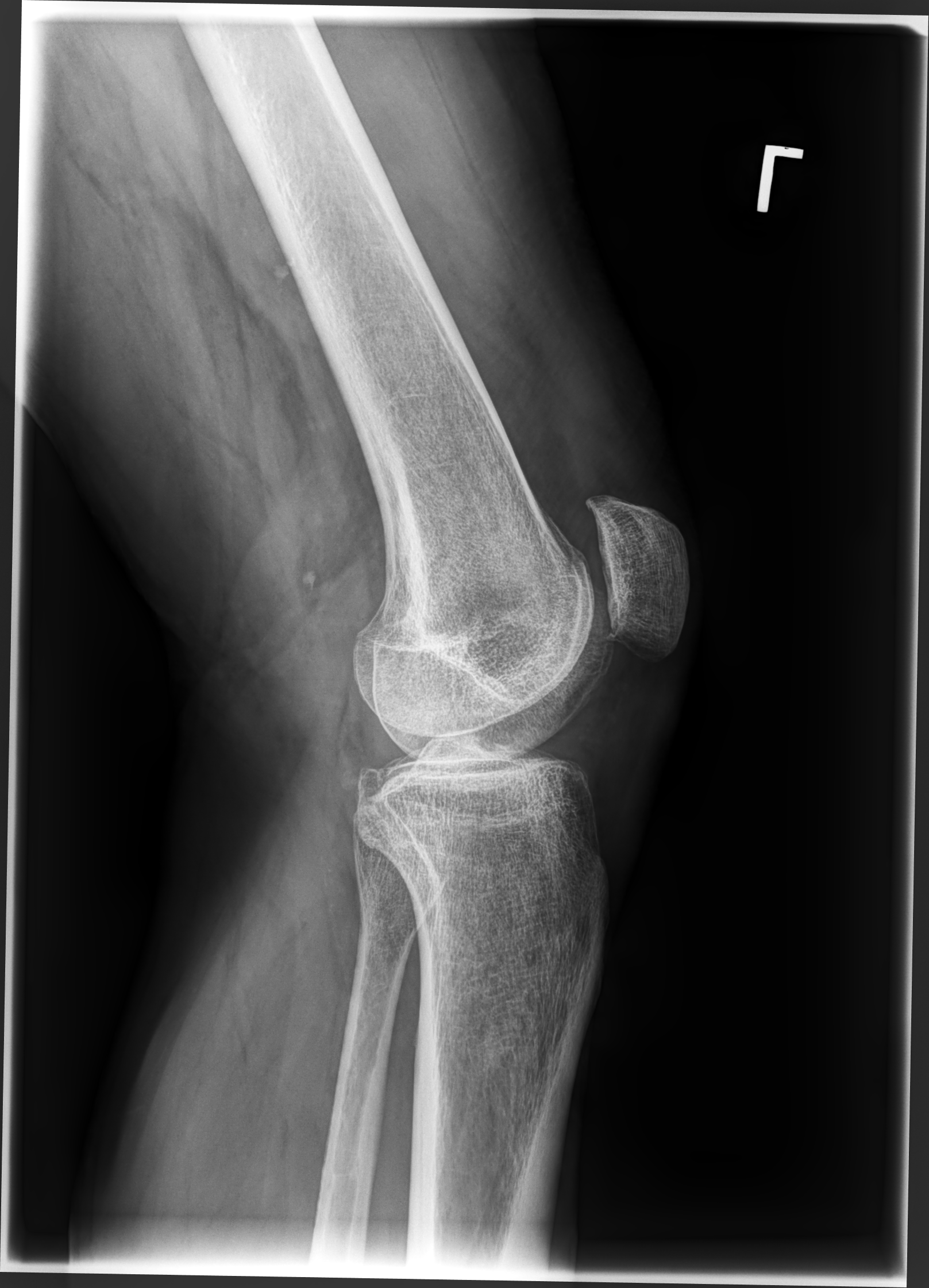

[patella (sunrise) tan]
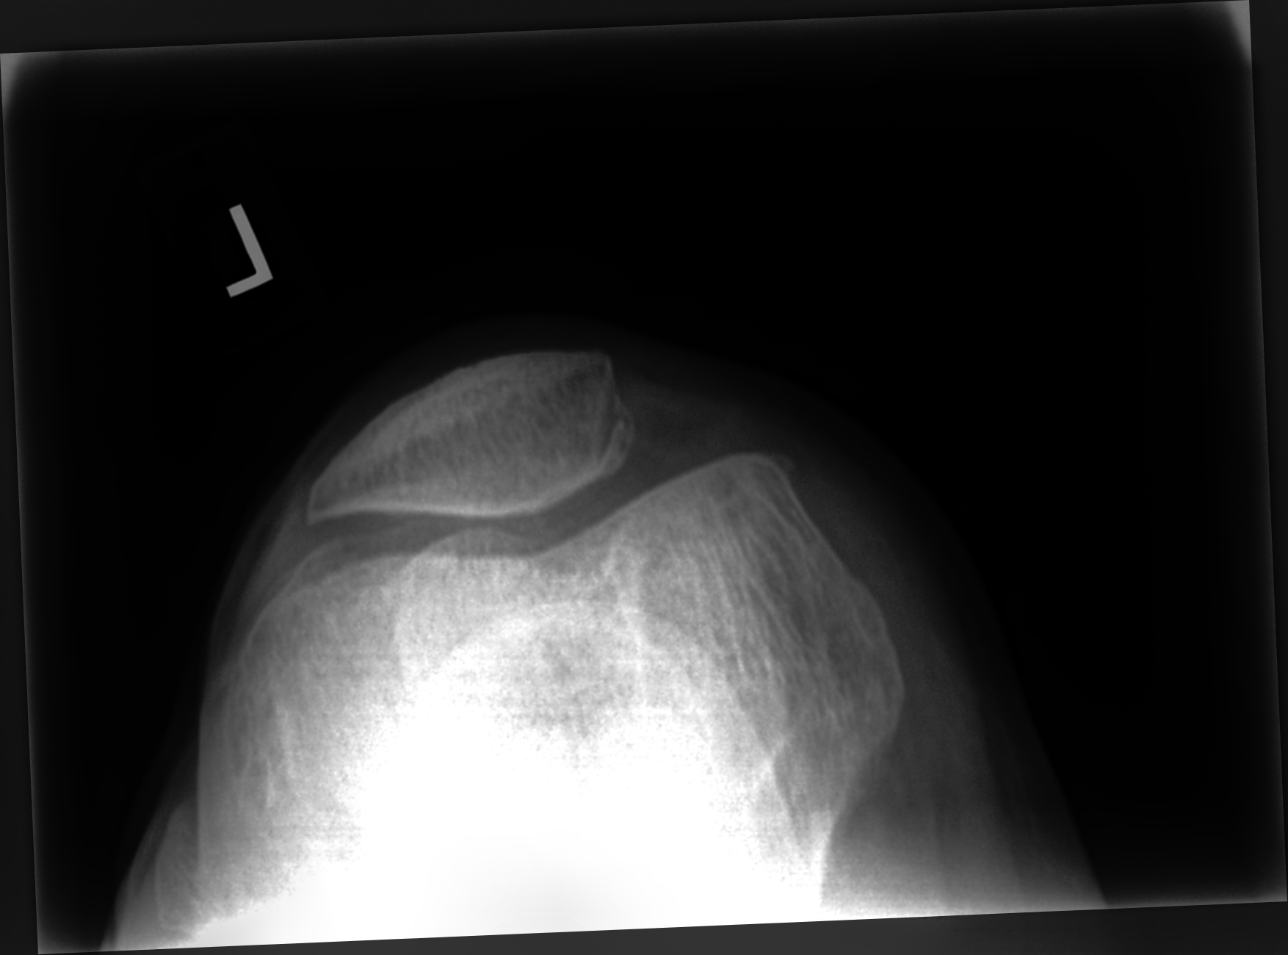

[4 of 4 positions shown; findings below may reference images not displayed]

FINDINGS: No fracture or bone lesion.

Mild medial compartment narrowing. Minor medial compartment and
patellar marginal osteophytes. No joint effusion.

Surrounding soft tissues unremarkable.
IMPRESSION: 1. No fracture or acute finding.  No bone lesion.
2. Mild osteoarthritis predominantly affecting the medial
compartment.

## 2020-08-15 DIAGNOSIS — Z0100 Encounter for examination of eyes and vision without abnormal findings: Secondary | ICD-10-CM | POA: Diagnosis not present

## 2020-08-23 DIAGNOSIS — D485 Neoplasm of uncertain behavior of skin: Secondary | ICD-10-CM | POA: Diagnosis not present

## 2020-08-23 DIAGNOSIS — D225 Melanocytic nevi of trunk: Secondary | ICD-10-CM | POA: Diagnosis not present

## 2020-08-23 DIAGNOSIS — L821 Other seborrheic keratosis: Secondary | ICD-10-CM | POA: Diagnosis not present

## 2020-08-23 DIAGNOSIS — L820 Inflamed seborrheic keratosis: Secondary | ICD-10-CM | POA: Diagnosis not present

## 2020-09-30 ENCOUNTER — Encounter: Payer: Self-pay | Admitting: Internal Medicine

## 2020-09-30 DIAGNOSIS — N1831 Chronic kidney disease, stage 3a: Secondary | ICD-10-CM

## 2020-11-24 IMAGING — DX DG KNEE AP/LAT W/ SUNRISE*R*
3 series · 3 of 3 positions shown · non-contrast
Comparison: X-ray right tibia fibula 01/29/2014

CLINICAL DATA: Chronic intermittent swelling and medial pain. No
known injury.

EXAM:
RIGHT KNEE 3 VIEWS

[knee ap]
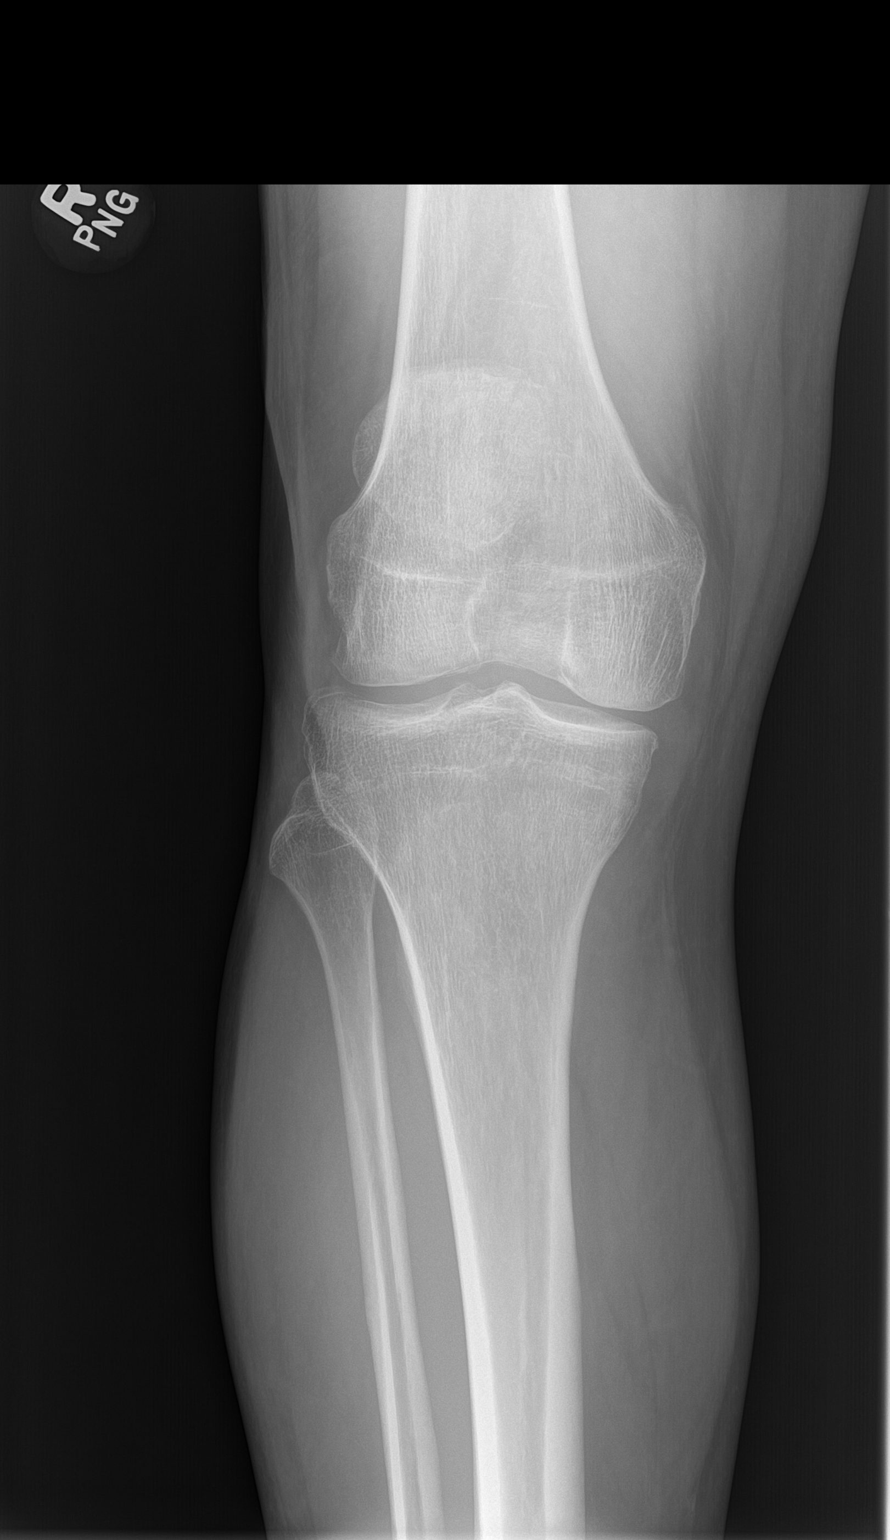

[knee lat]
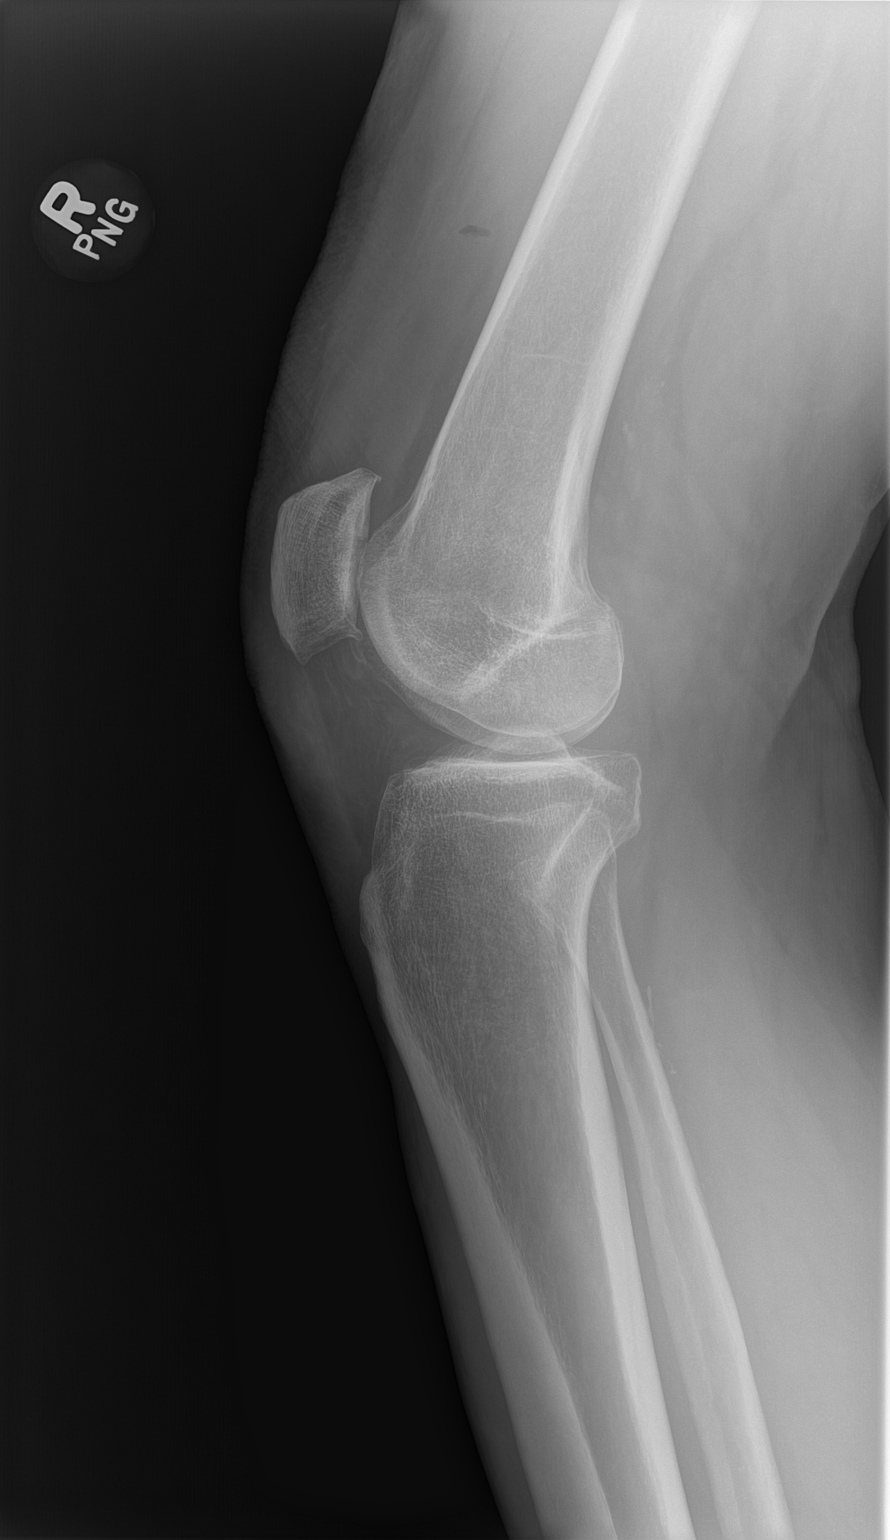

[patella]
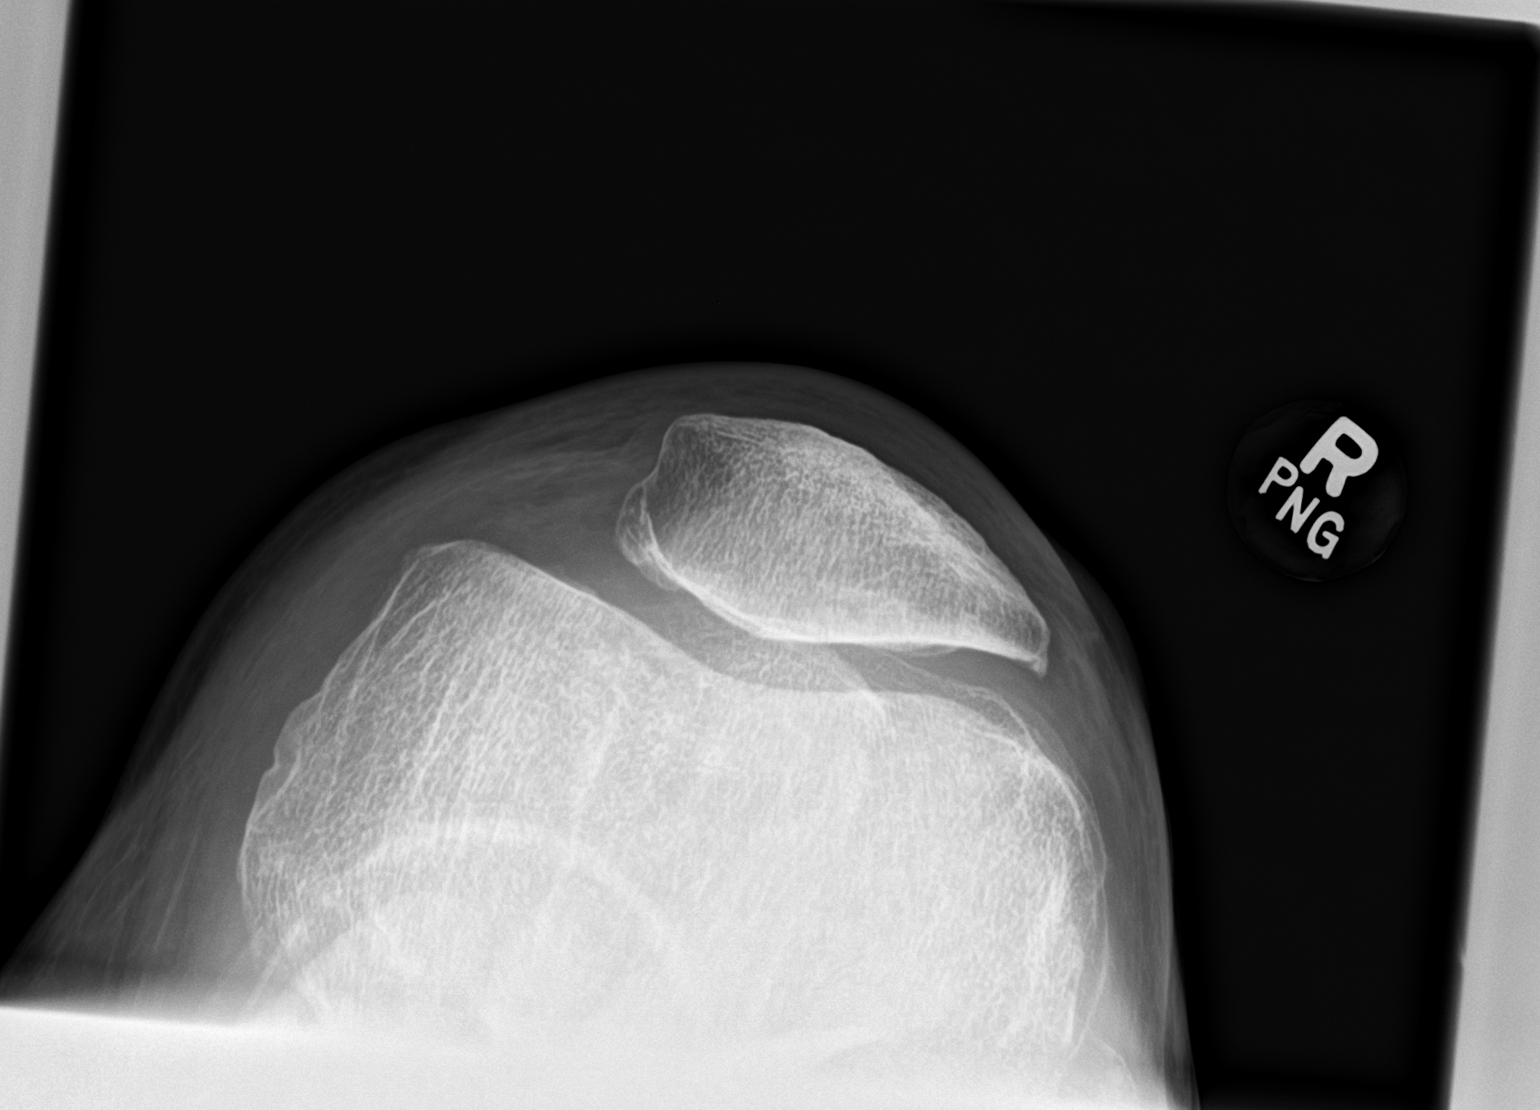

[3 of 3 positions shown; findings below may reference images not displayed]

FINDINGS: No evidence of fracture, dislocation, or joint effusion. No evidence
of arthropathy or other focal bone abnormality. Soft tissues are
unremarkable.
IMPRESSION: Negative.

## 2020-12-13 ENCOUNTER — Encounter: Payer: Self-pay | Admitting: Internal Medicine

## 2020-12-16 ENCOUNTER — Encounter: Payer: Self-pay | Admitting: Internal Medicine

## 2020-12-16 ENCOUNTER — Other Ambulatory Visit (INDEPENDENT_AMBULATORY_CARE_PROVIDER_SITE_OTHER): Payer: Medicare HMO

## 2020-12-16 DIAGNOSIS — R739 Hyperglycemia, unspecified: Secondary | ICD-10-CM | POA: Diagnosis not present

## 2020-12-16 DIAGNOSIS — N1831 Chronic kidney disease, stage 3a: Secondary | ICD-10-CM

## 2020-12-16 LAB — HEPATIC FUNCTION PANEL
ALT: 15 U/L (ref 0–53)
AST: 22 U/L (ref 0–37)
Albumin: 4.4 g/dL (ref 3.5–5.2)
Alkaline Phosphatase: 59 U/L (ref 39–117)
Bilirubin, Direct: 0.1 mg/dL (ref 0.0–0.3)
Total Bilirubin: 0.4 mg/dL (ref 0.2–1.2)
Total Protein: 7.1 g/dL (ref 6.0–8.3)

## 2020-12-16 LAB — BASIC METABOLIC PANEL
BUN: 23 mg/dL (ref 6–23)
CO2: 27 mEq/L (ref 19–32)
Calcium: 9.4 mg/dL (ref 8.4–10.5)
Chloride: 104 mEq/L (ref 96–112)
Creatinine, Ser: 1.35 mg/dL (ref 0.40–1.50)
GFR: 51.93 mL/min — ABNORMAL LOW (ref >60.00)
Glucose, Bld: 97 mg/dL (ref 70–99)
Potassium: 4.5 mEq/L (ref 3.5–5.1)
Sodium: 139 mEq/L (ref 135–145)

## 2020-12-16 LAB — MICROALBUMIN / CREATININE URINE RATIO
Creatinine,U: 70.4 mg/dL
Microalb Creat Ratio: 1 mg/g (ref 0.0–30.0)
Microalb, Ur: <0.7 mg/dL (ref 0.0–1.9)

## 2020-12-16 LAB — LIPID PANEL
Cholesterol: 126 mg/dL (ref 0–200)
HDL: 34.9 mg/dL — ABNORMAL LOW (ref >39.00)
LDL Cholesterol: 56 mg/dL (ref 0–99)
NonHDL: 91.13
Total CHOL/HDL Ratio: 4
Triglycerides: 177 mg/dL — ABNORMAL HIGH (ref 0.0–149.0)
VLDL: 35.4 mg/dL (ref 0.0–40.0)

## 2020-12-16 LAB — HEMOGLOBIN A1C: Hgb A1c MFr Bld: 6.1 % (ref 4.6–6.5)

## 2020-12-16 LAB — VITAMIN D 25 HYDROXY (VIT D DEFICIENCY, FRACTURES): VITD: 43.45 ng/mL (ref 30.00–100.00)

## 2020-12-16 LAB — PHOSPHORUS: Phosphorus: 2.7 mg/dL (ref 2.3–4.6)

## 2020-12-31 ENCOUNTER — Telehealth: Payer: Self-pay | Admitting: Internal Medicine

## 2020-12-31 NOTE — Telephone Encounter (Signed)
LVM for pt to rtn my call to schedule AWV with [facility]. Please schedule this appt if pt calls the office.  °

## 2021-01-07 ENCOUNTER — Other Ambulatory Visit: Payer: Self-pay

## 2021-01-07 ENCOUNTER — Ambulatory Visit (INDEPENDENT_AMBULATORY_CARE_PROVIDER_SITE_OTHER): Payer: Medicare HMO

## 2021-01-07 VITALS — BP 132/70 | HR 73 | Temp 98.2°F | Ht 65.0 in | Wt 170.4 lb

## 2021-01-07 DIAGNOSIS — Z0000 Encounter for general adult medical examination without abnormal findings: Secondary | ICD-10-CM | POA: Diagnosis not present

## 2021-01-07 NOTE — Patient Instructions (Signed)
Clifford Gould , Thank you for taking time to come for your Medicare Wellness Visit. I appreciate your ongoing commitment to your health goals. Please review the following plan we discussed and let me know if I can assist you in the future.   Screening recommendations/referrals: Colonoscopy: 04/25/2013; due every 10 years Recommended yearly ophthalmology/optometry visit for glaucoma screening and checkup Recommended yearly dental visit for hygiene and checkup  Vaccinations: Influenza vaccine: 06/2020 Pneumococcal vaccine: 12/24/2011, 04/16/2014 Tdap vaccine: 12/24/2011; due every 10 years Shingles vaccine: 05/04/2017, 09/22/2017   Covid-19: 10/13/2019, 11/07/2019, 07/26/2020  Advanced directives: Please bring a copy of your health care power of attorney and living will to the office at your convenience.  Conditions/risks identified: Yes; Reviewed health maintenance screenings with patient today and relevant education, vaccines, and/or referrals were provided. Please continue to do your personal lifestyle choices by: daily care of teeth and gums, regular physical activity (goal should be 5 days a week for 30 minutes), eat a healthy diet, avoid tobacco and drug use, limiting any alcohol intake, taking a low-dose aspirin (if not allergic or have been advised by your provider otherwise) and taking vitamins and minerals as recommended by your provider. Continue doing brain stimulating activities (puzzles, reading, adult coloring books, staying active) to keep memory sharp. Continue to eat heart healthy diet (full of fruits, vegetables, whole grains, lean protein, water--limit salt, fat, and sugar intake) and increase physical activity as tolerated.  Next appointment: Please schedule your next Medicare Wellness Visit with your Nurse Health Advisor in 1 year by calling [phone].  Preventive Care 35 Years and Older, Male Preventive care refers to lifestyle choices and visits with your health care provider that can  promote health and wellness. What does preventive care include?  A yearly physical exam. This is also called an annual well check.  Dental exams once or twice a year.  Routine eye exams. Ask your health care provider how often you should have your eyes checked.  Personal lifestyle choices, including:  Daily care of your teeth and gums.  Regular physical activity.  Eating a healthy diet.  Avoiding tobacco and drug use.  Limiting alcohol use.  Practicing safe sex.  Taking low doses of aspirin every day.  Taking vitamin and mineral supplements as recommended by your health care provider. What happens during an annual well check? The services and screenings done by your health care provider during your annual well check will depend on your age, overall health, lifestyle risk factors, and family history of disease. Counseling  Your health care provider may ask you questions about your:  Alcohol use.  Tobacco use.  Drug use.  Emotional well-being.  Home and relationship well-being.  Sexual activity.  Eating habits.  History of falls.  Memory and ability to understand (cognition).  Work and work Statistician. Screening  You may have the following tests or measurements:  Height, weight, and BMI.  Blood pressure.  Lipid and cholesterol levels. These may be checked every 5 years, or more frequently if you are over 47 years old.  Skin check.  Lung cancer screening. You may have this screening every year starting at age 64 if you have a 30-pack-year history of smoking and currently smoke or have quit within the past 15 years.  Fecal occult blood test (FOBT) of the stool. You may have this test every year starting at age 52.  Flexible sigmoidoscopy or colonoscopy. You may have a sigmoidoscopy every 5 years or a colonoscopy every 10 years starting  at age 45.  Prostate cancer screening. Recommendations will vary depending on your family history and other  risks.  Hepatitis C blood test.  Hepatitis B blood test.  Sexually transmitted disease (STD) testing.  Diabetes screening. This is done by checking your blood sugar (glucose) after you have not eaten for a while (fasting). You may have this done every 1-3 years.  Abdominal aortic aneurysm (AAA) screening. You may need this if you are a current or former smoker.  Osteoporosis. You may be screened starting at age 24 if you are at high risk. Talk with your health care provider about your test results, treatment options, and if necessary, the need for more tests. Vaccines  Your health care provider may recommend certain vaccines, such as:  Influenza vaccine. This is recommended every year.  Tetanus, diphtheria, and acellular pertussis (Tdap, Td) vaccine. You may need a Td booster every 10 years.  Zoster vaccine. You may need this after age 2.  Pneumococcal 13-valent conjugate (PCV13) vaccine. One dose is recommended after age 28.  Pneumococcal polysaccharide (PPSV23) vaccine. One dose is recommended after age 41. Talk to your health care provider about which screenings and vaccines you need and how often you need them. This information is not intended to replace advice given to you by your health care provider. Make sure you discuss any questions you have with your health care provider. Document Released: 08/30/2015 Document Revised: 04/22/2016 Document Reviewed: 06/04/2015 Elsevier Interactive Patient Education  2017 [employer] Prevention in the Home Falls can cause injuries. They can happen to people of all ages. There are many things you can do to make your home safe and to help prevent falls. What can I do on the outside of my home?  Regularly fix the edges of walkways and driveways and fix any cracks.  Remove anything that might make you trip as you walk through a door, such as a raised step or threshold.  Trim any bushes or trees on the path to your home.  Use  bright outdoor lighting.  Clear any walking paths of anything that might make someone trip, such as rocks or tools.  Regularly check to see if handrails are loose or broken. Make sure that both sides of any steps have handrails.  Any raised decks and porches should have guardrails on the edges.  Have any leaves, snow, or ice cleared regularly.  Use sand or salt on walking paths during winter.  Clean up any spills in your garage right away. This includes oil or grease spills. What can I do in the bathroom?  Use night lights.  Install grab bars by the toilet and in the tub and shower. Do not use towel bars as grab bars.  Use non-skid mats or decals in the tub or shower.  If you need to sit down in the shower, use a plastic, non-slip stool.  Keep the floor dry. Clean up any water that spills on the floor as soon as it happens.  Remove soap buildup in the tub or shower regularly.  Attach bath mats securely with double-sided non-slip rug tape.  Do not have throw rugs and other things on the floor that can make you trip. What can I do in the bedroom?  Use night lights.  Make sure that you have a light by your bed that is easy to reach.  Do not use any sheets or blankets that are too big for your bed. They should not hang down onto  the floor.  Have a firm chair that has side arms. You can use this for support while you get dressed.  Do not have throw rugs and other things on the floor that can make you trip. What can I do in the kitchen?  Clean up any spills right away.  Avoid walking on wet floors.  Keep items that you use a lot in easy-to-reach places.  If you need to reach something above you, use a strong step stool that has a grab bar.  Keep electrical cords out of the way.  Do not use floor polish or wax that makes floors slippery. If you must use wax, use non-skid floor wax.  Do not have throw rugs and other things on the floor that can make you trip. What can  I do with my stairs?  Do not leave any items on the stairs.  Make sure that there are handrails on both sides of the stairs and use them. Fix handrails that are broken or loose. Make sure that handrails are as long as the stairways.  Check any carpeting to make sure that it is firmly attached to the stairs. Fix any carpet that is loose or worn.  Avoid having throw rugs at the top or bottom of the stairs. If you do have throw rugs, attach them to the floor with carpet tape.  Make sure that you have a light switch at the top of the stairs and the bottom of the stairs. If you do not have them, ask someone to add them for you. What else can I do to help prevent falls?  Wear shoes that:  Do not have high heels.  Have rubber bottoms.  Are comfortable and fit you well.  Are closed at the toe. Do not wear sandals.  If you use a stepladder:  Make sure that it is fully opened. Do not climb a closed stepladder.  Make sure that both sides of the stepladder are locked into place.  Ask someone to hold it for you, if possible.  Clearly mark and make sure that you can see:  Any grab bars or handrails.  First and last steps.  Where the edge of each step is.  Use tools that help you move around (mobility aids) if they are needed. These include:  Canes.  Walkers.  Scooters.  Crutches.  Turn on the lights when you go into a dark area. Replace any light bulbs as soon as they burn out.  Set up your furniture so you have a clear path. Avoid moving your furniture around.  If any of your floors are uneven, fix them.  If there are any pets around you, be aware of where they are.  Review your medicines with your doctor. Some medicines can make you feel dizzy. This can increase your chance of falling. Ask your doctor what other things that you can do to help prevent falls. This information is not intended to replace advice given to you by your health care provider. Make sure you  discuss any questions you have with your health care provider. Document Released: 05/30/2009 Document Revised: 01/09/2016 Document Reviewed: 09/07/2014 Elsevier Interactive Patient Education  2017 [employer].

## 2021-01-07 NOTE — Progress Notes (Signed)
Subjective:   Clifford Gould. is a 75 y.o. male who presents for Medicare Annual/Subsequent preventive examination.  Review of Systems    No ROS. Medicare Wellness Visit. Additional risk factors are reflected in social history. Cardiac Risk Factors include: advanced age (>41men, >33 women);dyslipidemia;hypertension;male gender     Objective:    Today's Vitals   01/07/21 1527  BP: 132/70  Pulse: 73  Temp: 98.2 F (36.8 C)  SpO2: 94%  Weight: 170 lb 6.4 oz (77.3 kg)  Height: 5\' 5"  (1.651 m)  PainSc: 0-No pain   Body mass index is 28.36 kg/m.  Advanced Directives 01/07/2021 12/11/2019 10/24/2018  Does Patient Have a Medical Advance Directive? No Yes No  Type of Advance Directive - [place];Living will -  Does patient want to make changes to medical advance directive? - No - Patient declined -  Copy of [place] in Chart? - No - copy requested -  Would patient like information on creating a medical advance directive? No - Patient declined - No - Patient declined    Current Medications (verified) Outpatient Encounter Medications as of 01/07/2021  Medication Sig  . amoxicillin (AMOXIL) 500 MG capsule Take 500 mg by mouth 3 (three) times daily.  Marland Kitchen atorvastatin (LIPITOR) 10 MG tablet Take 1 tablet (10 mg total) by mouth daily.  . diphenhydramine-acetaminophen (TYLENOL PM EXTRA STRENGTH) 25-500 MG TABS tablet Take 1 tablet by mouth at bedtime as needed.  Marland Kitchen losartan (COZAAR) 50 MG tablet Take 1 tablet (50 mg total) by mouth daily.  Marland Kitchen MAGNESIUM CARBONATE PO Take 1 tablet by mouth.  . meclizine (ANTIVERT) 25 MG tablet Take 1 tablet (25 mg total) by mouth 3 (three) times daily as needed for dizziness.  . sildenafil (REVATIO) 20 MG tablet Take 1-5 tablets by mouth daily as needed.  . valACYclovir (VALTREX) 1000 MG tablet Take 2 tablets by mouth twice daily for 1 day as needed for cold sores   No facility-administered encounter medications on  file as of 01/07/2021.    Allergies (verified) Patient has no known allergies.   History: Past Medical History:  Diagnosis Date  . Chicken pox   . Erectile dysfunction   . Heart murmur   . Herpes simplex   . HLD (hyperlipidemia) 06/16/2019   Past Surgical History:  Procedure Laterality Date  . COLONOSCOPY     15+yrs ago- normal per pt.  . SPLENECTOMY     Family History  Problem Relation Age of Onset  . Pancreatic cancer Mother        pancreatic  . Diabetes Father   . Colon cancer Neg Hx    Social History   Socioeconomic History  . Marital status: Widowed    Spouse name: Not on file  . Number of children: 3  . Years of education: 32  . Highest education level: Not on file  Occupational History  . Occupation: Nurse Aide   Tobacco Use  . Smoking status: Never Smoker  . Smokeless tobacco: Never Used  Vaping Use  . Vaping Use: Never used  Substance and Sexual Activity  . Alcohol use: Yes    Alcohol/week: 2.0 - 4.0 standard drinks    Types: 2 - 4 Cans of beer per week    Comment: occ beer  . Drug use: No  . Sexual activity: Never  Other Topics Concern  . Not on file  Social History Narrative   Born and raised in [place], [place]. Currently  resides in a house with his wife. 2 dogs (1 boxer, half-lab/pitbull). Fun: Play golf, tennis and read.    Denies religious beliefs that would effect health care.    Social Determinants of Health   Financial Resource Strain: Low Risk   . Difficulty of Paying Living Expenses: Not hard at all  Food Insecurity: No Food Insecurity  . Worried About Charity fundraiser in the Last Year: Never true  . Ran Out of Food in the Last Year: Never true  Transportation Needs: No Transportation Needs  . Lack of Transportation (Medical): No  . Lack of Transportation (Non-Medical): No  Physical Activity: Sufficiently Active  . Days of Exercise per Week: 5 days  . Minutes of Exercise per Session: 30 min  Stress: No Stress Concern Present  .  Feeling of Stress : Not at all  Social Connections: Moderately Integrated  . Frequency of Communication with Friends and Family: More than three times a week  . Frequency of Social Gatherings with Friends and Family: More than three times a week  . Attends Religious Services: More than 4 times per year  . Active Member of Clubs or Organizations: No  . Attends Archivist Meetings: More than 4 times per year  . Marital Status: Widowed    Tobacco Counseling Counseling given: Not Answered   Clinical Intake:  Pre-visit preparation completed: Yes  Pain : No/denies pain Pain Score: 0-No pain     BMI - recorded: 28.36 Nutritional Status: BMI 25 -29 Overweight Nutritional Risks: None Diabetes: No  How often do you need to have someone help you when you read instructions, pamphlets, or other written materials from your doctor or pharmacy?: 1 - Never What is the last grade level you completed in school?: HSG  Diabetic? no     Information entered by :: Lisette Abu, LPN   Activities of Daily Living In your present state of health, do you have any difficulty performing the following activities: 01/07/2021  Hearing? N  Vision? N  Difficulty concentrating or making decisions? N  Walking or climbing stairs? N  Dressing or bathing? N  Doing errands, shopping? N  Preparing Food and eating ? N  Using the Toilet? N  In the past six months, have you accidently leaked urine? N  Do you have problems with loss of bowel control? N  Managing your Medications? N  Managing your Finances? N  Housekeeping or managing your Housekeeping? N  Some recent data might be hidden    Patient Care Team: Biagio Borg, MD as PCP - General (Internal Medicine) Laurence Aly, [place] (Optometry) Karin Golden, MD as Referring Physician (Dermatology)  Indicate any recent Medical Services you may have received from other than [facility] providers in the past year (date may be approximate).      Assessment:   This is a routine wellness examination for [place].  Hearing/Vision screen No exam data present  Dietary issues and exercise activities discussed: Current Exercise Habits: Home exercise routine, Type of exercise: walking;Other - see comments (golfing, pickle ball; running, leg/hip exercises), Time (Minutes): 30, Frequency (Times/Week): 5, Weekly Exercise (Minutes/Week): 150, Intensity: Moderate, Exercise limited by: orthopedic condition(s)  Goals Addressed            This Visit's Progress   . Patient Stated       My goal is to get back to playing [employer] and Tennis.      Depression Screen PHQ 2/9 Scores 01/07/2021 06/19/2020 12/11/2019 06/16/2019 10/24/2018  04/25/2018 04/23/2017  PHQ - 2 Score 0 1 0 0 0 0 0  PHQ- 9 Score - - - - - - 2    Fall Risk Fall Risk  01/07/2021 06/19/2020 06/19/2020 12/11/2019 06/16/2019  Falls in the past year? 0 0 0 0 0  Comment - - - - -  Number falls in past yr: 0 - 0 0 -  Injury with Fall? 0 - 0 0 -  Risk for fall due to : No Fall Risks - No Fall Risks No Fall Risks -  Follow up Falls evaluation completed - - Falls evaluation completed;Education provided -    FALL RISK PREVENTION PERTAINING TO THE HOME:  Any stairs in or around the home? Yes  If so, are there any without handrails? No  Home free of loose throw rugs in walkways, pet beds, electrical cords, etc? Yes  Adequate lighting in your home to reduce risk of falls? Yes   ASSISTIVE DEVICES UTILIZED TO PREVENT FALLS:  Life alert? No  Use of a cane, walker or w/c? No  Grab bars in the bathroom? Yes  Shower chair or bench in shower? No  Elevated toilet seat or a handicapped toilet? No   TIMED UP AND GO:  Was the test performed? No .  Length of time to ambulate 10 feet: 0 sec.   Gait steady and fast without use of assistive device  Cognitive Function: Normal cognitive status assessed by direct observation by this Nurse Health Advisor. No abnormalities found.           Immunizations Immunization History  Administered Date(s) Administered  . Fluad Quad(high Dose 65+) 06/16/2019  . Influenza, High Dose Seasonal PF 04/25/2018  . PFIZER(Purple Top)SARS-COV-2 Vaccination 10/13/2019, 11/07/2019  . Pneumococcal Conjugate-13 04/16/2014  . Pneumococcal Polysaccharide-23 12/24/2011  . Tetanus 12/24/2011  . Zoster Recombinat (Shingrix) 05/04/2017, 09/22/2017    TDAP status: Up to date  Flu Vaccine status: Up to date  Pneumococcal vaccine status: Up to date  Covid-19 vaccine status: Completed vaccines  Qualifies for Shingles Vaccine? Yes   Zostavax completed Yes   Shingrix Completed?: Yes  Screening Tests Health Maintenance  Topic Date Due  . INFLUENZA VACCINE  03/17/2021  . TETANUS/TDAP  12/23/2021  . COLONOSCOPY (Pts 45-15yrs Insurance coverage will need to be confirmed)  04/26/2023  . COVID-19 Vaccine  Completed  . Hepatitis C Screening  Completed  . HPV VACCINES  Aged Out    Health Maintenance  There are no preventive care reminders to display for this patient.  Colorectal cancer screening: Type of screening: Colonoscopy. Completed 04/25/2013. Repeat every 10 years  Lung Cancer Screening: (Low Dose CT Chest recommended if Age 47-80 years, 30 pack-year currently smoking OR have quit w/in 15years.) does not qualify.   Lung Cancer Screening Referral: no  Additional Screening:  Hepatitis C Screening: does qualify; Completed yes  Vision Screening: Recommended annual ophthalmology exams for early detection of glaucoma and other disorders of the eye. Is the patient up to date with their annual eye exam?  Yes  Who is the provider or what is the name of the office in which the patient attends annual eye exams? Laurence Aly, MD. At [facility] If pt is not established with a provider, would they like to be referred to a provider to establish care? No .   Dental Screening: Recommended annual dental exams for proper oral hygiene  Community  Resource Referral / Chronic Care Management: CRR required this visit?  No  CCM required this visit?  No      Plan:     I have personally reviewed and noted the following in the patient's chart:   . Medical and social history . Use of alcohol, tobacco or illicit drugs  . Current medications and supplements including opioid prescriptions. Patient is not currently taking opioid prescriptions. . Functional ability and status . Nutritional status . Physical activity . Advanced directives . List of other physicians . Hospitalizations, surgeries, and ER visits in previous 12 months . Vitals . Screenings to include cognitive, depression, and falls . Referrals and appointments  In addition, I have reviewed and discussed with patient certain preventive protocols, quality metrics, and best practice recommendations. A written personalized care plan for preventive services as well as general preventive health recommendations were provided to patient.     Sheral Flow, LPN   02/12/6380   Nurse Notes: n/a

## 2021-01-08 ENCOUNTER — Encounter: Payer: Self-pay | Admitting: Internal Medicine

## 2021-01-08 DIAGNOSIS — N1831 Chronic kidney disease, stage 3a: Secondary | ICD-10-CM

## 2021-01-20 ENCOUNTER — Other Ambulatory Visit: Payer: Self-pay

## 2021-01-20 ENCOUNTER — Ambulatory Visit: Payer: Medicare HMO | Admitting: Family Medicine

## 2021-01-20 ENCOUNTER — Encounter: Payer: Self-pay | Admitting: Family Medicine

## 2021-01-20 ENCOUNTER — Ambulatory Visit: Payer: Self-pay

## 2021-01-20 VITALS — BP 128/72 | HR 60 | Ht 65.0 in | Wt 168.8 lb

## 2021-01-20 DIAGNOSIS — M25561 Pain in right knee: Secondary | ICD-10-CM | POA: Diagnosis not present

## 2021-01-20 DIAGNOSIS — G8929 Other chronic pain: Secondary | ICD-10-CM | POA: Diagnosis not present

## 2021-01-20 DIAGNOSIS — M25562 Pain in left knee: Secondary | ICD-10-CM

## 2021-01-20 NOTE — Progress Notes (Signed)
   I, Wendy Poet, LAT, ATC, am serving as scribe for Dr. Lynne Leader.  Clifford Gould. is a 75 y.o. male who presents to [place] at [facility] today for bilat knee pain. Pt was last seen by Dr. Georgina Snell on 04/25/20 for R knee and R ankle pain and was given a R knee steroid injection. Pt was previously seen for his L knee pain on 12/19/19. Today, pt reports that, in general, his knees are feeling better and feels that his  Baker's cysts have resolved . Pt states that his knee pain is intermittent in nature and locates his pain to his B medial knees when he has it.  He is interested in getting an [place] again of both knees to compare to his prior ones to see if anything has changed.  Knee swelling: No Mechanical symptoms: No Aggravates: walking on hard surfaces Treatments tried: Nutrametrix; Tylenol  Dx imaging: 04/25/20 R knee XR  12/19/19 L knee XR  Pertinent review of systems: No fevers or chills  Relevant historical information: Hypertension, CKD   Exam:  BP 128/72 (BP Location: Left Arm, Patient Position: Sitting, Cuff Size: Normal)   Pulse 60   Ht 5\' 5"  (1.651 m)   Wt 168 lb 12.8 oz (76.6 kg)   SpO2 95%   BMI 28.09 kg/m  General: Well Developed, well nourished, and in no acute distress.   MSK: Knees bilaterally normal.  Normal motion     Lab and Radiology Results  Diagnostic Limited MSK Ultrasound of: Bilateral knee No Baker's cyst bilaterally.  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 male with bilateral knee pain significantly improved with quad strength weight loss Tylenol.  Continue home exercise program avoid deep squats and lunges with weight lifting.  Advance activity as tolerated.  Recheck with me as needed.  Precautions reviewed.  Congratulated patient on doing well on his own.   PDMP not reviewed this encounter. Orders Placed This Encounter  Procedures  .  [place] LIMITED JOINT SPACE STRUCTURES LOW BILAT(NO LINKED CHARGES)    Order Specific Question:   Reason for Exam (SYMPTOM  OR DIAGNOSIS REQUIRED)    Answer:   B knee pain    Order Specific Question:   Preferred imaging location?    Answer:   [place]   No orders of the defined types were placed in this encounter.    Discussed warning signs or symptoms. Please see discharge instructions. Patient expresses understanding.   The above documentation has been reviewed and is accurate and complete Lynne Leader, M.D.

## 2021-01-20 NOTE — Telephone Encounter (Signed)
Patient came by too check on the status of the referral. Requesting a call back for an update.   Best callback# [phone]

## 2021-01-20 NOTE — Patient Instructions (Signed)
Thank you for coming in today.  Advance activity as tolerated   Avoid deep squats and lunges.   Recheck as needed.   I love the healthy lifestyle.

## 2021-04-15 DIAGNOSIS — E785 Hyperlipidemia, unspecified: Secondary | ICD-10-CM | POA: Diagnosis not present

## 2021-04-15 DIAGNOSIS — I129 Hypertensive chronic kidney disease with stage 1 through stage 4 chronic kidney disease, or unspecified chronic kidney disease: Secondary | ICD-10-CM | POA: Diagnosis not present

## 2021-04-15 DIAGNOSIS — N1831 Chronic kidney disease, stage 3a: Secondary | ICD-10-CM | POA: Diagnosis not present

## 2021-04-15 DIAGNOSIS — N529 Male erectile dysfunction, unspecified: Secondary | ICD-10-CM | POA: Diagnosis not present

## 2021-06-14 ENCOUNTER — Encounter: Payer: Self-pay | Admitting: Internal Medicine

## 2021-06-14 ENCOUNTER — Other Ambulatory Visit: Payer: Self-pay | Admitting: Internal Medicine

## 2021-06-14 NOTE — Telephone Encounter (Signed)
Please refill as per office routine med refill policy (all routine meds to be refilled for 3 mo or monthly (per pt preference) up to one year from last visit, then month to month grace period for 3 mo, then further med refills will have to be denied) ? ?

## 2021-06-23 ENCOUNTER — Other Ambulatory Visit: Payer: Self-pay | Admitting: Internal Medicine

## 2021-06-23 ENCOUNTER — Other Ambulatory Visit (INDEPENDENT_AMBULATORY_CARE_PROVIDER_SITE_OTHER): Payer: Medicare HMO

## 2021-06-23 DIAGNOSIS — Z0000 Encounter for general adult medical examination without abnormal findings: Secondary | ICD-10-CM

## 2021-06-23 DIAGNOSIS — R739 Hyperglycemia, unspecified: Secondary | ICD-10-CM

## 2021-06-23 DIAGNOSIS — E559 Vitamin D deficiency, unspecified: Secondary | ICD-10-CM

## 2021-06-23 DIAGNOSIS — Z125 Encounter for screening for malignant neoplasm of prostate: Secondary | ICD-10-CM | POA: Diagnosis not present

## 2021-06-23 DIAGNOSIS — E538 Deficiency of other specified B group vitamins: Secondary | ICD-10-CM

## 2021-06-23 DIAGNOSIS — I10 Essential (primary) hypertension: Secondary | ICD-10-CM

## 2021-06-23 LAB — VITAMIN B12: Vitamin B-12: 341 pg/mL (ref 211–911)

## 2021-06-23 LAB — HEPATIC FUNCTION PANEL
ALT: 16 U/L (ref 0–53)
AST: 22 U/L (ref 0–37)
Albumin: 4.4 g/dL (ref 3.5–5.2)
Alkaline Phosphatase: 65 U/L (ref 39–117)
Bilirubin, Direct: 0.1 mg/dL (ref 0.0–0.3)
Total Bilirubin: 0.7 mg/dL (ref 0.2–1.2)
Total Protein: 6.9 g/dL (ref 6.0–8.3)

## 2021-06-23 LAB — LIPID PANEL
Cholesterol: 167 mg/dL (ref 0–200)
HDL: 36.4 mg/dL — ABNORMAL LOW (ref >39.00)
NonHDL: 130.3
Total CHOL/HDL Ratio: 5
Triglycerides: 309 mg/dL — ABNORMAL HIGH (ref 0.0–149.0)
VLDL: 61.8 mg/dL — ABNORMAL HIGH (ref 0.0–40.0)

## 2021-06-23 LAB — CBC WITH DIFFERENTIAL/PLATELET
Basophils Absolute: 0.1 10*3/uL (ref 0.0–0.1)
Basophils Relative: 1.2 % (ref 0.0–3.0)
Eosinophils Absolute: 0.2 10*3/uL (ref 0.0–0.7)
Eosinophils Relative: 2.3 % (ref 0.0–5.0)
HCT: 39.1 % (ref 39.0–52.0)
Hemoglobin: 13.2 g/dL (ref 13.0–17.0)
Lymphocytes Relative: 48 % — ABNORMAL HIGH (ref 12.0–46.0)
Lymphs Abs: 4.5 10*3/uL — ABNORMAL HIGH (ref 0.7–4.0)
MCHC: 33.8 g/dL (ref 30.0–36.0)
MCV: 98.9 fl (ref 78.0–100.0)
Monocytes Absolute: 0.9 10*3/uL (ref 0.1–1.0)
Monocytes Relative: 10.1 % (ref 3.0–12.0)
Neutro Abs: 3.6 10*3/uL (ref 1.4–7.7)
Neutrophils Relative %: 38.4 % — ABNORMAL LOW (ref 43.0–77.0)
Platelets: 403 10*3/uL — ABNORMAL HIGH (ref 150.0–400.0)
RBC: 3.95 Mil/uL — ABNORMAL LOW (ref 4.22–5.81)
RDW: 12.4 % (ref 11.5–15.5)
WBC: 9.3 10*3/uL (ref 4.0–10.5)

## 2021-06-23 LAB — BASIC METABOLIC PANEL
BUN: 22 mg/dL (ref 6–23)
CO2: 25 mEq/L (ref 19–32)
Calcium: 9.4 mg/dL (ref 8.4–10.5)
Chloride: 106 mEq/L (ref 96–112)
Creatinine, Ser: 1.37 mg/dL (ref 0.40–1.50)
GFR: 50.83 mL/min — ABNORMAL LOW (ref >60.00)
Glucose, Bld: 96 mg/dL (ref 70–99)
Potassium: 4.9 mEq/L (ref 3.5–5.1)
Sodium: 138 mEq/L (ref 135–145)

## 2021-06-23 LAB — URINALYSIS, ROUTINE W REFLEX MICROSCOPIC
Bilirubin Urine: NEGATIVE
Hgb urine dipstick: NEGATIVE
Ketones, ur: NEGATIVE
Leukocytes,Ua: NEGATIVE
Nitrite: NEGATIVE
RBC / HPF: NONE SEEN (ref 0–?)
Specific Gravity, Urine: 1.02 (ref 1.000–1.030)
Total Protein, Urine: NEGATIVE
Urine Glucose: NEGATIVE
Urobilinogen, UA: 0.2 (ref 0.0–1.0)
pH: 6 (ref 5.0–8.0)

## 2021-06-23 LAB — HEMOGLOBIN A1C: Hgb A1c MFr Bld: 6.1 % (ref 4.6–6.5)

## 2021-06-23 LAB — PSA: PSA: 2.17 ng/mL (ref 0.10–4.00)

## 2021-06-23 LAB — LDL CHOLESTEROL, DIRECT: Direct LDL: 73 mg/dL

## 2021-06-23 LAB — VITAMIN D 25 HYDROXY (VIT D DEFICIENCY, FRACTURES): VITD: 40.4 ng/mL (ref 30.00–100.00)

## 2021-06-23 LAB — TSH: TSH: 1.84 u[IU]/mL (ref 0.35–5.50)

## 2021-06-24 ENCOUNTER — Other Ambulatory Visit: Payer: Self-pay | Admitting: Internal Medicine

## 2021-06-24 ENCOUNTER — Encounter: Payer: Self-pay | Admitting: Internal Medicine

## 2021-06-24 ENCOUNTER — Other Ambulatory Visit: Payer: Self-pay

## 2021-06-24 ENCOUNTER — Ambulatory Visit (INDEPENDENT_AMBULATORY_CARE_PROVIDER_SITE_OTHER): Payer: Medicare HMO | Admitting: Internal Medicine

## 2021-06-24 VITALS — BP 118/62 | HR 62 | Temp 98.7°F | Ht 65.0 in | Wt 177.0 lb

## 2021-06-24 DIAGNOSIS — I10 Essential (primary) hypertension: Secondary | ICD-10-CM

## 2021-06-24 DIAGNOSIS — Z23 Encounter for immunization: Secondary | ICD-10-CM

## 2021-06-24 DIAGNOSIS — N1831 Chronic kidney disease, stage 3a: Secondary | ICD-10-CM

## 2021-06-24 DIAGNOSIS — R739 Hyperglycemia, unspecified: Secondary | ICD-10-CM

## 2021-06-24 DIAGNOSIS — E7800 Pure hypercholesterolemia, unspecified: Secondary | ICD-10-CM | POA: Diagnosis not present

## 2021-06-24 DIAGNOSIS — N529 Male erectile dysfunction, unspecified: Secondary | ICD-10-CM

## 2021-06-24 DIAGNOSIS — Z0001 Encounter for general adult medical examination with abnormal findings: Secondary | ICD-10-CM | POA: Diagnosis not present

## 2021-06-24 MED ORDER — MECLIZINE HCL 25 MG PO TABS: 25.0000 mg | ORAL_TABLET | Freq: Three times a day (TID) | ORAL | 1 refills | Status: AC | PRN

## 2021-06-24 MED ORDER — LOSARTAN POTASSIUM 50 MG PO TABS: 50.0000 mg | ORAL_TABLET | Freq: Every day | ORAL | 3 refills | Status: DC

## 2021-06-24 MED ORDER — TADALAFIL 20 MG PO TABS: 10.0000 mg | ORAL_TABLET | ORAL | 11 refills | Status: DC | PRN

## 2021-06-24 MED ORDER — ATORVASTATIN CALCIUM 10 MG PO TABS: 10.0000 mg | ORAL_TABLET | Freq: Every day | ORAL | 3 refills | Status: DC

## 2021-06-24 NOTE — Patient Instructions (Signed)
Ok to change the viagra to cialis as needed  Please continue all other medications as before, and refills have been done if requested.  Please call or Mychart message in January 2023 if you need your medication sent to new pharmacy such as [facility]  Please have the pharmacy call with any other refills you may need.  Please continue your efforts at being more active, low cholesterol diet, and weight control.  You are otherwise up to date with prevention measures today.  Please keep your appointments with your specialists as you may have planned  Please make an Appointment to return for your 1 year visit, or sooner if needed, with Lab testing by Appointment as well, to be done about 3-5 days before at the [place] (so this is for TWO appointments - please see the scheduling desk as you leave)   Due to the ongoing Covid 19 pandemic, our lab now requires an appointment for any labs done at our office.  If you need labs done and do not have an appointment, please call our office ahead of time to schedule before presenting to the lab for your testing.

## 2021-06-24 NOTE — Progress Notes (Signed)
Patient ID: Clifford Clos., male   DOB: May 11, 1946, 75 y.o.   MRN: [id number]         Chief Complaint:: wellness exam and worsening ED, htn hld       HPI:  Clifford Lutes. is a 75 y.o. male here for wellness exam; due for flu shot; declines covid booster, o/w up to date                        Also has worsening ED x several months and viagra no longer working well.  Pt denies chest pain, increased sob or doe, wheezing, orthopnea, PND, increased LE swelling, palpitations, dizziness or syncope.   Pt denies polydipsia, polyuria, or new focal neuro s/s.   Pt denies fever, wt loss, night sweats, loss of appetite, or other constitutional symptoms  No other new complaints  Wt Readings from Last 3 Encounters:  06/24/21 177 lb (80.3 kg)  01/20/21 168 lb 12.8 oz (76.6 kg)  01/07/21 170 lb 6.4 oz (77.3 kg)   BP Readings from Last 3 Encounters:  06/24/21 118/62  01/20/21 128/72  01/07/21 132/70   Immunization History  Administered Date(s) Administered   Fluad Quad(high Dose 65+) 06/16/2019, 06/24/2021   Influenza, High Dose Seasonal PF 04/25/2018   Influenza-Unspecified 06/22/2020   PFIZER(Purple Top)SARS-COV-2 Vaccination 10/13/2019, 11/07/2019, 07/26/2020   Pneumococcal Conjugate-13 04/16/2014   Pneumococcal Polysaccharide-23 12/24/2011   Tetanus 12/24/2011   Zoster Recombinat (Shingrix) 05/04/2017, 09/22/2017   There are no preventive care reminders to display for this patient.     Past Medical History:  Diagnosis Date   Chicken pox    Erectile dysfunction    Heart murmur    Herpes simplex    HLD (hyperlipidemia) 06/16/2019   Past Surgical History:  Procedure Laterality Date   COLONOSCOPY     15+yrs ago- normal per pt.   SPLENECTOMY      reports that he has never smoked. He has never used smokeless tobacco. He reports current alcohol use of about 2.0 - 4.0 standard drinks per week. He reports that he does not use drugs. family history includes Diabetes in his father;  Pancreatic cancer in his mother. No Known Allergies Current Outpatient Medications on File Prior to Visit  Medication Sig Dispense Refill   diphenhydramine-acetaminophen (TYLENOL PM) 25-500 MG TABS tablet Take 1 tablet by mouth at bedtime as needed.     MAGNESIUM CARBONATE PO Take 1 tablet by mouth.     valACYclovir (VALTREX) 1000 MG tablet Take 2 tablets by mouth twice daily for 1 day as needed for cold sores 30 tablet 0   No current facility-administered medications on file prior to visit.        ROS:  All others reviewed and negative.  Objective        PE:  BP 118/62 (BP Location: Right Arm, Patient Position: Sitting, Cuff Size: Large)   Pulse 62   Temp 98.7 F (37.1 C) (Oral)   Ht 5\' 5"  (1.651 m)   Wt 177 lb (80.3 kg)   SpO2 95%   BMI 29.45 kg/m                 Constitutional: Pt appears in NAD               HENT: Head: NCAT.                Right Ear: External ear normal.  Left Ear: External ear normal.                Eyes: . Pupils are equal, round, and reactive to light. Conjunctivae and EOM are normal               Nose: without d/c or deformity               Neck: Neck supple. Gross normal ROM               Cardiovascular: Normal rate and regular rhythm.                 Pulmonary/Chest: Effort normal and breath sounds without rales or wheezing.                Abd:  Soft, NT, ND, + BS, no organomegaly               Neurological: Pt is alert. At baseline orientation, motor grossly intact               Skin: Skin is warm. No rashes, no other new lesions, LE edema - none               Psychiatric: Pt behavior is normal without agitation   Micro: none  Cardiac tracings I have personally interpreted today:  none  Pertinent Radiological findings (summarize): none   Lab Results  Component Value Date   WBC 9.3 06/23/2021   HGB 13.2 06/23/2021   HCT 39.1 06/23/2021   PLT 403.0 (H) 06/23/2021   GLUCOSE 96 06/23/2021   CHOL 167 06/23/2021   TRIG 309.0  (H) 06/23/2021   HDL 36.40 (L) 06/23/2021   LDLDIRECT 73.0 06/23/2021   LDLCALC 56 12/16/2020   ALT 16 06/23/2021   AST 22 06/23/2021   NA 138 06/23/2021   K 4.9 06/23/2021   CL 106 06/23/2021   CREATININE 1.37 06/23/2021   BUN 22 06/23/2021   CO2 25 06/23/2021   TSH 1.84 06/23/2021   PSA 2.17 06/23/2021   INR 0.89 09/25/2009   HGBA1C 6.1 06/23/2021   MICROALBUR <0.7 12/16/2020   Assessment/Plan:  Clifford Steele. is a 75 y.o. White or Caucasian [1] male with  has a past medical history of Chicken pox, Erectile dysfunction, Heart murmur, Herpes simplex, and HLD (hyperlipidemia) (06/16/2019).  Encounter for well adult exam with abnormal findings Age and sex appropriate education and counseling updated with regular exercise and diet Referrals for preventative services - none needed Immunizations addressed - declines covid booster Smoking counseling  - none needed Evidence for depression or other mood disorder - none significant Most recent labs reviewed. I have personally reviewed and have noted: 1) the patient's medical and social history 2) The patient's current medications and supplements 3) The patient's height, weight, and BMI have been recorded in the chart   HLD (hyperlipidemia) Lab Results  Component Value Date   LDLCALC 56 12/16/2020   Stable, pt to continue current statin lipitor 10   Hyperglycemia Lab Results  Component Value Date   HGBA1C 6.1 06/23/2021   Stable, pt to continue current medical treatment  - diet   CKD (chronic kidney disease) stage 3, GFR 30-59 ml/min Lab Results  Component Value Date   CREATININE 1.37 06/23/2021   Stable overall, cont to avoid nephrotoxins   HTN (hypertension) BP Readings from Last 3 Encounters:  06/24/21 118/62  01/20/21 128/72  01/07/21 132/70   Stable, pt to continue medical treatment losartan 50  Erectile dysfunction Worsening uncontrolled, ok for trial change viagra to cialis prn,  to f/u any  worsening symptoms or concerns  Followup: Return in about 1 year (around 06/24/2022).  Cathlean Cower, MD 06/24/2021 9:32 PM [facility]

## 2021-06-24 NOTE — Telephone Encounter (Signed)
Ok to let pt know - it appears his insurance will not cover meclizine, so we can only encourage him to pay out of pocket as there is no good alternative

## 2021-06-24 NOTE — Assessment & Plan Note (Signed)
BP Readings from Last 3 Encounters:  06/24/21 118/62  01/20/21 128/72  01/07/21 132/70   Stable, pt to continue medical treatment losartan 50

## 2021-06-24 NOTE — Assessment & Plan Note (Signed)
Lab Results  Component Value Date   CREATININE 1.37 06/23/2021   Stable overall, cont to avoid nephrotoxins

## 2021-06-24 NOTE — Assessment & Plan Note (Signed)
Worsening uncontrolled, ok for trial change viagra to cialis prn,  to f/u any worsening symptoms or concerns

## 2021-06-24 NOTE — Assessment & Plan Note (Signed)
Lab Results  Component Value Date   HGBA1C 6.1 06/23/2021   Stable, pt to continue current medical treatment  - diet

## 2021-06-24 NOTE — Assessment & Plan Note (Signed)
Lab Results  Component Value Date   LDLCALC 56 12/16/2020   Stable, pt to continue current statin lipitor 10

## 2021-06-24 NOTE — Assessment & Plan Note (Signed)

## 2021-06-25 NOTE — Telephone Encounter (Signed)
Patient states that he does not need prescription at this time

## 2021-12-31 ENCOUNTER — Telehealth: Payer: Self-pay | Admitting: Internal Medicine

## 2021-12-31 DIAGNOSIS — C44321 Squamous cell carcinoma of skin of nose: Secondary | ICD-10-CM | POA: Diagnosis not present

## 2021-12-31 DIAGNOSIS — L814 Other melanin hyperpigmentation: Secondary | ICD-10-CM | POA: Diagnosis not present

## 2021-12-31 DIAGNOSIS — L821 Other seborrheic keratosis: Secondary | ICD-10-CM | POA: Diagnosis not present

## 2021-12-31 DIAGNOSIS — Z85828 Personal history of other malignant neoplasm of skin: Secondary | ICD-10-CM | POA: Diagnosis not present

## 2021-12-31 DIAGNOSIS — R229 Localized swelling, mass and lump, unspecified: Secondary | ICD-10-CM | POA: Diagnosis not present

## 2021-12-31 DIAGNOSIS — Z08 Encounter for follow-up examination after completed treatment for malignant neoplasm: Secondary | ICD-10-CM | POA: Diagnosis not present

## 2021-12-31 DIAGNOSIS — H52229 Regular astigmatism, unspecified eye: Secondary | ICD-10-CM | POA: Diagnosis not present

## 2021-12-31 DIAGNOSIS — L570 Actinic keratosis: Secondary | ICD-10-CM | POA: Diagnosis not present

## 2021-12-31 NOTE — Telephone Encounter (Signed)
Pt requesting orders for blood work prior to scheduled cpe appt.  ?

## 2022-01-01 ENCOUNTER — Other Ambulatory Visit: Payer: Self-pay | Admitting: Internal Medicine

## 2022-01-01 DIAGNOSIS — R739 Hyperglycemia, unspecified: Secondary | ICD-10-CM

## 2022-01-01 DIAGNOSIS — E538 Deficiency of other specified B group vitamins: Secondary | ICD-10-CM

## 2022-01-01 DIAGNOSIS — E559 Vitamin D deficiency, unspecified: Secondary | ICD-10-CM

## 2022-01-01 DIAGNOSIS — Z125 Encounter for screening for malignant neoplasm of prostate: Secondary | ICD-10-CM

## 2022-01-01 DIAGNOSIS — E7800 Pure hypercholesterolemia, unspecified: Secondary | ICD-10-CM

## 2022-01-01 NOTE — Telephone Encounter (Signed)
Orders done

## 2022-01-01 NOTE — Telephone Encounter (Signed)
Notified patient via voicemail. 

## 2022-01-08 ENCOUNTER — Ambulatory Visit (INDEPENDENT_AMBULATORY_CARE_PROVIDER_SITE_OTHER): Payer: No Typology Code available for payment source

## 2022-01-08 DIAGNOSIS — Z0000 Encounter for general adult medical examination without abnormal findings: Secondary | ICD-10-CM | POA: Diagnosis not present

## 2022-01-08 NOTE — Progress Notes (Signed)
I connected with Clifford Gould today by telephone and verified that I am speaking with the correct person using two identifiers. Location patient: home Location provider: work Persons participating in the virtual visit: patient, provider.   I discussed the limitations, risks, security and privacy concerns of performing an evaluation and management service by telephone and the availability of in person appointments. I also discussed with the patient that there may be a patient responsible charge related to this service. The patient expressed understanding and verbally consented to this telephonic visit.    Interactive audio and video telecommunications were attempted between this provider and patient, however failed, due to patient having technical difficulties OR patient did not have access to video capability.  We continued and completed visit with audio only.  Some vital signs may be absent or patient reported.   Time Spent with patient on telephone encounter: 30 minutes  Subjective:   Clifford Gould. is a 76 y.o. male who presents for Medicare Annual/Subsequent preventive examination.  Review of Systems     Cardiac Risk Factors include: advanced age (>38mn, >>29women);dyslipidemia;hypertension;male gender     Objective:    There were no vitals filed for this visit. There is no height or weight on file to calculate BMI.     01/08/2022   11:00 AM 01/07/2021    4:26 PM 12/11/2019    8:41 AM 10/24/2018    8:32 AM  Advanced Directives  Does Patient Have a Medical Advance Directive? Yes No Yes No  Type of Advance Directive Living will;Healthcare Power of [place]   Does patient want to make changes to medical advance directive? No - Patient declined  No - Patient declined   Copy of HWidenerin Chart? No - copy requested  No - copy requested   Would patient like information on creating a medical advance directive?  No  - Patient declined  No - Patient declined    Current Medications (verified) Outpatient Encounter Medications as of 01/08/2022  Medication Sig   atorvastatin (LIPITOR) 10 MG tablet Take 1 tablet (10 mg total) by mouth daily.   diphenhydramine-acetaminophen (TYLENOL PM) 25-500 MG TABS tablet Take 1 tablet by mouth at bedtime as needed.   losartan (COZAAR) 50 MG tablet Take 1 tablet (50 mg total) by mouth daily.   MAGNESIUM CARBONATE PO Take 1 tablet by mouth.   meclizine (ANTIVERT) 25 MG tablet Take 1 tablet (25 mg total) by mouth 3 (three) times daily as needed for dizziness.   tadalafil (CIALIS) 20 MG tablet Take 0.5-1 tablets (10-20 mg total) by mouth every other day as needed for erectile dysfunction.   valACYclovir (VALTREX) 1000 MG tablet Take 2 tablets by mouth twice daily for 1 day as needed for cold sores   No facility-administered encounter medications on file as of 01/08/2022.    Allergies (verified) Patient has no known allergies.   History: Past Medical History:  Diagnosis Date   Chicken pox    Erectile dysfunction    Heart murmur    Herpes simplex    HLD (hyperlipidemia) 06/16/2019   Past Surgical History:  Procedure Laterality Date   COLONOSCOPY     15+yrs ago- normal per pt.   SPLENECTOMY     Family History  Problem Relation Age of Onset   Pancreatic cancer Mother        pancreatic   Diabetes Father    Colon cancer Neg Hx  Social History   Socioeconomic History   Marital status: Widowed    Spouse name: Not on file   Number of children: 3   Years of education: 73   Highest education level: Not on file  Occupational History   Occupation: Nurse Aide   Tobacco Use   Smoking status: Never   Smokeless tobacco: Never  Vaping Use   Vaping Use: Never used  Substance and Sexual Activity   Alcohol use: Yes    Alcohol/week: 2.0 - 4.0 standard drinks    Types: 2 - 4 Cans of beer per week    Comment: occ beer   Drug use: No   Sexual activity: Never   Other Topics Concern   Not on file  Social History Narrative   Born and raised in [place], [place]. Currently resides in a house with his wife. 2 dogs (1 boxer, half-lab/pitbull). Fun: Play golf, tennis and read.    Denies religious beliefs that would effect health care.    Social Determinants of Health   Financial Resource Strain: Low Risk    Difficulty of Paying Living Expenses: Not hard at all  Food Insecurity: No Food Insecurity   Worried About Charity fundraiser in the Last Year: Never true   [place] in the Last Year: Never true  Transportation Needs: No Transportation Needs   Lack of Transportation (Medical): No   Lack of Transportation (Non-Medical): No  Physical Activity: Sufficiently Active   Days of Exercise per Week: 5 days   Minutes of Exercise per Session: 30 min  Stress: No Stress Concern Present   Feeling of Stress : Not at all  Social Connections: Moderately Integrated   Frequency of Communication with Friends and Family: More than three times a week   Frequency of Social Gatherings with Friends and Family: More than three times a week   Attends Religious Services: More than 4 times per year   Active Member of Clubs or Organizations: No   Attends Music therapist: More than 4 times per year   Marital Status: Widowed    Tobacco Counseling Counseling given: Not Answered   Clinical Intake:  Pre-visit preparation completed: Yes  Pain : No/denies pain     BMI - recorded: 29.42 Nutritional Status: BMI 25 -29 Overweight Nutritional Risks: None Diabetes: No  How often do you need to have someone help you when you read instructions, pamphlets, or other written materials from your doctor or pharmacy?: 1 - Never What is the last grade level you completed in school?: College Degree  Diabetic? no  Interpreter Needed?: No  Information entered by :: Lisette Abu, LPN   Activities of Daily Living    01/08/2022   11:01 AM  In your  present state of health, do you have any difficulty performing the following activities:  Hearing? 0  Vision? 0  Difficulty concentrating or making decisions? 0  Walking or climbing stairs? 0  Dressing or bathing? 0  Doing errands, shopping? 0  Preparing Food and eating ? N  Using the Toilet? N  In the past six months, have you accidently leaked urine? N  Do you have problems with loss of bowel control? N  Managing your Medications? N  Managing your Finances? N  Housekeeping or managing your Housekeeping? N    Patient Care Team: Biagio Borg, MD as PCP - General (Internal Medicine) Laurence Aly, [place] (Optometry) Karin Golden, MD as Referring Physician (Dermatology)  Indicate any recent Medical  Services you may have received from other than [facility] providers in the past year (date may be approximate).     Assessment:   This is a routine wellness examination for Drew.  Hearing/Vision screen Hearing Screening - Comments:: Patient denied any hearing difficulty.   No hearing aids.  Vision Screening - Comments:: Patient does wear corrective lenses/contacts.  Eye exam done by: Dr. Laurence Aly at [facility]    Dietary issues and exercise activities discussed: Current Exercise Habits: Home exercise routine, Type of exercise: walking;Other - see comments (Plays golf and pickle ball), Time (Minutes): 30, Frequency (Times/Week): 5, Weekly Exercise (Minutes/Week): 150, Intensity: Moderate, Exercise limited by: orthopedic condition(s)   Goals Addressed             This Visit's Progress    My goal is to make it to 2024 and lose about 5-10 pounds.        Depression Screen    01/08/2022   10:59 AM 06/24/2021    1:15 PM 06/24/2021    1:05 PM 01/07/2021    4:24 PM 06/19/2020   10:01 AM 12/11/2019    8:42 AM 06/16/2019   11:01 AM  PHQ 2/9 Scores  PHQ - 2 Score 0 0 0 0 1 0 0    Fall Risk    01/08/2022   11:01 AM 06/24/2021    1:15 PM 06/24/2021    1:05 PM 01/07/2021    4:26 PM  06/19/2020   10:01 AM  Fall Risk   Falls in the past year? 0 0 0 0 0  Number falls in past yr: 0 0 0 0   Injury with Fall? 0 0 0 0   Risk for fall due to : No Fall Risks   No Fall Risks   Follow up Falls evaluation completed   Falls evaluation completed     FALL RISK PREVENTION PERTAINING TO THE HOME:  Any stairs in or around the home? No  If so, are there any without handrails? No  Home free of loose throw rugs in walkways, pet beds, electrical cords, etc? Yes  Adequate lighting in your home to reduce risk of falls? Yes   ASSISTIVE DEVICES UTILIZED TO PREVENT FALLS:  Life alert? No  Use of a cane, walker or w/c? No  Grab bars in the bathroom? Yes  Shower chair or bench in shower? No  Elevated toilet seat or a handicapped toilet? No   TIMED UP AND GO:  Was the test performed? No .  Length of time to ambulate 10 feet: n/a sec.   Appearance of gait: Patient not evaluated for gait during this visit.  Cognitive Function:        01/08/2022   11:02 AM  6CIT Screen  What Year? 0 points  What month? 0 points  What time? 0 points  Count back from 20 0 points  Months in reverse 0 points  Repeat phrase 0 points  Total Score 0 points    Immunizations Immunization History  Administered Date(s) Administered   Fluad Quad(high Dose 65+) 06/16/2019, 06/24/2021   Influenza, High Dose Seasonal PF 04/25/2018   Influenza-Unspecified 06/22/2020   PFIZER(Purple Top)SARS-COV-2 Vaccination 10/13/2019, 11/07/2019, 07/26/2020   Pneumococcal Conjugate-13 04/16/2014   Pneumococcal Polysaccharide-23 12/24/2011   Tetanus 12/24/2011   Zoster Recombinat (Shingrix) 05/04/2017, 09/22/2017    TDAP status: Due, Education has been provided regarding the importance of this vaccine. Advised may receive this vaccine at local pharmacy or Health Dept. Aware to provide a copy of  the vaccination record if obtained from local pharmacy or Health Dept. Verbalized acceptance and understanding.  Flu  Vaccine status: Up to date  Pneumococcal vaccine status: Up to date  Covid-19 vaccine status: Completed vaccines  Qualifies for Shingles Vaccine? Yes   Zostavax completed No   Shingrix Completed?: Yes  Screening Tests Health Maintenance  Topic Date Due   COVID-19 Vaccine (4 - Booster for Pfizer series) 09/20/2020   TETANUS/TDAP  12/23/2021   INFLUENZA VACCINE  03/17/2022   COLONOSCOPY (Pts 45-27yr Insurance coverage will need to be confirmed)  04/26/2023   Hepatitis C Screening  Completed   Zoster Vaccines- Shingrix  Completed   HPV VACCINES  Aged Out   Pneumonia Vaccine 76 Years old  DHoly CrossMaintenance Due  Topic Date Due   COVID-19 Vaccine (4 - Booster for [place]) 09/20/2020   TETANUS/TDAP  12/23/2021    Colorectal cancer screening: Type of screening: Colonoscopy. Completed 04/25/2013. Repeat every 10 years  Lung Cancer Screening: (Low Dose CT Chest recommended if Age 459-80years, 30 pack-year currently smoking OR have quit w/in 15years.) does not qualify.   Lung Cancer Screening Referral: no  Additional Screening:  Hepatitis C Screening: does qualify; Completed 04/21/2016  Vision Screening: Recommended annual ophthalmology exams for early detection of glaucoma and other disorders of the eye. Is the patient up to date with their annual eye exam?  Yes  Who is the provider or what is the name of the office in which the patient attends annual eye exams? JLaurence Aly OD. If pt is not established with a provider, would they like to be referred to a provider to establish care? No .   Dental Screening: Recommended annual dental exams for proper oral hygiene  Community Resource Referral / Chronic Care Management: CRR required this visit?  No   CCM required this visit?  No      Plan:     I have personally reviewed and noted the following in the patient's chart:   Medical and social history Use of alcohol, tobacco or illicit  drugs  Current medications and supplements including opioid prescriptions. Patient is not currently taking opioid prescriptions. Functional ability and status Nutritional status Physical activity Advanced directives List of other physicians Hospitalizations, surgeries, and ER visits in previous 12 months Vitals Screenings to include cognitive, depression, and falls Referrals and appointments  In addition, I have reviewed and discussed with patient certain preventive protocols, quality metrics, and best practice recommendations. A written personalized care plan for preventive services as well as general preventive health recommendations were provided to patient.     SSheral Flow LPN   [id number]  Nurse Notes:  There were no vitals filed for this visit. There is no height or weight on file to calculate BMI. Patient stated that he has no issues with gait or balance; does not use any assistive devices.

## 2022-01-08 NOTE — Patient Instructions (Signed)
[name] , Thank you for taking time to come for your Medicare Wellness Visit. I appreciate your ongoing commitment to your health goals. Please review the following plan we discussed and let me know if I can assist you in the future.   Screening recommendations/referrals: Colonoscopy: 04/25/2013; due every 10 years Recommended yearly ophthalmology/optometry visit for glaucoma screening and checkup Recommended yearly dental visit for hygiene and checkup  Vaccinations: Influenza vaccine: 06/24/2021 Pneumococcal vaccine: 12/24/2011, 04/16/2014 Tdap vaccine: 12/24/2011; due every 10 years (overdue) Shingles vaccine: 05/04/2017, 09/22/2017   Covid-19: 10/13/2019, 11/07/2019, 07/26/2020  Advanced directives: Yes; Please bring a copy of your health care power of attorney and living will to the office at your convenience.  Conditions/risks identified: Yes  Next appointment: Please schedule your next Medicare Wellness Visit with your Nurse Health Advisor in 1 year by calling [phone].  Preventive Care 18 Years and Older, Male Preventive care refers to lifestyle choices and visits with your health care provider that can promote health and wellness. What does preventive care include? A yearly physical exam. This is also called an annual well check. Dental exams once or twice a year. Routine eye exams. Ask your health care provider how often you should have your eyes checked. Personal lifestyle choices, including: Daily care of your teeth and gums. Regular physical activity. Eating a healthy diet. Avoiding tobacco and drug use. Limiting alcohol use. Practicing safe sex. Taking low doses of aspirin every day. Taking vitamin and mineral supplements as recommended by your health care provider. What happens during an annual well check? The services and screenings done by your health care provider during your annual well check will depend on your age, overall health, lifestyle risk factors, and family  history of disease. Counseling  Your health care provider may ask you questions about your: Alcohol use. Tobacco use. Drug use. Emotional well-being. Home and relationship well-being. Sexual activity. Eating habits. History of falls. Memory and ability to understand (cognition). Work and work Statistician. Screening  You may have the following tests or measurements: Height, weight, and BMI. Blood pressure. Lipid and cholesterol levels. These may be checked every 5 years, or more frequently if you are over 28 years old. Skin check. Lung cancer screening. You may have this screening every year starting at age 4 if you have a 30-pack-year history of smoking and currently smoke or have quit within the past 15 years. Fecal occult blood test (FOBT) of the stool. You may have this test every year starting at age 53. Flexible sigmoidoscopy or colonoscopy. You may have a sigmoidoscopy every 5 years or a colonoscopy every 10 years starting at age 39. Prostate cancer screening. Recommendations will vary depending on your family history and other risks. Hepatitis C blood test. Hepatitis B blood test. Sexually transmitted disease (STD) testing. Diabetes screening. This is done by checking your blood sugar (glucose) after you have not eaten for a while (fasting). You may have this done every 1-3 years. Abdominal aortic aneurysm (AAA) screening. You may need this if you are a current or former smoker. Osteoporosis. You may be screened starting at age 68 if you are at high risk. Talk with your health care provider about your test results, treatment options, and if necessary, the need for more tests. Vaccines  Your health care provider may recommend certain vaccines, such as: Influenza vaccine. This is recommended every year. Tetanus, diphtheria, and acellular pertussis (Tdap, Td) vaccine. You may need a Td booster every 10 years. Zoster vaccine. You may  need this after age 22. Pneumococcal  13-valent conjugate (PCV13) vaccine. One dose is recommended after age 21. Pneumococcal polysaccharide (PPSV23) vaccine. One dose is recommended after age 31. Talk to your health care provider about which screenings and vaccines you need and how often you need them. This information is not intended to replace advice given to you by your health care provider. Make sure you discuss any questions you have with your health care provider. Document Released: 08/30/2015 Document Revised: 04/22/2016 Document Reviewed: 06/04/2015 Elsevier Interactive Patient Education  2017 [place] Prevention in the Home Falls can cause injuries. They can happen to people of all ages. There are many things you can do to make your home safe and to help prevent falls. What can I do on the outside of my home? Regularly fix the edges of walkways and driveways and fix any cracks. Remove anything that might make you trip as you walk through a door, such as a raised step or threshold. Trim any bushes or trees on the path to your home. Use bright outdoor lighting. Clear any walking paths of anything that might make someone trip, such as rocks or tools. Regularly check to see if handrails are loose or broken. Make sure that both sides of any steps have handrails. Any raised decks and porches should have guardrails on the edges. Have any leaves, snow, or ice cleared regularly. Use sand or salt on walking paths during winter. Clean up any spills in your garage right away. This includes oil or grease spills. What can I do in the bathroom? Use night lights. Install grab bars by the toilet and in the tub and shower. Do not use towel bars as grab bars. Use non-skid mats or decals in the tub or shower. If you need to sit down in the shower, use a plastic, non-slip stool. Keep the floor dry. Clean up any water that spills on the floor as soon as it happens. Remove soap buildup in the tub or shower regularly. Attach  bath mats securely with double-sided non-slip rug tape. Do not have throw rugs and other things on the floor that can make you trip. What can I do in the bedroom? Use night lights. Make sure that you have a light by your bed that is easy to reach. Do not use any sheets or blankets that are too big for your bed. They should not hang down onto the floor. Have a firm chair that has side arms. You can use this for support while you get dressed. Do not have throw rugs and other things on the floor that can make you trip. What can I do in the kitchen? Clean up any spills right away. Avoid walking on wet floors. Keep items that you use a lot in easy-to-reach places. If you need to reach something above you, use a strong step stool that has a grab bar. Keep electrical cords out of the way. Do not use floor polish or wax that makes floors slippery. If you must use wax, use non-skid floor wax. Do not have throw rugs and other things on the floor that can make you trip. What can I do with my stairs? Do not leave any items on the stairs. Make sure that there are handrails on both sides of the stairs and use them. Fix handrails that are broken or loose. Make sure that handrails are as long as the stairways. Check any carpeting to make sure that it is firmly attached  to the stairs. Fix any carpet that is loose or worn. Avoid having throw rugs at the top or bottom of the stairs. If you do have throw rugs, attach them to the floor with carpet tape. Make sure that you have a light switch at the top of the stairs and the bottom of the stairs. If you do not have them, ask someone to add them for you. What else can I do to help prevent falls? Wear shoes that: Do not have high heels. Have rubber bottoms. Are comfortable and fit you well. Are closed at the toe. Do not wear sandals. If you use a stepladder: Make sure that it is fully opened. Do not climb a closed stepladder. Make sure that both sides of the  stepladder are locked into place. Ask someone to hold it for you, if possible. Clearly mark and make sure that you can see: Any grab bars or handrails. First and last steps. Where the edge of each step is. Use tools that help you move around (mobility aids) if they are needed. These include: Canes. Walkers. Scooters. Crutches. Turn on the lights when you go into a dark area. Replace any light bulbs as soon as they burn out. Set up your furniture so you have a clear path. Avoid moving your furniture around. If any of your floors are uneven, fix them. If there are any pets around you, be aware of where they are. Review your medicines with your doctor. Some medicines can make you feel dizzy. This can increase your chance of falling. Ask your doctor what other things that you can do to help prevent falls. This information is not intended to replace advice given to you by your health care provider. Make sure you discuss any questions you have with your health care provider. Document Released: 05/30/2009 Document Revised: 01/09/2016 Document Reviewed: 09/07/2014 Elsevier Interactive Patient Education  2017 [employer].

## 2022-03-13 ENCOUNTER — Telehealth: Payer: Self-pay | Admitting: Internal Medicine

## 2022-03-13 NOTE — Telephone Encounter (Signed)
PT visits today with results to go over with Dr.John or clinical staff. Results have been left in Dr.John's mailbox.  CB if needed: [phone]

## 2022-05-24 ENCOUNTER — Other Ambulatory Visit: Payer: Self-pay | Admitting: Internal Medicine

## 2022-05-25 NOTE — Telephone Encounter (Signed)
Please refill as per office routine med refill policy (all routine meds to be refilled for 3 mo or monthly (per pt preference) up to one year from last visit, then month to month grace period for 3 mo, then further med refills will have to be denied) ? ?

## 2022-06-21 ENCOUNTER — Other Ambulatory Visit: Payer: Self-pay | Admitting: Internal Medicine

## 2022-06-21 NOTE — Telephone Encounter (Signed)
Please refill as per office routine med refill policy (all routine meds to be refilled for 3 mo or monthly (per pt preference) up to one year from last visit, then month to month grace period for 3 mo, then further med refills will have to be denied) ? ?

## 2022-06-24 ENCOUNTER — Other Ambulatory Visit (INDEPENDENT_AMBULATORY_CARE_PROVIDER_SITE_OTHER): Payer: No Typology Code available for payment source

## 2022-06-24 DIAGNOSIS — Z125 Encounter for screening for malignant neoplasm of prostate: Secondary | ICD-10-CM

## 2022-06-24 DIAGNOSIS — E538 Deficiency of other specified B group vitamins: Secondary | ICD-10-CM

## 2022-06-24 DIAGNOSIS — E559 Vitamin D deficiency, unspecified: Secondary | ICD-10-CM

## 2022-06-24 DIAGNOSIS — R739 Hyperglycemia, unspecified: Secondary | ICD-10-CM | POA: Diagnosis not present

## 2022-06-24 DIAGNOSIS — E7800 Pure hypercholesterolemia, unspecified: Secondary | ICD-10-CM

## 2022-06-24 LAB — BASIC METABOLIC PANEL
BUN: 32 mg/dL — ABNORMAL HIGH (ref 6–23)
CO2: 25 mEq/L (ref 19–32)
Calcium: 9.5 mg/dL (ref 8.4–10.5)
Chloride: 105 mEq/L (ref 96–112)
Creatinine, Ser: 1.6 mg/dL — ABNORMAL HIGH (ref 0.40–1.50)
GFR: 41.9 mL/min — ABNORMAL LOW (ref >60.00)
Glucose, Bld: 86 mg/dL (ref 70–99)
Potassium: 4.8 mEq/L (ref 3.5–5.1)
Sodium: 137 mEq/L (ref 135–145)

## 2022-06-24 LAB — TSH: TSH: 2.35 u[IU]/mL (ref 0.35–5.50)

## 2022-06-24 LAB — LIPID PANEL
Cholesterol: 145 mg/dL (ref 0–200)
HDL: 36.9 mg/dL — ABNORMAL LOW (ref >39.00)
NonHDL: 108.42
Total CHOL/HDL Ratio: 4
Triglycerides: 238 mg/dL — ABNORMAL HIGH (ref 0.0–149.0)
VLDL: 47.6 mg/dL — ABNORMAL HIGH (ref 0.0–40.0)

## 2022-06-24 LAB — CBC WITH DIFFERENTIAL/PLATELET
Basophils Absolute: 0.1 10*3/uL (ref 0.0–0.1)
Basophils Relative: 1.1 % (ref 0.0–3.0)
Eosinophils Absolute: 0.3 10*3/uL (ref 0.0–0.7)
Eosinophils Relative: 2.2 % (ref 0.0–5.0)
HCT: 36.9 % — ABNORMAL LOW (ref 39.0–52.0)
Hemoglobin: 12.3 g/dL — ABNORMAL LOW (ref 13.0–17.0)
Lymphocytes Relative: 51.1 % — ABNORMAL HIGH (ref 12.0–46.0)
Lymphs Abs: 5.8 10*3/uL — ABNORMAL HIGH (ref 0.7–4.0)
MCHC: 33.3 g/dL (ref 30.0–36.0)
MCV: 98.7 fl (ref 78.0–100.0)
Monocytes Absolute: 1 10*3/uL (ref 0.1–1.0)
Monocytes Relative: 8.7 % (ref 3.0–12.0)
Neutro Abs: 4.2 10*3/uL (ref 1.4–7.7)
Neutrophils Relative %: 36.9 % — ABNORMAL LOW (ref 43.0–77.0)
Platelets: 388 10*3/uL (ref 150.0–400.0)
RBC: 3.74 Mil/uL — ABNORMAL LOW (ref 4.22–5.81)
RDW: 12.4 % (ref 11.5–15.5)
WBC: 11.3 10*3/uL — ABNORMAL HIGH (ref 4.0–10.5)

## 2022-06-24 LAB — PSA: PSA: 1.88 ng/mL (ref 0.10–4.00)

## 2022-06-24 LAB — URINALYSIS, ROUTINE W REFLEX MICROSCOPIC
Bilirubin Urine: NEGATIVE
Hgb urine dipstick: NEGATIVE
Ketones, ur: NEGATIVE
Leukocytes,Ua: NEGATIVE
Nitrite: NEGATIVE
RBC / HPF: NONE SEEN (ref 0–?)
Specific Gravity, Urine: 1.025 (ref 1.000–1.030)
Total Protein, Urine: NEGATIVE
Urine Glucose: NEGATIVE
Urobilinogen, UA: 0.2 (ref 0.0–1.0)
WBC, UA: NONE SEEN (ref 0–?)
pH: 5.5 (ref 5.0–8.0)

## 2022-06-24 LAB — HEPATIC FUNCTION PANEL
ALT: 15 U/L (ref 0–53)
AST: 22 U/L (ref 0–37)
Albumin: 4.6 g/dL (ref 3.5–5.2)
Alkaline Phosphatase: 62 U/L (ref 39–117)
Bilirubin, Direct: 0.1 mg/dL (ref 0.0–0.3)
Total Bilirubin: 0.7 mg/dL (ref 0.2–1.2)
Total Protein: 7.3 g/dL (ref 6.0–8.3)

## 2022-06-24 LAB — VITAMIN D 25 HYDROXY (VIT D DEFICIENCY, FRACTURES): VITD: 34.75 ng/mL (ref 30.00–100.00)

## 2022-06-24 LAB — HEMOGLOBIN A1C: Hgb A1c MFr Bld: 6 % (ref 4.6–6.5)

## 2022-06-24 LAB — VITAMIN B12: Vitamin B-12: 348 pg/mL (ref 211–911)

## 2022-06-24 LAB — LDL CHOLESTEROL, DIRECT: Direct LDL: 73 mg/dL

## 2022-06-29 ENCOUNTER — Other Ambulatory Visit: Payer: No Typology Code available for payment source

## 2022-07-01 ENCOUNTER — Other Ambulatory Visit: Payer: Self-pay | Admitting: Internal Medicine

## 2022-07-03 ENCOUNTER — Ambulatory Visit (INDEPENDENT_AMBULATORY_CARE_PROVIDER_SITE_OTHER): Payer: No Typology Code available for payment source | Admitting: Internal Medicine

## 2022-07-03 ENCOUNTER — Encounter: Payer: Self-pay | Admitting: Internal Medicine

## 2022-07-03 VITALS — BP 124/62 | HR 59 | Temp 97.8°F | Ht 65.0 in | Wt 177.0 lb

## 2022-07-03 DIAGNOSIS — R079 Chest pain, unspecified: Secondary | ICD-10-CM | POA: Diagnosis not present

## 2022-07-03 DIAGNOSIS — N1831 Chronic kidney disease, stage 3a: Secondary | ICD-10-CM

## 2022-07-03 DIAGNOSIS — I10 Essential (primary) hypertension: Secondary | ICD-10-CM

## 2022-07-03 DIAGNOSIS — Z0001 Encounter for general adult medical examination with abnormal findings: Secondary | ICD-10-CM

## 2022-07-03 DIAGNOSIS — R739 Hyperglycemia, unspecified: Secondary | ICD-10-CM

## 2022-07-03 DIAGNOSIS — E7800 Pure hypercholesterolemia, unspecified: Secondary | ICD-10-CM | POA: Diagnosis not present

## 2022-07-03 DIAGNOSIS — Z23 Encounter for immunization: Secondary | ICD-10-CM

## 2022-07-03 DIAGNOSIS — E559 Vitamin D deficiency, unspecified: Secondary | ICD-10-CM

## 2022-07-03 NOTE — Assessment & Plan Note (Signed)
BP Readings from Last 3 Encounters:  07/03/22 124/62  06/24/21 118/62  01/20/21 128/72   Stable, pt to continue medical treatment losartan 50 mg qd

## 2022-07-03 NOTE — Assessment & Plan Note (Signed)
Lab Results  Component Value Date   HGBA1C 6.0 06/24/2022   Stable, pt to continue current medical treatment - diet, wt control, excercise

## 2022-07-03 NOTE — Assessment & Plan Note (Signed)
With ? Recent worsening  Lab Results  Component Value Date   CREATININE 1.60 (H) 06/24/2022  Baseline cr has been 1.3,  for increased oral fluids and repeat BMP in 3 days

## 2022-07-03 NOTE — Assessment & Plan Note (Signed)
Atypical, ECG reviewed, etiology unclear, can't r/o cardiac - for stress testing

## 2022-07-03 NOTE — Patient Instructions (Addendum)
You had the flu shot today  Please avoid undue exercise until the stress testing  Please drink more fluids in the next few days  Please go to the LAB at the blood drawing area for the tests to be done - at the [facility] lab at your convenience  You will be contacted regarding the referral for: Heart stress test (on a Monday)  Please call if you have more fast heart rates to be considered for a Cardiac Monitor for 2 weeks.  Please go to the XRAY Department in the first floor for the x-ray testing  Please continue all other medications as before, and refills have been done if requested.  Please have the pharmacy call with any other refills you may need.  Please continue your efforts at being more active, low cholesterol diet, and weight control.  You are otherwise up to date with prevention measures today.  Please keep your appointments with your specialists as you may have planned  Please make an Appointment to return in 6 months, or sooner if needed, also with Lab Appointment for testing done 3-5 days before at the [facility] (so this is for TWO appointments - please see the scheduling desk as you leave)

## 2022-07-03 NOTE — Assessment & Plan Note (Addendum)
Age and sex appropriate education and counseling updated with regular exercise and diet Referrals for preventative services - none needed Immunizations addressed - declines covid booster, for flu shot Smoking counseling  - none needed Evidence for depression or other mood disorder - none significant Most recent labs reviewed. I have personally reviewed and have noted: 1) the patient's medical and social history 2) The patient's current medications and supplements 3) The patient's height, weight, and BMI have been recorded in the chart

## 2022-07-03 NOTE — Assessment & Plan Note (Signed)
Lab Results  Component Value Date   LDLCALC 56 12/16/2020   Stable, pt to continue current statin lipitor 10 mg qd

## 2022-07-03 NOTE — Progress Notes (Signed)
Patient ID: Clifford Clos., male   DOB: 12-13-1945, 76 y.o.   MRN: [id number]         Chief Complaint:: wellness exam and Physical (Blood work concerns, occasional sob, uncomfortable feeling in chest)       HPI:  Clifford Ozment. is a 76 y.o. 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  Did have an episode of palps to 165 with just stting once time 2 mo ago, proven with home oximetry, and better after 30 min of lying down.  Does have bilateral arm tingling numbness maybe about 1 yr, very rare - 2-3 times over this time, not painful but with tingling only.  No extremity weakness.  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     Wt Readings from Last 3 Encounters:  07/03/22 177 lb (80.3 kg)  06/24/21 177 lb (80.3 kg)  01/20/21 168 lb 12.8 oz (76.6 kg)   BP Readings from Last 3 Encounters:  07/03/22 124/62  06/24/21 118/62  01/20/21 128/72   Immunization History  Administered Date(s) Administered   Fluad Quad(high Dose 65+) 06/16/2019, 06/24/2021, 07/03/2022   Influenza, High Dose Seasonal PF 04/25/2018   Influenza-Unspecified 06/22/2020   PFIZER(Purple Top)SARS-COV-2 Vaccination 10/13/2019, 11/07/2019, 07/26/2020   Pneumococcal Conjugate-13 04/16/2014   Pneumococcal Polysaccharide-23 12/24/2011   Tetanus 12/24/2011   Zoster Recombinat (Shingrix) 05/04/2017, 09/22/2017  There are no preventive care reminders to display for this patient.    Past Medical History:  Diagnosis Date   Chicken pox    Erectile dysfunction    Heart murmur    Herpes simplex    HLD (hyperlipidemia) 06/16/2019   Past Surgical History:  Procedure Laterality Date   COLONOSCOPY     15+yrs ago- normal per pt.   SPLENECTOMY      reports that he has never  smoked. He has never used smokeless tobacco. He reports current alcohol use of about 2.0 - 4.0 standard drinks of alcohol per week. He reports that he does not use drugs. family history includes Diabetes in his father; Pancreatic cancer in his mother. No Known Allergies Current Outpatient Medications on File Prior to Visit  Medication Sig Dispense Refill   atorvastatin (LIPITOR) 10 MG tablet TAKE 1 TABLET BY MOUTH DAILY 30 tablet 0   losartan (COZAAR) 50 MG tablet TAKE 1 TABLET BY MOUTH DAILY 30 tablet 0   MAGNESIUM CARBONATE PO Take 1 tablet by mouth.     meclizine (ANTIVERT) 25 MG tablet Take 1 tablet (25 mg total) by mouth 3 (three) times daily as needed for dizziness. 30 tablet 1   Melatonin 10 MG CAPS Take by mouth.     tadalafil (CIALIS) 20 MG tablet TAKE 1/2 TO 1 TABLET BY MOUTH EVERY OTHER DAY AS NEEDED FOR ERECTILE DYSFUNCTION 6 tablet 0   valACYclovir (VALTREX) 1000 MG tablet Take 2 tablets by mouth twice daily for 1 day as needed for cold sores 30 tablet 0   No current facility-administered medications on file prior to visit.        ROS:  All others reviewed and negative.  Objective  PE:  BP 124/62 (BP Location: Left Arm, Patient Position: Sitting, Cuff Size: Large)   Pulse (!) 59   Temp 97.8 F (36.6 C) (Oral)   Ht '5\' 5"'$  (1.651 m)   Wt 177 lb (80.3 kg)   SpO2 93%   BMI 29.45 kg/m                 Constitutional: Pt appears in NAD               HENT: Head: NCAT.                Right Ear: External ear normal.                 Left Ear: External ear normal.                Eyes: . Pupils are equal, round, and reactive to light. Conjunctivae and EOM are normal               Nose: without d/c or deformity               Neck: Neck supple. Gross normal ROM               Cardiovascular: Normal rate and regular rhythm.                 Pulmonary/Chest: Effort normal and breath sounds without rales or wheezing.                Abd:  Soft, NT, ND, + BS, no organomegaly                Neurological: Pt is alert. At baseline orientation, motor grossly intact               Skin: Skin is warm. No rashes, no other new lesions, LE edema - none               Psychiatric: Pt behavior is normal without agitation   Micro: none  Cardiac tracings I have personally interpreted today:  ECG - isnus bradycarida 54  Pertinent Radiological findings (summarize): none   Lab Results  Component Value Date   WBC 11.3 (H) 06/24/2022   HGB 12.3 (L) 06/24/2022   HCT 36.9 (L) 06/24/2022   PLT 388.0 06/24/2022   GLUCOSE 86 06/24/2022   CHOL 145 06/24/2022   TRIG 238.0 (H) 06/24/2022   HDL 36.90 (L) 06/24/2022   LDLDIRECT 73.0 06/24/2022   LDLCALC 56 12/16/2020   ALT 15 06/24/2022   AST 22 06/24/2022   NA 137 06/24/2022   K 4.8 06/24/2022   CL 105 06/24/2022   CREATININE 1.60 (H) 06/24/2022   BUN 32 (H) 06/24/2022   CO2 25 06/24/2022   TSH 2.35 06/24/2022   PSA 1.88 06/24/2022   INR 0.89 09/25/2009   HGBA1C 6.0 06/24/2022   MICROALBUR <0.7 12/16/2020   Assessment/Plan:  Clifford Baugh. is a 76 y.o. White or Caucasian [1] male with  has a past medical history of Chicken pox, Erectile dysfunction, Heart murmur, Herpes simplex, and HLD (hyperlipidemia) (06/16/2019).  Encounter for well adult exam with abnormal findings Age and sex appropriate education and counseling updated with regular exercise and diet Referrals for preventative services - none needed Immunizations addressed - declines covid booster Smoking counseling  - none needed Evidence for depression or other mood disorder - none significant Most recent labs reviewed. I have personally reviewed and have noted: 1) the patient's medical and  social history 2) The patient's current medications and supplements 3) The patient's height, weight, and BMI have been recorded in the chart   Hyperglycemia Lab Results  Component Value Date   HGBA1C 6.0 06/24/2022   Stable, pt to continue current medical treatment -  diet, wt control, excercise   HTN (hypertension) BP Readings from Last 3 Encounters:  07/03/22 124/62  06/24/21 118/62  01/20/21 128/72   Stable, pt to continue medical treatment losartan 50 mg qd   HLD (hyperlipidemia) Lab Results  Component Value Date   LDLCALC 56 12/16/2020   Stable, pt to continue current statin lipitor 10 mg qd   CKD (chronic kidney disease) stage 3, GFR 30-59 ml/min With ? Recent worsening  Lab Results  Component Value Date   CREATININE 1.60 (H) 06/24/2022  Baseline cr has been 1.3,  for increased oral fluids and repeat BMP in 3 days  Chest pain Atypical, ECG reviewed, etiology unclear, can't r/o cardiac - for stress testing  Followup: Return in about 6 months (around 01/01/2023).  Cathlean Cower, MD 07/03/2022 9:44 PM [facility]

## 2022-07-06 ENCOUNTER — Other Ambulatory Visit (INDEPENDENT_AMBULATORY_CARE_PROVIDER_SITE_OTHER): Payer: No Typology Code available for payment source

## 2022-07-06 ENCOUNTER — Ambulatory Visit (INDEPENDENT_AMBULATORY_CARE_PROVIDER_SITE_OTHER)
Admission: RE | Admit: 2022-07-06 | Discharge: 2022-07-06 | Disposition: A | Discharge status: Home / Self Care | Payer: No Typology Code available for payment source | Source: Ambulatory Visit | Attending: Internal Medicine | Admitting: Internal Medicine

## 2022-07-06 ENCOUNTER — Other Ambulatory Visit: Payer: No Typology Code available for payment source

## 2022-07-06 DIAGNOSIS — E559 Vitamin D deficiency, unspecified: Secondary | ICD-10-CM

## 2022-07-06 DIAGNOSIS — E7800 Pure hypercholesterolemia, unspecified: Secondary | ICD-10-CM | POA: Diagnosis not present

## 2022-07-06 DIAGNOSIS — R739 Hyperglycemia, unspecified: Secondary | ICD-10-CM | POA: Diagnosis not present

## 2022-07-06 DIAGNOSIS — R079 Chest pain, unspecified: Secondary | ICD-10-CM

## 2022-07-06 LAB — LDL CHOLESTEROL, DIRECT: Direct LDL: 55 mg/dL

## 2022-07-06 LAB — BASIC METABOLIC PANEL
BUN: 27 mg/dL — ABNORMAL HIGH (ref 6–23)
CO2: 23 mEq/L (ref 19–32)
Calcium: 9 mg/dL (ref 8.4–10.5)
Chloride: 106 mEq/L (ref 96–112)
Creatinine, Ser: 1.39 mg/dL (ref 0.40–1.50)
GFR: 49.6 mL/min — ABNORMAL LOW (ref >60.00)
Glucose, Bld: 111 mg/dL — ABNORMAL HIGH (ref 70–99)
Potassium: 4.5 mEq/L (ref 3.5–5.1)
Sodium: 137 mEq/L (ref 135–145)

## 2022-07-06 LAB — HEPATIC FUNCTION PANEL
ALT: 19 U/L (ref 0–53)
AST: 23 U/L (ref 0–37)
Albumin: 4.2 g/dL (ref 3.5–5.2)
Alkaline Phosphatase: 67 U/L (ref 39–117)
Bilirubin, Direct: 0.1 mg/dL (ref 0.0–0.3)
Total Bilirubin: 0.6 mg/dL (ref 0.2–1.2)
Total Protein: 7.2 g/dL (ref 6.0–8.3)

## 2022-07-06 LAB — LIPID PANEL
Cholesterol: 131 mg/dL (ref 0–200)
HDL: 34.7 mg/dL — ABNORMAL LOW (ref >39.00)
NonHDL: 96.07
Total CHOL/HDL Ratio: 4
Triglycerides: 254 mg/dL — ABNORMAL HIGH (ref 0.0–149.0)
VLDL: 50.8 mg/dL — ABNORMAL HIGH (ref 0.0–40.0)

## 2022-07-06 LAB — HEMOGLOBIN A1C: Hgb A1c MFr Bld: 6.1 % (ref 4.6–6.5)

## 2022-07-06 LAB — VITAMIN D 25 HYDROXY (VIT D DEFICIENCY, FRACTURES): VITD: 55.71 ng/mL (ref 30.00–100.00)

## 2022-07-16 ENCOUNTER — Encounter: Payer: Self-pay | Admitting: Internal Medicine

## 2022-07-25 ENCOUNTER — Other Ambulatory Visit: Payer: Self-pay | Admitting: Internal Medicine

## 2022-07-25 NOTE — Telephone Encounter (Signed)
Please refill as per office routine med refill policy (all routine meds to be refilled for 3 mo or monthly (per pt preference) up to one year from last visit, then month to month grace period for 3 mo, then further med refills will have to be denied) ? ?

## 2022-07-27 DIAGNOSIS — U071 COVID-19: Secondary | ICD-10-CM | POA: Diagnosis not present

## 2022-07-27 DIAGNOSIS — R059 Cough, unspecified: Secondary | ICD-10-CM | POA: Diagnosis not present

## 2022-08-06 ENCOUNTER — Encounter: Payer: Self-pay | Admitting: Internal Medicine

## 2022-08-07 ENCOUNTER — Encounter (HOSPITAL_COMMUNITY): Payer: Self-pay | Admitting: Internal Medicine

## 2022-08-29 ENCOUNTER — Other Ambulatory Visit: Payer: Self-pay | Admitting: Internal Medicine

## 2022-08-29 NOTE — Telephone Encounter (Signed)
Please refill as per office routine med refill policy (all routine meds to be refilled for 3 mo or monthly (per pt preference) up to one year from last visit, then month to month grace period for 3 mo, then further med refills will have to be denied)

## 2022-08-31 ENCOUNTER — Other Ambulatory Visit: Payer: Self-pay

## 2022-09-02 ENCOUNTER — Encounter: Payer: Self-pay | Admitting: Internal Medicine

## 2022-09-03 MED ORDER — SILDENAFIL CITRATE 100 MG PO TABS: 50.0000 mg | ORAL_TABLET | Freq: Every day | ORAL | 11 refills | Status: DC | PRN

## 2022-09-03 NOTE — Telephone Encounter (Signed)
LOV 11/17, patient is requesting a change from Cialis to Viagra due to heartburn if possible.

## 2022-09-23 ENCOUNTER — Other Ambulatory Visit: Payer: Self-pay

## 2022-09-23 MED ORDER — LOSARTAN POTASSIUM 50 MG PO TABS: 50.0000 mg | ORAL_TABLET | Freq: Every day | ORAL | 0 refills | Status: DC

## 2022-09-25 ENCOUNTER — Other Ambulatory Visit: Payer: Self-pay | Admitting: Internal Medicine

## 2022-09-25 ENCOUNTER — Telehealth: Payer: Self-pay | Admitting: Internal Medicine

## 2022-09-25 ENCOUNTER — Other Ambulatory Visit: Payer: Self-pay

## 2022-09-25 MED ORDER — ATORVASTATIN CALCIUM 10 MG PO TABS: 10.0000 mg | ORAL_TABLET | Freq: Every day | ORAL | 2 refills | Status: DC

## 2022-09-25 MED ORDER — ATORVASTATIN CALCIUM 10 MG PO TABS: 10.0000 mg | ORAL_TABLET | Freq: Every day | ORAL | 0 refills | Status: DC

## 2022-09-25 MED ORDER — LOSARTAN POTASSIUM 50 MG PO TABS: 50.0000 mg | ORAL_TABLET | Freq: Every day | ORAL | 2 refills | Status: DC

## 2022-09-25 NOTE — Telephone Encounter (Signed)
Pt request for medication refill is send

## 2022-09-25 NOTE — Telephone Encounter (Signed)
Caller & Relationship to patient:  [employer] back number:   Date of last office visit:   Date of next office visit:   Medication(s) to be refilled:  Atorvastatin        [facility]

## 2022-12-18 ENCOUNTER — Other Ambulatory Visit: Payer: Self-pay

## 2022-12-29 DIAGNOSIS — Z20822 Contact with and (suspected) exposure to covid-19: Secondary | ICD-10-CM | POA: Diagnosis not present

## 2022-12-29 DIAGNOSIS — J069 Acute upper respiratory infection, unspecified: Secondary | ICD-10-CM | POA: Diagnosis not present

## 2022-12-30 ENCOUNTER — Telehealth: Payer: Self-pay | Admitting: Internal Medicine

## 2022-12-30 DIAGNOSIS — E7800 Pure hypercholesterolemia, unspecified: Secondary | ICD-10-CM

## 2022-12-30 DIAGNOSIS — R739 Hyperglycemia, unspecified: Secondary | ICD-10-CM

## 2022-12-30 DIAGNOSIS — E559 Vitamin D deficiency, unspecified: Secondary | ICD-10-CM

## 2022-12-30 DIAGNOSIS — Z125 Encounter for screening for malignant neoplasm of prostate: Secondary | ICD-10-CM

## 2022-12-30 DIAGNOSIS — E538 Deficiency of other specified B group vitamins: Secondary | ICD-10-CM

## 2022-12-30 NOTE — Telephone Encounter (Signed)
Called Pt and let him know! 

## 2022-12-30 NOTE — Telephone Encounter (Signed)
Pt called wanting to get blood work done before his appt on 5/20. Please advised.

## 2022-12-30 NOTE — Telephone Encounter (Signed)
Contacted Clifford Gould. to schedule their annual wellness visit. Appointment made for 01/15/2023.  [facility] Care Guide [facility] AWV TEAM Direct Dial: [phone]

## 2022-12-30 NOTE — Telephone Encounter (Signed)
Ok labs are ordered 

## 2023-01-01 ENCOUNTER — Other Ambulatory Visit: Payer: No Typology Code available for payment source

## 2023-01-01 DIAGNOSIS — Z125 Encounter for screening for malignant neoplasm of prostate: Secondary | ICD-10-CM

## 2023-01-01 DIAGNOSIS — E538 Deficiency of other specified B group vitamins: Secondary | ICD-10-CM | POA: Diagnosis not present

## 2023-01-01 DIAGNOSIS — E7800 Pure hypercholesterolemia, unspecified: Secondary | ICD-10-CM | POA: Diagnosis not present

## 2023-01-01 DIAGNOSIS — R739 Hyperglycemia, unspecified: Secondary | ICD-10-CM

## 2023-01-01 DIAGNOSIS — E559 Vitamin D deficiency, unspecified: Secondary | ICD-10-CM

## 2023-01-01 LAB — VITAMIN D 25 HYDROXY (VIT D DEFICIENCY, FRACTURES): VITD: 38.4 ng/mL (ref 30.00–100.00)

## 2023-01-01 LAB — BASIC METABOLIC PANEL
BUN: 31 mg/dL — ABNORMAL HIGH (ref 6–23)
CO2: 26 mEq/L (ref 19–32)
Calcium: 9.1 mg/dL (ref 8.4–10.5)
Chloride: 105 mEq/L (ref 96–112)
Creatinine, Ser: 1.89 mg/dL — ABNORMAL HIGH (ref 0.40–1.50)
GFR: 34.18 mL/min — ABNORMAL LOW (ref >60.00)
Glucose, Bld: 95 mg/dL (ref 70–99)
Potassium: 5.2 mEq/L — ABNORMAL HIGH (ref 3.5–5.1)
Sodium: 138 mEq/L (ref 135–145)

## 2023-01-01 LAB — URINALYSIS, ROUTINE W REFLEX MICROSCOPIC
Hgb urine dipstick: NEGATIVE
Leukocytes,Ua: NEGATIVE
Nitrite: NEGATIVE
Specific Gravity, Urine: 1.025 (ref 1.000–1.030)
Total Protein, Urine: NEGATIVE
Urine Glucose: NEGATIVE
Urobilinogen, UA: 0.2 (ref 0.0–1.0)
pH: 5.5 (ref 5.0–8.0)

## 2023-01-01 LAB — CBC WITH DIFFERENTIAL/PLATELET
Basophils Absolute: 0.1 10*3/uL (ref 0.0–0.1)
Basophils Relative: 0.4 % (ref 0.0–3.0)
Eosinophils Absolute: 0.8 10*3/uL — ABNORMAL HIGH (ref 0.0–0.7)
Eosinophils Relative: 5.6 % — ABNORMAL HIGH (ref 0.0–5.0)
HCT: 34.5 % — ABNORMAL LOW (ref 39.0–52.0)
Hemoglobin: 11.6 g/dL — ABNORMAL LOW (ref 13.0–17.0)
Lymphocytes Relative: 18.7 % (ref 12.0–46.0)
Lymphs Abs: 2.6 10*3/uL (ref 0.7–4.0)
MCHC: 33.6 g/dL (ref 30.0–36.0)
MCV: 98.1 fl (ref 78.0–100.0)
Monocytes Absolute: 1.2 10*3/uL — ABNORMAL HIGH (ref 0.1–1.0)
Monocytes Relative: 8.4 % (ref 3.0–12.0)
Neutro Abs: 9.3 10*3/uL — ABNORMAL HIGH (ref 1.4–7.7)
Neutrophils Relative %: 66.9 % (ref 43.0–77.0)
Platelets: 411 10*3/uL — ABNORMAL HIGH (ref 150.0–400.0)
RBC: 3.51 Mil/uL — ABNORMAL LOW (ref 4.22–5.81)
RDW: 12.6 % (ref 11.5–15.5)
WBC: 14 10*3/uL — ABNORMAL HIGH (ref 4.0–10.5)

## 2023-01-01 LAB — LIPID PANEL
Cholesterol: 111 mg/dL (ref 0–200)
HDL: 34.6 mg/dL — ABNORMAL LOW (ref >39.00)
LDL Cholesterol: 46 mg/dL (ref 0–99)
NonHDL: 76.24
Total CHOL/HDL Ratio: 3
Triglycerides: 152 mg/dL — ABNORMAL HIGH (ref 0.0–149.0)
VLDL: 30.4 mg/dL (ref 0.0–40.0)

## 2023-01-01 LAB — HEPATIC FUNCTION PANEL
ALT: 149 U/L — ABNORMAL HIGH (ref 0–53)
AST: 129 U/L — ABNORMAL HIGH (ref 0–37)
Albumin: 3.9 g/dL (ref 3.5–5.2)
Alkaline Phosphatase: 263 U/L — ABNORMAL HIGH (ref 39–117)
Bilirubin, Direct: 0.2 mg/dL (ref 0.0–0.3)
Total Bilirubin: 0.6 mg/dL (ref 0.2–1.2)
Total Protein: 6.7 g/dL (ref 6.0–8.3)

## 2023-01-01 LAB — MICROALBUMIN / CREATININE URINE RATIO
Creatinine,U: 203 mg/dL
Microalb Creat Ratio: 0.7 mg/g (ref 0.0–30.0)
Microalb, Ur: 1.4 mg/dL (ref 0.0–1.9)

## 2023-01-01 LAB — HEMOGLOBIN A1C: Hgb A1c MFr Bld: 6.2 % (ref 4.6–6.5)

## 2023-01-01 LAB — TSH: TSH: 1.91 u[IU]/mL (ref 0.35–5.50)

## 2023-01-01 LAB — VITAMIN B12: Vitamin B-12: 442 pg/mL (ref 211–911)

## 2023-01-01 LAB — PSA: PSA: 1.82 ng/mL (ref 0.10–4.00)

## 2023-01-04 ENCOUNTER — Ambulatory Visit: Payer: No Typology Code available for payment source | Admitting: Internal Medicine

## 2023-01-04 ENCOUNTER — Ambulatory Visit (INDEPENDENT_AMBULATORY_CARE_PROVIDER_SITE_OTHER): Payer: No Typology Code available for payment source

## 2023-01-04 ENCOUNTER — Encounter: Payer: Self-pay | Admitting: Internal Medicine

## 2023-01-04 VITALS — BP 148/92 | HR 70 | Temp 98.9°F | Ht 65.0 in | Wt 173.0 lb

## 2023-01-04 DIAGNOSIS — D72829 Elevated white blood cell count, unspecified: Secondary | ICD-10-CM | POA: Diagnosis not present

## 2023-01-04 DIAGNOSIS — R7989 Other specified abnormal findings of blood chemistry: Secondary | ICD-10-CM

## 2023-01-04 DIAGNOSIS — I10 Essential (primary) hypertension: Secondary | ICD-10-CM | POA: Diagnosis not present

## 2023-01-04 DIAGNOSIS — R052 Subacute cough: Secondary | ICD-10-CM | POA: Insufficient documentation

## 2023-01-04 DIAGNOSIS — N1831 Chronic kidney disease, stage 3a: Secondary | ICD-10-CM

## 2023-01-04 DIAGNOSIS — E7800 Pure hypercholesterolemia, unspecified: Secondary | ICD-10-CM

## 2023-01-04 DIAGNOSIS — Z0001 Encounter for general adult medical examination with abnormal findings: Secondary | ICD-10-CM | POA: Diagnosis not present

## 2023-01-04 DIAGNOSIS — R1013 Epigastric pain: Secondary | ICD-10-CM | POA: Diagnosis not present

## 2023-01-04 DIAGNOSIS — D649 Anemia, unspecified: Secondary | ICD-10-CM

## 2023-01-04 DIAGNOSIS — R059 Cough, unspecified: Secondary | ICD-10-CM | POA: Diagnosis not present

## 2023-01-04 MED ORDER — PANTOPRAZOLE SODIUM 40 MG PO TBEC: 40.0000 mg | DELAYED_RELEASE_TABLET | Freq: Every day | ORAL | 3 refills | Status: DC

## 2023-01-04 NOTE — Patient Instructions (Addendum)
Please have your Tdap tetanus shot done at the pharmacy.    Please take OTC Vitamin D3 at 2000 units per day, indefinitely  Please take all new medication as prescribed   - the protonix 40 mg per day  Please continue all other medications as before, and refills have been done if requested.  Please have the pharmacy call with any other refills you may need.  Please continue your efforts at being more active, low cholesterol diet, and weight control.  You are otherwise up to date with prevention measures today.  Please keep your appointments with your specialists as you may have planned  Please go to the XRAY Department in the first floor for the x-ray testing - today  Please go to the LAB at the blood drawing area for the tests to be done in 1 wk per your suggestion  You will be contacted by phone if any changes need to be made immediately.  Otherwise, you will receive a letter about your results with an explanation, but please check with MyChart first.  Please remember to sign up for MyChart if you have not done so, as this will be important to you in the future with finding out test results, communicating by private email, and scheduling acute appointments online when needed.  Please make an Appointment to return in 6 months, or sooner if needed

## 2023-01-04 NOTE — Assessment & Plan Note (Signed)
With mild AKI on CKD likely due to decreased po intake, for abd u/s r/o obstruction

## 2023-01-04 NOTE — Assessment & Plan Note (Signed)
Lab Results  Component Value Date   LDLCALC 46 01/01/2023   Stable, pt to continue current statin lipitor 10 mg qd

## 2023-01-04 NOTE — Assessment & Plan Note (Signed)
C/w likely gastritis by hx, unclear how related to abnormal LFTs, for protonix 40 qd and consider GI referral for persistent or worsneing

## 2023-01-04 NOTE — Progress Notes (Signed)
Patient ID: Clifford Brazil., male   DOB: Feb 27, 1946, 77 y.o.   MRN: [id number]         Chief Complaint:: wellness exam and Medical Management of Chronic Issues (6 month follow up , been having a lot indigestion and been having coughing for the past , and pain in middle abdomen after eating , also been having mucus and dry cough for the past month )  , abnormal lfts, AKI on CKD, leukocytosis, elevated BP HTN       HPI:  Clifford Gould. is a 77 y.o. male here for wellness exam; for tdap at pharmacy , o/w up to date                        Also did have mild elevated BP last wk at [facility] with 1 mo mild intermittent cough and ST, mild prod at times.  No hx of significant allergies, cough persists.  Did not have fever, still works, neg for covid and strep, also some unusually tired after working full time regular hours,  Works as Publishing rights manager.  No sick contacts. 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  Has had midl to mod indigestion it seems, and epigastric pain for unclear reason,  Has had reduced appetite and less fluids somewhat as well.  Water even makes indigestion worse, did seem improved with pepcid and alka seltzer .  Gets burning sensation at times by itself that last for several minutes and resolves, off and on for no clear.  Pt denies chest pain, increased sob or doe, wheezing, orthopnea, PND, increased LE swelling, palpitations, dizziness or syncop except for chest pains with coughing.   Pt denies polydipsia, polyuria, or new focal neuro s/s.    Pt denies fever, wt loss, night sweats, loss of appetite, or other constitutional symptoms    Wt Readings from Last 3 Encounters:  01/04/23 173 lb (78.5 kg)  07/03/22 177 lb (80.3 kg)  06/24/21 177 lb (80.3 kg)   BP Readings from Last 3 Encounters:  01/04/23 (!) 148/92  07/03/22 124/62  06/24/21 118/62   Immunization History  Administered Date(s) Administered   Fluad Quad(high Dose 65+)  06/16/2019, 06/24/2021, 07/03/2022   Influenza, High Dose Seasonal PF 04/25/2018   Influenza-Unspecified 06/22/2020   PFIZER(Purple Top)SARS-COV-2 Vaccination 10/13/2019, 11/07/2019, 07/26/2020   Pneumococcal Conjugate-13 04/16/2014   Pneumococcal Polysaccharide-23 12/24/2011   Tetanus 12/24/2011   Zoster Recombinat (Shingrix) 05/04/2017, 09/22/2017   Health Maintenance Due  Topic Date Due   DTaP/Tdap/Td (1 - Tdap) 12/25/2011   Medicare Annual Wellness (AWV)  01/09/2023      Past Medical History:  Diagnosis Date   Chicken pox    Erectile dysfunction    Heart murmur    Herpes simplex    HLD (hyperlipidemia) 06/16/2019   Past Surgical History:  Procedure Laterality Date   COLONOSCOPY     15+yrs ago- normal per pt.   SPLENECTOMY      reports that he has never smoked. He has never used smokeless tobacco. He reports current alcohol use of about 2.0 - 4.0 standard drinks of alcohol per week. He reports that he does not use drugs. family history includes Diabetes in his father; Pancreatic cancer in his mother. No Known Allergies Current Outpatient Medications on File Prior to Visit  Medication Sig Dispense Refill   atorvastatin (LIPITOR) 10 MG tablet Take 1 tablet (10 mg total) by mouth  daily. 90 tablet 2   losartan (COZAAR) 50 MG tablet Take 1 tablet (50 mg total) by mouth daily. 90 tablet 2   MAGNESIUM CARBONATE PO Take 1 tablet by mouth.     meclizine (ANTIVERT) 25 MG tablet Take 1 tablet (25 mg total) by mouth 3 (three) times daily as needed for dizziness. 30 tablet 1   sildenafil (VIAGRA) 100 MG tablet Take 0.5-1 tablets (50-100 mg total) by mouth daily as needed for erectile dysfunction. 10 tablet 11   valACYclovir (VALTREX) 1000 MG tablet Take 2 tablets by mouth twice daily for 1 day as needed for cold sores 30 tablet 0   No current facility-administered medications on file prior to visit.        ROS:  All others reviewed and negative.  Objective        PE:  BP (!)  148/92 (BP Location: Right Arm, Patient Position: Sitting, Cuff Size: Normal)   Pulse 70   Temp 98.9 F (37.2 C) (Oral)   Ht 5\' 5"  (1.651 m)   Wt 173 lb (78.5 kg)   SpO2 97%   BMI 28.79 kg/m                 Constitutional: Pt appears in NAD               HENT: Head: NCAT.                Right Ear: External ear normal.                 Left Ear: External ear normal.                Eyes: . Pupils are equal, round, and reactive to light. Conjunctivae and EOM are normal               Nose: without d/c or deformity               Neck: Neck supple. Gross normal ROM               Cardiovascular: Normal rate and regular rhythm.                 Pulmonary/Chest: Effort normal and breath sounds without rales or wheezing.                Abd:  Soft, NT, ND, + BS, no organomegaly               Neurological: Pt is alert. At baseline orientation, motor grossly intact               Skin: Skin is warm. No rashes, no other new lesions, LE edema - none               Psychiatric: Pt behavior is normal without agitation   Micro: none  Cardiac tracings I have personally interpreted today:  none  Pertinent Radiological findings (summarize): none   Lab Results  Component Value Date   WBC 14.0 (H) 01/01/2023   HGB 11.6 (L) 01/01/2023   HCT 34.5 (L) 01/01/2023   PLT 411.0 (H) 01/01/2023   GLUCOSE 95 01/01/2023   CHOL 111 01/01/2023   TRIG 152.0 (H) 01/01/2023   HDL 34.60 (L) 01/01/2023   LDLDIRECT 55.0 07/06/2022   LDLCALC 46 01/01/2023   ALT 149 (H) 01/01/2023   AST 129 (H) 01/01/2023   NA 138 01/01/2023   K 5.2 No hemolysis seen (H) 01/01/2023   CL 105  01/01/2023   CREATININE 1.89 (H) 01/01/2023   BUN 31 (H) 01/01/2023   CO2 26 01/01/2023   TSH 1.91 01/01/2023   PSA 1.82 01/01/2023   INR 0.89 09/25/2009   HGBA1C 6.2 01/01/2023   MICROALBUR 1.4 01/01/2023   Assessment/Plan:  Clifford Burki. is a 77 y.o. White or Caucasian [1] male with  has a past medical history of Chicken pox,  Erectile dysfunction, Heart murmur, Herpes simplex, and HLD (hyperlipidemia) (06/16/2019).  Encounter for well adult exam with abnormal findings Age and sex appropriate education and counseling updated with regular exercise and diet Referrals for preventative services - none needed Immunizations addressed - for shingrix at pharmacy Smoking counseling  - none needed Evidence for depression or other mood disorder - none significant Most recent labs reviewed. I have personally reviewed and have noted: 1) the patient's medical and social history 2) The patient's current medications and supplements 3) The patient's height, weight, and BMI have been recorded in the chart   HLD (hyperlipidemia) Lab Results  Component Value Date   LDLCALC 46 01/01/2023   Stable, pt to continue current statin lipitor 10 mg qd   CKD (chronic kidney disease) stage 3, GFR 30-59 ml/min With mild AKI on CKD likely due to decreased po intake, for abd u/s r/o obstruction  HTN (hypertension) Mild elevated today, possible situational, pt declines change in tx at this time  Abnormal LFTs Etiology unclear, for cxr given recent cough, abd u/s, and acute hep profile,f/u Lfts with next draw, and consider referral GI  Leukocytosis Has mild chronic elevated now slightly worsened over baseline, possibly related to recent cough and presumed viral or other infection, cont to f/u with cbc next lab draw  Anemia Mild worsening over baseline, also for iron lab with next lab draw  Epigastric pain C/w likely gastritis by hx, unclear how related to abnormal LFTs, for protonix 40 qd and consider GI referral for persistent or worsneing  Subacute cough For 1 wk early in the month, then better, then worse again, home COVId neg during the past wk, can't r/o covid infection as could explain abnormal Lfts as well, for cxr  Followup: Return in about 6 months (around 07/07/2023).  Oliver Barre, MD 01/04/2023 8:47 PM [place]  [facility]

## 2023-01-04 NOTE — Assessment & Plan Note (Addendum)
Etiology unclear, for cxr given recent cough, abd u/s, and acute hep profile,f/u Lfts with next draw, and consider referral GI

## 2023-01-04 NOTE — Assessment & Plan Note (Signed)
Has mild chronic elevated now slightly worsened over baseline, possibly related to recent cough and presumed viral or other infection, cont to f/u with cbc next lab draw

## 2023-01-04 NOTE — Assessment & Plan Note (Signed)

## 2023-01-04 NOTE — Assessment & Plan Note (Signed)
Mild worsening over baseline, also for iron lab with next lab draw

## 2023-01-04 NOTE — Assessment & Plan Note (Signed)
Mild elevated today, possible situational, pt declines change in tx at this time

## 2023-01-04 NOTE — Assessment & Plan Note (Signed)
For 1 wk early in the month, then better, then worse again, home COVId neg during the past wk, can't r/o covid infection as could explain abnormal Lfts as well, for cxr

## 2023-01-06 DIAGNOSIS — L578 Other skin changes due to chronic exposure to nonionizing radiation: Secondary | ICD-10-CM | POA: Diagnosis not present

## 2023-01-06 DIAGNOSIS — Z85828 Personal history of other malignant neoplasm of skin: Secondary | ICD-10-CM | POA: Diagnosis not present

## 2023-01-06 DIAGNOSIS — Z08 Encounter for follow-up examination after completed treatment for malignant neoplasm: Secondary | ICD-10-CM | POA: Diagnosis not present

## 2023-01-06 DIAGNOSIS — L821 Other seborrheic keratosis: Secondary | ICD-10-CM | POA: Diagnosis not present

## 2023-01-06 DIAGNOSIS — L814 Other melanin hyperpigmentation: Secondary | ICD-10-CM | POA: Diagnosis not present

## 2023-01-06 DIAGNOSIS — L570 Actinic keratosis: Secondary | ICD-10-CM | POA: Diagnosis not present

## 2023-01-06 DIAGNOSIS — R229 Localized swelling, mass and lump, unspecified: Secondary | ICD-10-CM | POA: Diagnosis not present

## 2023-01-06 DIAGNOSIS — C44321 Squamous cell carcinoma of skin of nose: Secondary | ICD-10-CM | POA: Diagnosis not present

## 2023-01-06 LAB — HEPATIC FUNCTION PANEL
ALT: 52 U/L (ref 0–53)
AST: 29 U/L (ref 0–37)
Albumin: 4.2 g/dL (ref 3.5–5.2)
Alkaline Phosphatase: 254 U/L — ABNORMAL HIGH (ref 39–117)
Bilirubin, Direct: 0.1 mg/dL (ref 0.0–0.3)
Total Bilirubin: 0.4 mg/dL (ref 0.2–1.2)
Total Protein: 7.7 g/dL (ref 6.0–8.3)

## 2023-01-06 LAB — CBC WITH DIFFERENTIAL/PLATELET
Basophils Absolute: 0.1 10*3/uL (ref 0.0–0.1)
Basophils Relative: 0.8 % (ref 0.0–3.0)
Eosinophils Absolute: 0.5 10*3/uL (ref 0.0–0.7)
Eosinophils Relative: 4.2 % (ref 0.0–5.0)
HCT: 35.4 % — ABNORMAL LOW (ref 39.0–52.0)
Hemoglobin: 11.6 g/dL — ABNORMAL LOW (ref 13.0–17.0)
Lymphocytes Relative: 36 % (ref 12.0–46.0)
Lymphs Abs: 4.5 10*3/uL — ABNORMAL HIGH (ref 0.7–4.0)
MCHC: 32.7 g/dL (ref 30.0–36.0)
MCV: 98.8 fl (ref 78.0–100.0)
Monocytes Absolute: 1.4 10*3/uL — ABNORMAL HIGH (ref 0.1–1.0)
Monocytes Relative: 11.1 % (ref 3.0–12.0)
Neutro Abs: 6 10*3/uL (ref 1.4–7.7)
Neutrophils Relative %: 47.9 % (ref 43.0–77.0)
Platelets: 540 10*3/uL — ABNORMAL HIGH (ref 150.0–400.0)
RBC: 3.58 Mil/uL — ABNORMAL LOW (ref 4.22–5.81)
RDW: 12.4 % (ref 11.5–15.5)
WBC: 12.6 10*3/uL — ABNORMAL HIGH (ref 4.0–10.5)

## 2023-01-06 LAB — BASIC METABOLIC PANEL
BUN: 32 mg/dL — ABNORMAL HIGH (ref 6–23)
CO2: 25 mEq/L (ref 19–32)
Calcium: 9.6 mg/dL (ref 8.4–10.5)
Chloride: 104 mEq/L (ref 96–112)
Creatinine, Ser: 1.48 mg/dL (ref 0.40–1.50)
GFR: 45.84 mL/min — ABNORMAL LOW (ref >60.00)
Glucose, Bld: 93 mg/dL (ref 70–99)
Potassium: 5.1 mEq/L (ref 3.5–5.1)
Sodium: 137 mEq/L (ref 135–145)

## 2023-01-06 LAB — IBC PANEL
Iron: 95 ug/dL (ref 42–165)
Saturation Ratios: 26.8 % (ref 20.0–50.0)
TIBC: 354.2 ug/dL (ref 250.0–450.0)
Transferrin: 253 mg/dL (ref 212.0–360.0)

## 2023-01-06 LAB — FERRITIN: Ferritin: 287.6 ng/mL (ref 22.0–322.0)

## 2023-01-07 LAB — HEPATITIS PANEL, ACUTE
Hep A IgM: NONREACTIVE
Hep B C IgM: NONREACTIVE
Hepatitis B Surface Ag: NONREACTIVE
Hepatitis C Ab: NONREACTIVE

## 2023-01-07 LAB — H. PYLORI ANTIBODY, IGG: H Pylori IgG: NEGATIVE

## 2023-01-21 DIAGNOSIS — H5213 Myopia, bilateral: Secondary | ICD-10-CM | POA: Diagnosis not present

## 2023-01-25 DIAGNOSIS — H52223 Regular astigmatism, bilateral: Secondary | ICD-10-CM | POA: Diagnosis not present

## 2023-01-25 DIAGNOSIS — H5213 Myopia, bilateral: Secondary | ICD-10-CM | POA: Diagnosis not present

## 2023-01-27 ENCOUNTER — Ambulatory Visit: Payer: No Typology Code available for payment source

## 2023-01-27 VITALS — Ht 65.0 in | Wt 168.0 lb

## 2023-01-27 DIAGNOSIS — Z0000 Encounter for general adult medical examination without abnormal findings: Secondary | ICD-10-CM

## 2023-01-27 NOTE — Patient Instructions (Addendum)
[name] , Thank you for taking time to come for your Medicare Wellness Visit. I appreciate your ongoing commitment to your health goals. Please review the following plan we discussed and let me know if I can assist you in the future.   These are the goals we discussed:  Goals      My goal is to make it to 2025, lose about 5-10 pounds and get better physically fit.        This is a list of the screening recommended for you and due dates:  Health Maintenance  Topic Date Due   Flu Shot  03/18/2023   Medicare Annual Wellness Visit  01/27/2024   DTaP/Tdap/Td vaccine (2 - Td or Tdap) 01/03/2033   Hepatitis C Screening  Completed   Zoster (Shingles) Vaccine  Completed   HPV Vaccine  Aged Out   Pneumonia Vaccine  Discontinued   Colon Cancer Screening  Discontinued   COVID-19 Vaccine  Discontinued    Advanced directives: Yes  Conditions/risks identified: Yes  Next appointment: Follow up in one year for your annual wellness visit via telephone call with Nurse Katerine Morua on 02/02/2024 at 4:00 pm.  If you need to cancel or reschedule please call [phone].  Preventive Care 44 Years and Older, Male  Preventive care refers to lifestyle choices and visits with your health care provider that can promote health and wellness. What does preventive care include? A yearly physical exam. This is also called an annual well check. Dental exams once or twice a year. Routine eye exams. Ask your health care provider how often you should have your eyes checked. Personal lifestyle choices, including: Daily care of your teeth and gums. Regular physical activity. Eating a healthy diet. Avoiding tobacco and drug use. Limiting alcohol use. Practicing safe sex. Taking low doses of aspirin every day. Taking vitamin and mineral supplements as recommended by your health care provider. What happens during an annual well check? The services and screenings done by your health care provider during your  annual well check will depend on your age, overall health, lifestyle risk factors, and family history of disease. Counseling  Your health care provider may ask you questions about your: Alcohol use. Tobacco use. Drug use. Emotional well-being. Home and relationship well-being. Sexual activity. Eating habits. History of falls. Memory and ability to understand (cognition). Work and work Astronomer. Screening  You may have the following tests or measurements: Height, weight, and BMI. Blood pressure. Lipid and cholesterol levels. These may be checked every 5 years, or more frequently if you are over 44 years old. Skin check. Lung cancer screening. You may have this screening every year starting at age 73 if you have a 30-pack-year history of smoking and currently smoke or have quit within the past 15 years. Fecal occult blood test (FOBT) of the stool. You may have this test every year starting at age 68. Flexible sigmoidoscopy or colonoscopy. You may have a sigmoidoscopy every 5 years or a colonoscopy every 10 years starting at age 87. Prostate cancer screening. Recommendations will vary depending on your family history and other risks. Hepatitis C blood test. Hepatitis B blood test. Sexually transmitted disease (STD) testing. Diabetes screening. This is done by checking your blood sugar (glucose) after you have not eaten for a while (fasting). You may have this done every 1-3 years. Abdominal aortic aneurysm (AAA) screening. You may need this if you are a current or former smoker. Osteoporosis. You may be screened starting at age  70 if you are at high risk. Talk with your health care provider about your test results, treatment options, and if necessary, the need for more tests. Vaccines  Your health care provider may recommend certain vaccines, such as: Influenza vaccine. This is recommended every year. Tetanus, diphtheria, and acellular pertussis (Tdap, Td) vaccine. You may need a Td  booster every 10 years. Zoster vaccine. You may need this after age 49. Pneumococcal 13-valent conjugate (PCV13) vaccine. One dose is recommended after age 40. Pneumococcal polysaccharide (PPSV23) vaccine. One dose is recommended after age 16. Talk to your health care provider about which screenings and vaccines you need and how often you need them. This information is not intended to replace advice given to you by your health care provider. Make sure you discuss any questions you have with your health care provider. Document Released: 08/30/2015 Document Revised: 04/22/2016 Document Reviewed: 06/04/2015 Elsevier Interactive Patient Education  2017 [place] Prevention in the Home Falls can cause injuries. They can happen to people of all ages. There are many things you can do to make your home safe and to help prevent falls. What can I do on the outside of my home? Regularly fix the edges of walkways and driveways and fix any cracks. Remove anything that might make you trip as you walk through a door, such as a raised step or threshold. Trim any bushes or trees on the path to your home. Use bright outdoor lighting. Clear any walking paths of anything that might make someone trip, such as rocks or tools. Regularly check to see if handrails are loose or broken. Make sure that both sides of any steps have handrails. Any raised decks and porches should have guardrails on the edges. Have any leaves, snow, or ice cleared regularly. Use sand or salt on walking paths during winter. Clean up any spills in your garage right away. This includes oil or grease spills. What can I do in the bathroom? Use night lights. Install grab bars by the toilet and in the tub and shower. Do not use towel bars as grab bars. Use non-skid mats or decals in the tub or shower. If you need to sit down in the shower, use a plastic, non-slip stool. Keep the floor dry. Clean up any water that spills on the floor  as soon as it happens. Remove soap buildup in the tub or shower regularly. Attach bath mats securely with double-sided non-slip rug tape. Do not have throw rugs and other things on the floor that can make you trip. What can I do in the bedroom? Use night lights. Make sure that you have a light by your bed that is easy to reach. Do not use any sheets or blankets that are too big for your bed. They should not hang down onto the floor. Have a firm chair that has side arms. You can use this for support while you get dressed. Do not have throw rugs and other things on the floor that can make you trip. What can I do in the kitchen? Clean up any spills right away. Avoid walking on wet floors. Keep items that you use a lot in easy-to-reach places. If you need to reach something above you, use a strong step stool that has a grab bar. Keep electrical cords out of the way. Do not use floor polish or wax that makes floors slippery. If you must use wax, use non-skid floor wax. Do not have throw rugs and other  things on the floor that can make you trip. What can I do with my stairs? Do not leave any items on the stairs. Make sure that there are handrails on both sides of the stairs and use them. Fix handrails that are broken or loose. Make sure that handrails are as long as the stairways. Check any carpeting to make sure that it is firmly attached to the stairs. Fix any carpet that is loose or worn. Avoid having throw rugs at the top or bottom of the stairs. If you do have throw rugs, attach them to the floor with carpet tape. Make sure that you have a light switch at the top of the stairs and the bottom of the stairs. If you do not have them, ask someone to add them for you. What else can I do to help prevent falls? Wear shoes that: Do not have high heels. Have rubber bottoms. Are comfortable and fit you well. Are closed at the toe. Do not wear sandals. If you use a stepladder: Make sure that it is  fully opened. Do not climb a closed stepladder. Make sure that both sides of the stepladder are locked into place. Ask someone to hold it for you, if possible. Clearly mark and make sure that you can see: Any grab bars or handrails. First and last steps. Where the edge of each step is. Use tools that help you move around (mobility aids) if they are needed. These include: Canes. Walkers. Scooters. Crutches. Turn on the lights when you go into a dark area. Replace any light bulbs as soon as they burn out. Set up your furniture so you have a clear path. Avoid moving your furniture around. If any of your floors are uneven, fix them. If there are any pets around you, be aware of where they are. Review your medicines with your doctor. Some medicines can make you feel dizzy. This can increase your chance of falling. Ask your doctor what other things that you can do to help prevent falls. This information is not intended to replace advice given to you by your health care provider. Make sure you discuss any questions you have with your health care provider. Document Released: 05/30/2009 Document Revised: 01/09/2016 Document Reviewed: 09/07/2014 Elsevier Interactive Patient Education  2017 [employer].

## 2023-01-27 NOTE — Progress Notes (Signed)
I connected with  Clifford Gould. on 01/27/23 by a audio enabled telemedicine application and verified that I am speaking with the correct person using two identifiers.  Patient Location: Home  Provider Location: Office/Clinic  I discussed the limitations of evaluation and management by telemedicine. The patient expressed understanding and agreed to proceed.  Subjective:   Clifford Gould. is a 77 y.o. male who presents for Medicare Annual/Subsequent preventive examination.  Review of Systems     Cardiac Risk Factors include: advanced age (>29men, >77 women);dyslipidemia;family history of premature cardiovascular disease;hypertension;male gender     Objective:    Today's Vitals   01/27/23 1604  Weight: 168 lb (76.2 kg)  Height: 5\' 5"  (1.651 m)  PainSc: 3   PainLoc: Knee   Body mass index is 27.96 kg/m.     01/27/2023    4:06 PM 01/08/2022   11:00 AM 01/07/2021    4:26 PM 12/11/2019    8:41 AM 10/24/2018    8:32 AM  Advanced Directives  Does Patient Have a Medical Advance Directive? Yes Yes No Yes No  Type of Estate agent of [place];Living will Living will;Healthcare Power of [employer] Power of [place];Living will   Does patient want to make changes to medical advance directive?  No - Patient declined  No - Patient declined   Copy of Healthcare Power of Attorney in Chart? No - copy requested No - copy requested  No - copy requested   Would patient like information on creating a medical advance directive?   No - Patient declined  No - Patient declined    Current Medications (verified) Outpatient Encounter Medications as of 01/27/2023  Medication Sig   atorvastatin (LIPITOR) 10 MG tablet Take 1 tablet (10 mg total) by mouth daily.   losartan (COZAAR) 50 MG tablet Take 1 tablet (50 mg total) by mouth daily.   MAGNESIUM CARBONATE PO Take 1 tablet by mouth.   meclizine (ANTIVERT) 25 MG tablet Take 1 tablet (25 mg total) by mouth 3  (three) times daily as needed for dizziness.   pantoprazole (PROTONIX) 40 MG tablet Take 1 tablet (40 mg total) by mouth daily.   sildenafil (VIAGRA) 100 MG tablet Take 0.5-1 tablets (50-100 mg total) by mouth daily as needed for erectile dysfunction.   valACYclovir (VALTREX) 1000 MG tablet Take 2 tablets by mouth twice daily for 1 day as needed for cold sores   No facility-administered encounter medications on file as of 01/27/2023.    Allergies (verified) Patient has no known allergies.   History: Past Medical History:  Diagnosis Date   Chicken pox    Erectile dysfunction    Heart murmur    Herpes simplex    HLD (hyperlipidemia) 06/16/2019   Past Surgical History:  Procedure Laterality Date   COLONOSCOPY     15+yrs ago- normal per pt.   SPLENECTOMY     Family History  Problem Relation Age of Onset   Pancreatic cancer Mother        pancreatic   Diabetes Father    Colon cancer Neg Hx    Social History   Socioeconomic History   Marital status: Widowed    Spouse name: Not on file   Number of children: 3   Years of education: 16   Highest education level: Bachelor's degree (e.g., BA, AB, BS)  Occupational History   Occupation: Nurse Aide   Tobacco Use   Smoking status: Never   Smokeless tobacco: Never  Vaping Use   Vaping Use: Never used  Substance and Sexual Activity   Alcohol use: Yes    Alcohol/week: 2.0 - 4.0 standard drinks of alcohol    Types: 2 - 4 Cans of beer per week    Comment: occ beer   Drug use: No   Sexual activity: Never  Other Topics Concern   Not on file  Social History Narrative   Born and raised in [place], [place]. Currently resides in a house with his wife. 2 dogs (1 boxer, half-lab/pitbull). Fun: Play golf, tennis and read.    Denies religious beliefs that would effect health care.    Social Determinants of Health   Financial Resource Strain: Low Risk  (01/27/2023)   Overall Financial Resource Strain (CARDIA)    Difficulty of Paying Living  Expenses: Not hard at all  Food Insecurity: No Food Insecurity (01/27/2023)   Hunger Vital Sign    Worried About Running Out of Food in the Last Year: Never true    Ran Out of Food in the Last Year: Never true  Transportation Needs: No Transportation Needs (01/27/2023)   PRAPARE - Administrator, Civil Service (Medical): No    Lack of Transportation (Non-Medical): No  Physical Activity: Sufficiently Active (01/27/2023)   Exercise Vital Sign    Days of Exercise per Week: 5 days    Minutes of Exercise per Session: 40 min  Stress: No Stress Concern Present (01/27/2023)   [employer] of Occupational Health - Occupational Stress Questionnaire    Feeling of Stress : Not at all  Social Connections: Moderately Isolated (01/27/2023)   Social Connection and Isolation Panel [NHANES]    Frequency of Communication with Friends and Family: More than three times a week    Frequency of Social Gatherings with Friends and Family: Three times a week    Attends Religious Services: Never    Active Member of Clubs or Organizations: Yes    Attends Banker Meetings: More than 4 times per year    Marital Status: Widowed    Tobacco Counseling Counseling given: Not Answered   Clinical Intake:  Pre-visit preparation completed: Yes  Pain : 0-10 Pain Score: 3  Pain Orientation: Left, Right     BMI - recorded: 27.96 Nutritional Risks: None Diabetes: No  How often do you need to have someone help you when you read instructions, pamphlets, or other written materials from your doctor or pharmacy?: 1 - Never What is the last grade level you completed in school?: HSG  Diabetic? no  Interpreter Needed?: No  Information entered by :: Jontue Crumpacker N. Cyndie Woodbeck, LPN.   Activities of Daily Living    01/27/2023    4:23 PM  In your present state of health, do you have any difficulty performing the following activities:  Hearing? 0  Vision? 0  Difficulty concentrating or making  decisions? 0  Walking or climbing stairs? 0  Dressing or bathing? 0  Doing errands, shopping? 0  Preparing Food and eating ? N  Using the Toilet? N  In the past six months, have you accidently leaked urine? N  Do you have problems with loss of bowel control? N  Managing your Medications? N  Managing your Finances? N  Housekeeping or managing your Housekeeping? N    Patient Care Team: Corwin Levins, MD as PCP - General (Internal Medicine) Ander Purpura, OD (Optometry) Glennis Brink, MD as Referring Physician (Dermatology)  Indicate any recent Medical Services you may have  received from other than [facility] providers in the past year (date may be approximate).     Assessment:   This is a routine wellness examination for [place].  Hearing/Vision screen Hearing Screening - Comments:: Patient denied any hearing difficulty.   No hearing aids.  Vision Screening - Comments:: Patient does wear corrective lenses/contacts.  Eye exam done by: Ander Purpura, OD.   Dietary issues and exercise activities discussed: Current Exercise Habits: Home exercise routine, Type of exercise: walking, Time (Minutes): 40, Frequency (Times/Week): 5, Weekly Exercise (Minutes/Week): 200, Intensity: Moderate, Exercise limited by: orthopedic condition(s)   Goals Addressed             This Visit's Progress    My goal is to make it to 2025, lose about 5-10 pounds and get better physically fit.        Depression Screen    01/27/2023    4:22 PM 01/04/2023    8:13 AM 07/03/2022    8:58 AM 07/03/2022    8:42 AM 01/08/2022   10:59 AM 06/24/2021    1:15 PM 06/24/2021    1:05 PM  PHQ 2/9 Scores  PHQ - 2 Score 0 0 0 0 0 0 0  PHQ- 9 Score 2 2  3        Fall Risk    01/27/2023    4:07 PM 01/04/2023    8:13 AM 07/03/2022    8:57 AM 07/03/2022    8:43 AM 01/08/2022   11:01 AM  Fall Risk   Falls in the past year? 0 0 1 1 0  Number falls in past yr: 0 0 1 1 0  Injury with Fall? 0 0 0 1 0  Risk for fall due to : No  Fall Risks No Fall Risks  Impaired balance/gait No Fall Risks  Follow up Falls prevention discussed Falls evaluation completed  Falls evaluation completed Falls evaluation completed    FALL RISK PREVENTION PERTAINING TO THE HOME:  Any stairs in or around the home? Yes  to attic only If so, are there any without handrails? No  Home free of loose throw rugs in walkways, pet beds, electrical cords, etc? Yes  Adequate lighting in your home to reduce risk of falls? Yes   ASSISTIVE DEVICES UTILIZED TO PREVENT FALLS:  Life alert? No  Use of a cane, walker or w/c? No  Grab bars in the bathroom? Yes  Shower chair or bench in shower? No  Elevated toilet seat or a handicapped toilet? No   TIMED UP AND GO:  Was the test performed? No . Telephonic Visit  Cognitive Function:        01/27/2023    4:08 PM 01/08/2022   11:02 AM  6CIT Screen  What Year? 0 points 0 points  What month? 0 points 0 points  What time? 0 points 0 points  Count back from 20 0 points 0 points  Months in reverse 0 points 0 points  Repeat phrase 0 points 0 points  Total Score 0 points 0 points    Immunizations Immunization History  Administered Date(s) Administered   Fluad Quad(high Dose 65+) 06/16/2019, 06/24/2021, 07/03/2022   Influenza, High Dose Seasonal PF 04/25/2018   Influenza-Unspecified 06/22/2020   PFIZER(Purple Top)SARS-COV-2 Vaccination 10/13/2019, 11/07/2019, 07/26/2020   Pneumococcal Conjugate-13 04/16/2014   Pneumococcal Polysaccharide-23 12/24/2011   Tdap 01/04/2023   Tetanus 12/24/2011   Zoster Recombinat (Shingrix) 05/04/2017, 09/22/2017    TDAP status: Up to date  Flu Vaccine status: Up to date  Pneumococcal vaccine status: Up to date  Covid-19 vaccine status: Completed vaccines  Qualifies for Shingles Vaccine? Yes   Zostavax completed No   Shingrix Completed?: Yes  Screening Tests Health Maintenance  Topic Date Due   INFLUENZA VACCINE  03/18/2023   Medicare Annual Wellness  (AWV)  01/27/2024   DTaP/Tdap/Td (2 - Td or Tdap) 01/03/2033   Hepatitis C Screening  Completed   Zoster Vaccines- Shingrix  Completed   HPV VACCINES  Aged Out   Pneumonia Vaccine 51+ Years old  Discontinued   Colonoscopy  Discontinued   COVID-19 Vaccine  Discontinued    Health Maintenance  There are no preventive care reminders to display for this patient.   Colorectal cancer screening: Type of screening: Colonoscopy. Completed 04/25/2013. Repeat every 10 years  Lung Cancer Screening: (Low Dose CT Chest recommended if Age 40-80 years, 30 pack-year currently smoking OR have quit w/in 15years.) does not qualify.   Lung Cancer Screening Referral: no  Additional Screening:  Hepatitis C Screening: does qualify; Completed 01/06/2023  Vision Screening: Recommended annual ophthalmology exams for early detection of glaucoma and other disorders of the eye. Is the patient up to date with their annual eye exam?  Yes  Who is the provider or what is the name of the office in which the patient attends annual eye exams? Ander Purpura, OD. If pt is not established with a provider, would they like to be referred to a provider to establish care? No .   Dental Screening: Recommended annual dental exams for proper oral hygiene  Community Resource Referral / Chronic Care Management: CRR required this visit?  No   CCM required this visit?  No      Plan:     I have personally reviewed and noted the following in the patient's chart:   Medical and social history Use of alcohol, tobacco or illicit drugs  Current medications and supplements including opioid prescriptions. Patient is not currently taking opioid prescriptions. Functional ability and status Nutritional status Physical activity Advanced directives List of other physicians Hospitalizations, surgeries, and ER visits in previous 12 months Vitals Screenings to include cognitive, depression, and falls Referrals and appointments  In  addition, I have reviewed and discussed with patient certain preventive protocols, quality metrics, and best practice recommendations. A written personalized care plan for preventive services as well as general preventive health recommendations were provided to patient.     Mickeal Needy, LPN   [id number]/[id number]   Nurse Notes: Normal cognitive status assessed by direct observation via telephone conversation by this Nurse Health Advisor. No abnormalities found.

## 2023-03-16 ENCOUNTER — Encounter: Payer: Self-pay | Admitting: Internal Medicine

## 2023-03-17 MED ORDER — LOSARTAN POTASSIUM 100 MG PO TABS: 100.0000 mg | ORAL_TABLET | Freq: Every day | ORAL | 3 refills | Status: DC

## 2023-03-17 NOTE — Telephone Encounter (Signed)
Ok to increase the losartan to 100 mg every day,  Please start checking bp at home about 5 days after this change, and let us know if your BP at home is consistently > 140/90 (or some cardiologists would say 130/80)  thanks

## 2023-04-02 ENCOUNTER — Encounter: Payer: Self-pay | Admitting: Internal Medicine

## 2023-06-30 ENCOUNTER — Encounter: Payer: Self-pay | Admitting: Internal Medicine

## 2023-06-30 ENCOUNTER — Other Ambulatory Visit: Payer: No Typology Code available for payment source

## 2023-06-30 DIAGNOSIS — E559 Vitamin D deficiency, unspecified: Secondary | ICD-10-CM

## 2023-06-30 DIAGNOSIS — E538 Deficiency of other specified B group vitamins: Secondary | ICD-10-CM

## 2023-06-30 DIAGNOSIS — Z77011 Contact with and (suspected) exposure to lead: Secondary | ICD-10-CM

## 2023-06-30 DIAGNOSIS — E7800 Pure hypercholesterolemia, unspecified: Secondary | ICD-10-CM

## 2023-06-30 DIAGNOSIS — R739 Hyperglycemia, unspecified: Secondary | ICD-10-CM

## 2023-06-30 DIAGNOSIS — D649 Anemia, unspecified: Secondary | ICD-10-CM | POA: Diagnosis not present

## 2023-06-30 DIAGNOSIS — N1831 Chronic kidney disease, stage 3a: Secondary | ICD-10-CM

## 2023-06-30 LAB — VITAMIN D 25 HYDROXY (VIT D DEFICIENCY, FRACTURES): VITD: 41.12 ng/mL (ref 30.00–100.00)

## 2023-06-30 LAB — CBC WITH DIFFERENTIAL/PLATELET
Basophils Absolute: 0.3 10*3/uL — ABNORMAL HIGH (ref 0.0–0.1)
Basophils Relative: 2.4 % (ref 0.0–3.0)
Eosinophils Absolute: 0.3 10*3/uL (ref 0.0–0.7)
Eosinophils Relative: 2.6 % (ref 0.0–5.0)
HCT: 33.6 % — ABNORMAL LOW (ref 39.0–52.0)
Hemoglobin: 11.6 g/dL — ABNORMAL LOW (ref 13.0–17.0)
Lymphocytes Relative: 47.8 % — ABNORMAL HIGH (ref 12.0–46.0)
Lymphs Abs: 5.8 10*3/uL — ABNORMAL HIGH (ref 0.7–4.0)
MCHC: 34.5 g/dL (ref 30.0–36.0)
MCV: 99.7 fL (ref 78.0–100.0)
Monocytes Absolute: 1.1 10*3/uL — ABNORMAL HIGH (ref 0.1–1.0)
Monocytes Relative: 9.1 % (ref 3.0–12.0)
Neutro Abs: 4.6 10*3/uL (ref 1.4–7.7)
Neutrophils Relative %: 38.1 % — ABNORMAL LOW (ref 43.0–77.0)
Platelets: 395 10*3/uL (ref 150.0–400.0)
RBC: 3.37 Mil/uL — ABNORMAL LOW (ref 4.22–5.81)
RDW: 13.1 % (ref 11.5–15.5)
WBC: 12.1 10*3/uL — ABNORMAL HIGH (ref 4.0–10.5)

## 2023-06-30 LAB — LIPID PANEL
Cholesterol: 127 mg/dL (ref 0–200)
HDL: 30.5 mg/dL — ABNORMAL LOW (ref >39.00)
Total CHOL/HDL Ratio: 4
Triglycerides: 422 mg/dL — ABNORMAL HIGH (ref 0.0–149.0)

## 2023-06-30 LAB — BASIC METABOLIC PANEL
BUN: 27 mg/dL — ABNORMAL HIGH (ref 6–23)
CO2: 26 meq/L (ref 19–32)
Calcium: 9.1 mg/dL (ref 8.4–10.5)
Chloride: 106 meq/L (ref 96–112)
Creatinine, Ser: 1.63 mg/dL — ABNORMAL HIGH (ref 0.40–1.50)
GFR: 40.69 mL/min — ABNORMAL LOW (ref >60.00)
Glucose, Bld: 94 mg/dL (ref 70–99)
Potassium: 4.9 meq/L (ref 3.5–5.1)
Sodium: 138 meq/L (ref 135–145)

## 2023-06-30 LAB — HEPATIC FUNCTION PANEL
ALT: 19 U/L (ref 0–53)
AST: 23 U/L (ref 0–37)
Albumin: 4.2 g/dL (ref 3.5–5.2)
Alkaline Phosphatase: 65 U/L (ref 39–117)
Bilirubin, Direct: 0.1 mg/dL (ref 0.0–0.3)
Total Bilirubin: 0.6 mg/dL (ref 0.2–1.2)
Total Protein: 7 g/dL (ref 6.0–8.3)

## 2023-06-30 LAB — VITAMIN B12: Vitamin B-12: 365 pg/mL (ref 211–911)

## 2023-06-30 LAB — IBC PANEL
Iron: 135 ug/dL (ref 42–165)
Saturation Ratios: 34 % (ref 20.0–50.0)
TIBC: 397.6 ug/dL (ref 250.0–450.0)
Transferrin: 284 mg/dL (ref 212.0–360.0)

## 2023-06-30 LAB — LDL CHOLESTEROL, DIRECT: Direct LDL: 53 mg/dL

## 2023-06-30 LAB — HEMOGLOBIN A1C: Hgb A1c MFr Bld: 6.2 % (ref 4.6–6.5)

## 2023-06-30 LAB — FERRITIN: Ferritin: 136.3 ng/mL (ref 22.0–322.0)

## 2023-07-05 ENCOUNTER — Ambulatory Visit (INDEPENDENT_AMBULATORY_CARE_PROVIDER_SITE_OTHER): Payer: No Typology Code available for payment source | Admitting: Internal Medicine

## 2023-07-05 ENCOUNTER — Encounter: Payer: Self-pay | Admitting: Internal Medicine

## 2023-07-05 VITALS — BP 126/78 | HR 55 | Temp 98.3°F | Ht 65.0 in | Wt 172.0 lb

## 2023-07-05 DIAGNOSIS — I10 Essential (primary) hypertension: Secondary | ICD-10-CM | POA: Diagnosis not present

## 2023-07-05 DIAGNOSIS — M5431 Sciatica, right side: Secondary | ICD-10-CM

## 2023-07-05 DIAGNOSIS — E538 Deficiency of other specified B group vitamins: Secondary | ICD-10-CM | POA: Diagnosis not present

## 2023-07-05 DIAGNOSIS — N1831 Chronic kidney disease, stage 3a: Secondary | ICD-10-CM | POA: Diagnosis not present

## 2023-07-05 DIAGNOSIS — E559 Vitamin D deficiency, unspecified: Secondary | ICD-10-CM

## 2023-07-05 DIAGNOSIS — R739 Hyperglycemia, unspecified: Secondary | ICD-10-CM

## 2023-07-05 DIAGNOSIS — R9431 Abnormal electrocardiogram [ECG] [EKG]: Secondary | ICD-10-CM

## 2023-07-05 DIAGNOSIS — Z125 Encounter for screening for malignant neoplasm of prostate: Secondary | ICD-10-CM

## 2023-07-05 DIAGNOSIS — E7800 Pure hypercholesterolemia, unspecified: Secondary | ICD-10-CM

## 2023-07-05 NOTE — Progress Notes (Unsigned)
Patient ID: Clifford Brazil., male   DOB: Jul 07, 1946, 77 y.o.   MRN: [id number]        Chief Complaint: follow up HTN, HLD and right sciatica, ckd3a       HPI:  Clifford Pour. is a 77 y.o. male here overall doing ok, Pt denies chest pain, increased sob or doe, wheezing, orthopnea, PND, increased LE swelling, palpitations, dizziness or syncope.   Pt denies polydipsia, polyuria, or new focal neuro s/s.    Pt denies fever, wt loss, night sweats, loss of appetite, or other constitutional symptoms  Pt continues to have recurring LBP with right sciatica mild intermittent without change in severity, bowel or bladder change, fever, wt loss,  worsening LE pain/numbness/weakness, gait change or falls.         Wt Readings from Last 3 Encounters:  07/05/23 172 lb (78 kg)  01/27/23 168 lb (76.2 kg)  01/04/23 173 lb (78.5 kg)   BP Readings from Last 3 Encounters:  07/05/23 126/78  01/04/23 (!) 148/92  07/03/22 124/62         Past Medical History:  Diagnosis Date   Chicken pox    Erectile dysfunction    Heart murmur    Herpes simplex    HLD (hyperlipidemia) 06/16/2019   Past Surgical History:  Procedure Laterality Date   COLONOSCOPY     15+yrs ago- normal per pt.   SPLENECTOMY      reports that he has never smoked. He has never used smokeless tobacco. He reports current alcohol use of about 2.0 - 4.0 standard drinks of alcohol per week. He reports that he does not use drugs. family history includes Diabetes in his father; Pancreatic cancer in his mother. No Known Allergies Current Outpatient Medications on File Prior to Visit  Medication Sig Dispense Refill   atorvastatin (LIPITOR) 10 MG tablet Take 1 tablet (10 mg total) by mouth daily. 90 tablet 2   fluorouracil (EFUDEX) 5 % cream Apply 1 Application topically 2 (two) times daily.     losartan (COZAAR) 100 MG tablet Take 1 tablet (100 mg total) by mouth daily. 90 tablet 3   MAGNESIUM CARBONATE PO Take 1 tablet by mouth.      meclizine (ANTIVERT) 25 MG tablet Take 1 tablet (25 mg total) by mouth 3 (three) times daily as needed for dizziness. 30 tablet 1   pantoprazole (PROTONIX) 40 MG tablet Take 1 tablet (40 mg total) by mouth daily. 90 tablet 3   sildenafil (VIAGRA) 100 MG tablet Take 0.5-1 tablets (50-100 mg total) by mouth daily as needed for erectile dysfunction. 10 tablet 11   valACYclovir (VALTREX) 1000 MG tablet Take 2 tablets by mouth twice daily for 1 day as needed for cold sores 30 tablet 0   No current facility-administered medications on file prior to visit.        ROS:  All others reviewed and negative.  Objective        PE:  BP 126/78 (BP Location: Right Arm, Patient Position: Sitting, Cuff Size: Normal)   Pulse (!) 55   Temp 98.3 F (36.8 C) (Oral)   Ht 5\' 5"  (1.651 m)   Wt 172 lb (78 kg)   SpO2 95%   BMI 28.62 kg/m                 Constitutional: Pt appears in NAD               HENT: Head: NCAT.  Right Ear: External ear normal.                 Left Ear: External ear normal.                Eyes: . Pupils are equal, round, and reactive to light. Conjunctivae and EOM are normal               Nose: without d/c or deformity               Neck: Neck supple. Gross normal ROM               Cardiovascular: Normal rate and regular rhythm.                 Pulmonary/Chest: Effort normal and breath sounds without rales or wheezing.                Abd:  Soft, NT, ND, + BS, no organomegaly               Neurological: Pt is alert. At baseline orientation, motor grossly intact               Skin: Skin is warm. No rashes, no other new lesions, LE edema - none               Psychiatric: Pt behavior is normal without agitation   Micro: none  Cardiac tracings I have personally interpreted today:  none  Pertinent Radiological findings (summarize): none   Lab Results  Component Value Date   WBC 12.1 (H) 06/30/2023   HGB 11.6 (L) 06/30/2023   HCT 33.6 (L) 06/30/2023   PLT 395.0  06/30/2023   GLUCOSE 94 06/30/2023   CHOL 127 06/30/2023   TRIG (H) 06/30/2023    422.0 Triglyceride is over 400; calculations on Lipids are invalid.   HDL 30.50 (L) 06/30/2023   LDLDIRECT 53.0 06/30/2023   LDLCALC 46 01/01/2023   ALT 19 06/30/2023   AST 23 06/30/2023   NA 138 06/30/2023   K 4.9 06/30/2023   CL 106 06/30/2023   CREATININE 1.63 (H) 06/30/2023   BUN 27 (H) 06/30/2023   CO2 26 06/30/2023   TSH 1.91 01/01/2023   PSA 1.82 01/01/2023   INR 0.89 09/25/2009   HGBA1C 6.2 06/30/2023   MICROALBUR 1.4 01/01/2023   Assessment/Plan:  Clifford Reibel. is a 77 y.o. White or Caucasian [1] male with  has a past medical history of Chicken pox, Erectile dysfunction, Heart murmur, Herpes simplex, and HLD (hyperlipidemia) (06/16/2019).  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  Oliver Barre, MD 07/06/2023 9:16 PM [facility] - [facility]

## 2023-07-05 NOTE — Patient Instructions (Addendum)
Please call if you would want the colonoscopy or cologuard for screening  Please continue all other medications as before, and refills have been done if requested.  Please have the pharmacy call with any other refills you may need.  Please continue your efforts at being more active, low cholesterol diet, and weight control  Please keep your appointments with your specialists as you may have planned  You will be contacted regarding the referral for: Cardiac CT Score  Please make an Appointment to return in 6 months, or sooner if needed, also with Lab Appointment for testing done 3-5 days before at the FIRST FLOOR Lab (so this is for TWO appointments - please see the scheduling desk as you leave)

## 2023-07-06 DIAGNOSIS — M5431 Sciatica, right side: Secondary | ICD-10-CM | POA: Insufficient documentation

## 2023-07-06 DIAGNOSIS — M5416 Radiculopathy, lumbar region: Secondary | ICD-10-CM | POA: Insufficient documentation

## 2023-07-06 NOTE — Assessment & Plan Note (Addendum)
Recent onset mild, declines imaging for now, to f/u any worsening symptoms or concerns

## 2023-07-06 NOTE — Assessment & Plan Note (Addendum)
Lab Results  Component Value Date   LDLCALC 46 01/01/2023   Stable, pt to continue current statin lipitor 10 every day, also for card ct score

## 2023-07-06 NOTE — Assessment & Plan Note (Signed)
Lab Results  Component Value Date   CREATININE 1.63 (H) 06/30/2023   Stable overall, cont to avoid nephrotoxins

## 2023-07-06 NOTE — Assessment & Plan Note (Signed)
BP Readings from Last 3 Encounters:  07/05/23 126/78  01/04/23 (!) 148/92  07/03/22 124/62   Stable, pt to continue medical treatment losartan 100 qd

## 2023-07-08 ENCOUNTER — Other Ambulatory Visit: Payer: No Typology Code available for payment source

## 2023-07-08 DIAGNOSIS — E559 Vitamin D deficiency, unspecified: Secondary | ICD-10-CM

## 2023-07-08 DIAGNOSIS — E7800 Pure hypercholesterolemia, unspecified: Secondary | ICD-10-CM | POA: Diagnosis not present

## 2023-07-08 DIAGNOSIS — R739 Hyperglycemia, unspecified: Secondary | ICD-10-CM | POA: Diagnosis not present

## 2023-07-08 DIAGNOSIS — Z125 Encounter for screening for malignant neoplasm of prostate: Secondary | ICD-10-CM | POA: Diagnosis not present

## 2023-07-08 DIAGNOSIS — E538 Deficiency of other specified B group vitamins: Secondary | ICD-10-CM

## 2023-07-08 LAB — BASIC METABOLIC PANEL
BUN: 30 mg/dL — ABNORMAL HIGH (ref 6–23)
CO2: 27 meq/L (ref 19–32)
Calcium: 9.4 mg/dL (ref 8.4–10.5)
Chloride: 106 meq/L (ref 96–112)
Creatinine, Ser: 1.6 mg/dL — ABNORMAL HIGH (ref 0.40–1.50)
GFR: 41.6 mL/min — ABNORMAL LOW (ref >60.00)
Glucose, Bld: 91 mg/dL (ref 70–99)
Potassium: 5.1 meq/L (ref 3.5–5.1)
Sodium: 138 meq/L (ref 135–145)

## 2023-07-08 LAB — LIPID PANEL
Cholesterol: 123 mg/dL (ref 0–200)
HDL: 28.8 mg/dL — ABNORMAL LOW (ref >39.00)
LDL Cholesterol: 41 mg/dL (ref 0–99)
NonHDL: 94.18
Total CHOL/HDL Ratio: 4
Triglycerides: 266 mg/dL — ABNORMAL HIGH (ref 0.0–149.0)
VLDL: 53.2 mg/dL — ABNORMAL HIGH (ref 0.0–40.0)

## 2023-07-08 LAB — CBC WITH DIFFERENTIAL/PLATELET
Basophils Absolute: 0.1 10*3/uL (ref 0.0–0.1)
Basophils Relative: 0.6 % (ref 0.0–3.0)
Eosinophils Absolute: 0.4 10*3/uL (ref 0.0–0.7)
Eosinophils Relative: 3 % (ref 0.0–5.0)
HCT: 35.4 % — ABNORMAL LOW (ref 39.0–52.0)
Hemoglobin: 11.7 g/dL — ABNORMAL LOW (ref 13.0–17.0)
Lymphocytes Relative: 47.6 % — ABNORMAL HIGH (ref 12.0–46.0)
Lymphs Abs: 5.6 10*3/uL — ABNORMAL HIGH (ref 0.7–4.0)
MCHC: 33 g/dL (ref 30.0–36.0)
MCV: 101.3 fL — ABNORMAL HIGH (ref 78.0–100.0)
Monocytes Absolute: 1 10*3/uL (ref 0.1–1.0)
Monocytes Relative: 8.6 % (ref 3.0–12.0)
Neutro Abs: 4.7 10*3/uL (ref 1.4–7.7)
Neutrophils Relative %: 40.2 % — ABNORMAL LOW (ref 43.0–77.0)
Platelets: 430 10*3/uL — ABNORMAL HIGH (ref 150.0–400.0)
RBC: 3.49 Mil/uL — ABNORMAL LOW (ref 4.22–5.81)
RDW: 13 % (ref 11.5–15.5)
WBC: 11.8 10*3/uL — ABNORMAL HIGH (ref 4.0–10.5)

## 2023-07-08 LAB — URINALYSIS, ROUTINE W REFLEX MICROSCOPIC
Bilirubin Urine: NEGATIVE
Hgb urine dipstick: NEGATIVE
Ketones, ur: NEGATIVE
Leukocytes,Ua: NEGATIVE
Nitrite: NEGATIVE
RBC / HPF: NONE SEEN (ref 0–?)
Specific Gravity, Urine: 1.025 (ref 1.000–1.030)
Total Protein, Urine: NEGATIVE
Urine Glucose: NEGATIVE
Urobilinogen, UA: 0.2 (ref 0.0–1.0)
pH: 6 (ref 5.0–8.0)

## 2023-07-08 LAB — HEPATIC FUNCTION PANEL
ALT: 16 U/L (ref 0–53)
AST: 21 U/L (ref 0–37)
Albumin: 4.3 g/dL (ref 3.5–5.2)
Alkaline Phosphatase: 66 U/L (ref 39–117)
Bilirubin, Direct: 0.1 mg/dL (ref 0.0–0.3)
Total Bilirubin: 0.4 mg/dL (ref 0.2–1.2)
Total Protein: 6.8 g/dL (ref 6.0–8.3)

## 2023-07-08 LAB — HEMOGLOBIN A1C: Hgb A1c MFr Bld: 6.2 % (ref 4.6–6.5)

## 2023-07-08 LAB — TSH: TSH: 1.81 u[IU]/mL (ref 0.35–5.50)

## 2023-07-08 LAB — VITAMIN B12: Vitamin B-12: 270 pg/mL (ref 211–911)

## 2023-07-08 LAB — MICROALBUMIN / CREATININE URINE RATIO
Creatinine,U: 96.2 mg/dL
Microalb Creat Ratio: 0.7 mg/g (ref 0.0–30.0)
Microalb, Ur: 0.7 mg/dL (ref 0.0–1.9)

## 2023-07-08 LAB — PSA: PSA: 2.12 ng/mL (ref 0.10–4.00)

## 2023-07-08 LAB — VITAMIN D 25 HYDROXY (VIT D DEFICIENCY, FRACTURES): VITD: 37.02 ng/mL (ref 30.00–100.00)

## 2023-07-08 NOTE — Progress Notes (Signed)
The test results show that your current treatment is OK, as the tests are stable.  Please continue the same plan.  There is no other need for change of treatment or further evaluation based on these results, at this time.  thanks 

## 2023-07-30 ENCOUNTER — Other Ambulatory Visit: Payer: Self-pay

## 2023-07-30 ENCOUNTER — Other Ambulatory Visit: Payer: Self-pay | Admitting: Internal Medicine

## 2023-08-27 ENCOUNTER — Ambulatory Visit (HOSPITAL_BASED_OUTPATIENT_CLINIC_OR_DEPARTMENT_OTHER)
Admission: RE | Admit: 2023-08-27 | Discharge: 2023-08-27 | Disposition: A | Discharge status: Home / Self Care | Payer: Self-pay | Source: Ambulatory Visit | Attending: Internal Medicine | Admitting: Internal Medicine

## 2023-08-27 ENCOUNTER — Encounter: Payer: Self-pay | Admitting: Internal Medicine

## 2023-08-27 ENCOUNTER — Other Ambulatory Visit: Payer: Self-pay | Admitting: Internal Medicine

## 2023-08-27 DIAGNOSIS — R931 Abnormal findings on diagnostic imaging of heart and coronary circulation: Secondary | ICD-10-CM | POA: Insufficient documentation

## 2023-08-27 DIAGNOSIS — I10 Essential (primary) hypertension: Secondary | ICD-10-CM

## 2023-08-27 DIAGNOSIS — E7800 Pure hypercholesterolemia, unspecified: Secondary | ICD-10-CM

## 2023-08-27 DIAGNOSIS — R9431 Abnormal electrocardiogram [ECG] [EKG]: Secondary | ICD-10-CM

## 2023-08-27 DIAGNOSIS — R739 Hyperglycemia, unspecified: Secondary | ICD-10-CM

## 2023-08-27 MED ORDER — ASPIRIN 81 MG PO TBEC: 81.0000 mg | DELAYED_RELEASE_TABLET | Freq: Every day | ORAL | Status: AC

## 2023-09-02 ENCOUNTER — Encounter: Payer: Self-pay | Admitting: Internal Medicine

## 2023-09-09 ENCOUNTER — Encounter: Payer: Self-pay | Admitting: Internal Medicine

## 2023-09-27 DIAGNOSIS — M545 Low back pain, unspecified: Secondary | ICD-10-CM | POA: Diagnosis not present

## 2023-10-07 DIAGNOSIS — M51369 Other intervertebral disc degeneration, lumbar region without mention of lumbar back pain or lower extremity pain: Secondary | ICD-10-CM | POA: Insufficient documentation

## 2023-10-08 DIAGNOSIS — M545 Low back pain, unspecified: Secondary | ICD-10-CM | POA: Diagnosis not present

## 2023-10-08 DIAGNOSIS — M51369 Other intervertebral disc degeneration, lumbar region without mention of lumbar back pain or lower extremity pain: Secondary | ICD-10-CM | POA: Diagnosis not present

## 2023-10-18 ENCOUNTER — Emergency Department (HOSPITAL_COMMUNITY)
Admission: EM | Admit: 2023-10-18 | Discharge: 2023-10-18 | Disposition: A | Discharge status: Home / Self Care | Attending: Emergency Medicine | Admitting: Emergency Medicine

## 2023-10-18 ENCOUNTER — Emergency Department (HOSPITAL_COMMUNITY)

## 2023-10-18 ENCOUNTER — Other Ambulatory Visit: Payer: Self-pay

## 2023-10-18 ENCOUNTER — Encounter (HOSPITAL_COMMUNITY): Payer: Self-pay | Admitting: *Deleted

## 2023-10-18 ENCOUNTER — Encounter (HOSPITAL_BASED_OUTPATIENT_CLINIC_OR_DEPARTMENT_OTHER): Payer: Self-pay | Admitting: Emergency Medicine

## 2023-10-18 DIAGNOSIS — Z79899 Other long term (current) drug therapy: Secondary | ICD-10-CM | POA: Diagnosis not present

## 2023-10-18 DIAGNOSIS — M5431 Sciatica, right side: Secondary | ICD-10-CM | POA: Diagnosis not present

## 2023-10-18 DIAGNOSIS — Z7982 Long term (current) use of aspirin: Secondary | ICD-10-CM | POA: Insufficient documentation

## 2023-10-18 DIAGNOSIS — N189 Chronic kidney disease, unspecified: Secondary | ICD-10-CM | POA: Insufficient documentation

## 2023-10-18 DIAGNOSIS — M549 Dorsalgia, unspecified: Secondary | ICD-10-CM | POA: Diagnosis present

## 2023-10-18 DIAGNOSIS — M4805 Spinal stenosis, thoracolumbar region: Secondary | ICD-10-CM | POA: Diagnosis not present

## 2023-10-18 DIAGNOSIS — M5125 Other intervertebral disc displacement, thoracolumbar region: Secondary | ICD-10-CM | POA: Diagnosis not present

## 2023-10-18 DIAGNOSIS — M5441 Lumbago with sciatica, right side: Secondary | ICD-10-CM | POA: Diagnosis not present

## 2023-10-18 DIAGNOSIS — M4807 Spinal stenosis, lumbosacral region: Secondary | ICD-10-CM | POA: Diagnosis not present

## 2023-10-18 DIAGNOSIS — I129 Hypertensive chronic kidney disease with stage 1 through stage 4 chronic kidney disease, or unspecified chronic kidney disease: Secondary | ICD-10-CM | POA: Diagnosis not present

## 2023-10-18 DIAGNOSIS — M48061 Spinal stenosis, lumbar region without neurogenic claudication: Secondary | ICD-10-CM | POA: Diagnosis not present

## 2023-10-18 LAB — BASIC METABOLIC PANEL
Anion gap: 11 (ref 5–15)
BUN: 38 mg/dL — ABNORMAL HIGH (ref 8–23)
CO2: 20 mmol/L — ABNORMAL LOW (ref 22–32)
Calcium: 9.5 mg/dL (ref 8.9–10.3)
Chloride: 107 mmol/L (ref 98–111)
Creatinine, Ser: 1.91 mg/dL — ABNORMAL HIGH (ref 0.61–1.24)
GFR, Estimated: 36 mL/min — ABNORMAL LOW (ref >60)
Glucose, Bld: 106 mg/dL — ABNORMAL HIGH (ref 70–99)
Potassium: 5.3 mmol/L — ABNORMAL HIGH (ref 3.5–5.1)
Sodium: 138 mmol/L (ref 135–145)

## 2023-10-18 LAB — CBC WITH DIFFERENTIAL/PLATELET
Abs Immature Granulocytes: 0.02 10*3/uL (ref 0.00–0.07)
Basophils Absolute: 0.1 10*3/uL (ref 0.0–0.1)
Basophils Relative: 1 %
Eosinophils Absolute: 0.2 10*3/uL (ref 0.0–0.5)
Eosinophils Relative: 3 %
HCT: 37.3 % — ABNORMAL LOW (ref 39.0–52.0)
Hemoglobin: 12.6 g/dL — ABNORMAL LOW (ref 13.0–17.0)
Immature Granulocytes: 0 %
Lymphocytes Relative: 43 %
Lymphs Abs: 3.8 10*3/uL (ref 0.7–4.0)
MCH: 33.7 pg (ref 26.0–34.0)
MCHC: 33.8 g/dL (ref 30.0–36.0)
MCV: 99.7 fL (ref 80.0–100.0)
Monocytes Absolute: 0.8 10*3/uL (ref 0.1–1.0)
Monocytes Relative: 9 %
Neutro Abs: 4 10*3/uL (ref 1.7–7.7)
Neutrophils Relative %: 44 %
Platelets: 435 10*3/uL — ABNORMAL HIGH (ref 150–400)
RBC: 3.74 MIL/uL — ABNORMAL LOW (ref 4.22–5.81)
RDW: 12.7 % (ref 11.5–15.5)
WBC: 8.9 10*3/uL (ref 4.0–10.5)
nRBC: 0 % (ref 0.0–0.2)

## 2023-10-18 MED ORDER — HYDROCODONE-ACETAMINOPHEN 5-325 MG PO TABS
1.0000 | ORAL_TABLET | Freq: Once | ORAL | Status: AC
  Administered 2023-10-18: 1 via ORAL
  Filled 2023-10-18: qty 1

## 2023-10-18 MED ORDER — METHOCARBAMOL 500 MG PO TABS: 500.0000 mg | ORAL_TABLET | Freq: Three times a day (TID) | ORAL | 0 refills | Status: DC | PRN

## 2023-10-18 MED ORDER — METHYLPREDNISOLONE 4 MG PO TBPK: ORAL_TABLET | ORAL | 0 refills | Status: DC

## 2023-10-18 MED ORDER — VALACYCLOVIR HCL 1 G PO TABS
1000.0000 mg | ORAL_TABLET | Freq: Three times a day (TID) | ORAL | 0 refills | Status: DC
Start: 2023-10-18 — End: 2023-10-27

## 2023-10-18 MED ORDER — DEXAMETHASONE SODIUM PHOSPHATE 10 MG/ML IJ SOLN
10.0000 mg | Freq: Once | INTRAMUSCULAR | Status: AC
  Administered 2023-10-18: 10 mg via INTRAMUSCULAR
  Filled 2023-10-18 (×2): qty 1

## 2023-10-18 NOTE — Discharge Instructions (Addendum)
 T12- L1: Disc narrowing and bulging eccentric to the right with ventral endplate spurring.  L1-L2: Disc narrowing and bulging with ventral endplate spurring.  L2-L3: Disc narrowing and bulging greatest at the foramina. Degenerative facet spurring with ligamentum flavum thickening.  L3-L4: Disc space narrowing and endplate degeneration with circumferential disc bulging and ridging. Facet spurring and ligamentum flavum thickening. Moderate triangular narrowing of the thecal sac. The foramina are patent  L4-L5: Degenerative facet spurring which is bulky with ligamentum flavum thickening and anterolisthesis. The disc is narrowed and bulging. Moderate spinal stenosis. Patent foramina.  L5-S1:Disc collapse and endplate degeneration with fatty marrow conversion. Endplate and facet spurring causes moderate right foraminal stenosis. Patent spinal canal.  IMPRESSION: 1. 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 2. Up to moderate spinal stenosis at L3-4 and L4-5. 3. Moderate foraminal narrowing on the right at L5-S1.   Please call Dr. Lindalou Hose office today for recheck asap Return if any of the symptoms as we discussed. Return if you have new or worsening symptoms

## 2023-10-18 NOTE — ED Provider Notes (Signed)
 [place] EMERGENCY DEPARTMENT AT [facility] Provider Note   CSN: [id number] Arrival date & time: 10/18/23  4098     History  Chief Complaint  Patient presents with   Back Pain    Clifford Say. is a 78 y.o. male.  Patient with history of CKD, hypertension, hyperlipidemia presents with 1 month history of burning pain to his right leg.  Starts in his right buttock and hip area and radiates down to his mid lower leg.  Denies any fall or trauma.  He has been seen by chiropractor as well as orthopedics without a clear diagnosis.  X-rays of his back which showed "severe arthritis" and scoliosis.  He was given what sounds like a course of prednisone without relief.  He is now currently taking Zanaflex and topical Voltaren gel.  States he has burning pain in the leg that is worse at night making it difficult for him to sleep.  Pain starts in his right hip and radiates down his thigh to about his mid lower leg.  He does feel weak in the leg has decreased sensation to the right leg.  Denies any fevers, chills, nausea, vomiting, chest pain, shortness of breath.  No history of IV drug abuse or cancer.  No bowel or bladder incontinence. No back surgeries.  No abdominal pain. He has also noticed a rash to his right medial thigh that has been there for about a month as well.  He believes the pain proceed with this rash.  The rash is itchy and not in the entire distribution of where his pain is.  The history is provided by the patient.  Back Pain Associated symptoms: no abdominal pain, no chest pain, no dysuria, no fever, no headaches and no weakness        Home Medications Prior to Admission medications   Medication Sig Start Date End Date Taking? Authorizing Provider  aspirin EC 81 MG tablet Take 1 tablet (81 mg total) by mouth daily. Swallow whole. 08/27/23   Corwin Levins, MD  atorvastatin (LIPITOR) 10 MG tablet TAKE 1 TABLET BY MOUTH DAILY 07/30/23   Corwin Levins, MD   fluorouracil (EFUDEX) 5 % cream Apply 1 Application topically 2 (two) times daily. 06/15/23   [provider]  losartan (COZAAR) 100 MG tablet Take 1 tablet (100 mg total) by mouth daily. 03/17/23   Corwin Levins, MD  MAGNESIUM CARBONATE PO Take 1 tablet by mouth.    [provider]  meclizine (ANTIVERT) 25 MG tablet Take 1 tablet (25 mg total) by mouth 3 (three) times daily as needed for dizziness. 06/24/21   Corwin Levins, MD  pantoprazole (PROTONIX) 40 MG tablet Take 1 tablet (40 mg total) by mouth daily. 01/04/23   Corwin Levins, MD  sildenafil (VIAGRA) 100 MG tablet Take 0.5-1 tablets (50-100 mg total) by mouth daily as needed for erectile dysfunction. 09/03/22   Corwin Levins, MD  valACYclovir (VALTREX) 1000 MG tablet Take 2 tablets by mouth twice daily for 1 day as needed for cold sores 05/23/20   Corwin Levins, MD      Allergies    Patient has no known allergies.    Review of Systems   Review of Systems  Constitutional:  Negative for activity change, appetite change and fever.  HENT:  Negative for congestion.   Respiratory:  Negative for cough, chest tightness and shortness of breath.   Cardiovascular:  Negative for chest pain.  Gastrointestinal:  Negative for abdominal pain and nausea.  Genitourinary:  Negative for dysuria and hematuria.  Musculoskeletal:  Positive for arthralgias, back pain and myalgias.  Skin:  Positive for rash.  Neurological:  Negative for dizziness, weakness and headaches.   all other systems are negative except as noted in the HPI and PMH.    Physical Exam Updated Vital Signs BP (!) 142/83 (BP Location: Right Arm)   Pulse 67   Temp (!) 97.4 F (36.3 C)   Resp 17   Ht 5\' 4"  (1.626 m)   Wt 75.3 kg   SpO2 96%   BMI 28.49 kg/m  Physical Exam Vitals and nursing note reviewed.  Constitutional:      General: He is not in acute distress.    Appearance: He is well-developed.  HENT:     Head: Normocephalic and atraumatic.      Mouth/Throat:     Pharynx: No oropharyngeal exudate.  Eyes:     Conjunctiva/sclera: Conjunctivae normal.     Pupils: Pupils are equal, round, and reactive to light.  Neck:     Comments: No meningismus. Cardiovascular:     Rate and Rhythm: Normal rate and regular rhythm.     Heart sounds: Normal heart sounds. No murmur heard. Pulmonary:     Effort: Pulmonary effort is normal. No respiratory distress.     Breath sounds: Normal breath sounds.  Abdominal:     Palpations: Abdomen is soft.     Tenderness: There is no abdominal tenderness. There is no guarding or rebound.  Musculoskeletal:        General: Tenderness present. Normal range of motion.     Cervical back: Normal range of motion and neck supple.     Comments: 5/5 strength in bilateral lower extremities. Ankle plantar and dorsiflexion intact. Great toe extension intact bilaterally. +2 DP and PT pulses. +2 patellar reflexes on L, +1 patellar reflex on R. Normal gait.  Diminished reflex on the right compared to the left.  Decreased sensation subjectively on the right leg.  Erythematous rash as depicted right medial thigh.  Not necessarily vesicular.  Skin:    General: Skin is warm.     Findings: Rash present.  Neurological:     Mental Status: He is alert and oriented to person, place, and time.     Cranial Nerves: No cranial nerve deficit.     Motor: No abnormal muscle tone.     Coordination: Coordination normal.     Comments:  5/5 strength throughout. CN 2-12 intact.Equal grip strength.   Psychiatric:        Behavior: Behavior normal.     ED Results / Procedures / Treatments   Labs (all labs ordered are listed, but only abnormal results are displayed) Labs Reviewed  CBC WITH DIFFERENTIAL/PLATELET - Abnormal; Notable for the following components:      Result Value   RBC 3.74 (*)    Hemoglobin 12.6 (*)    HCT 37.3 (*)    Platelets 435 (*)    All other components within normal limits  BASIC METABOLIC PANEL     EKG None  Radiology No results found.  Procedures Procedures    Medications Ordered in ED Medications  dexamethasone (DECADRON) injection 10 mg (has no administration in time range)  HYDROcodone-acetaminophen (NORCO/VICODIN) 5-325 MG per tablet 1 tablet (has no administration in time range)    ED Course/ Medical Decision Making/ A&P  Medical Decision Making Amount and/or Complexity of Data Reviewed Labs: ordered. Decision-making details documented in ED Course. Radiology: ordered and independent interpretation performed. Decision-making details documented in ED Course. ECG/medicine tests: ordered and independent interpretation performed. Decision-making details documented in ED Course.  Risk Prescription drug management.   1 month of right sided burning leg pain, numbness and weakness.  Vitals are stable.  On exam he has intact distal strength, sensation, pulses but diminished reflexes on the right with diminished sensation subjectively. Does have a rash to his right medial thigh that could be suspicious for shingles but would not explain his entire pain in the leg.  Will obtain MRI for evaluation of sciatica.  Low suspicion for vascular catastrophe or intra-abdominal process.  Given his right sided leg pain with paresthesias and decreased reflex will obtain MRI.  Rash to legs suspicious for possible shingles and will initiate antivirals as this may be contributing to his discomfort.  Will refer to neurosurgery.  Will give steroids and muscle relaxers.  MRI pending at shift change.        Final Clinical Impression(s) / ED Diagnoses Final diagnoses:  Sciatica of right side    Rx / DC Orders ED Discharge Orders     None         Kahil Agner, Jeannett Senior, MD 10/18/23 [phone]

## 2023-10-18 NOTE — ED Provider Notes (Signed)
  Physical Exam  BP (!) 142/83 (BP Location: Right Arm)   Pulse 67   Temp (!) 97.4 F (36.3 C)   Resp 17   Ht 1.626 m (5\' 4" )   Wt 75.3 kg   SpO2 96%   BMI 28.49 kg/m   Physical Exam  Procedures  Procedures  ED Course / MDM   Clinical Course as of 10/18/23 0920  Mon Oct 18, 2023  0909 Reviewed mr  [DR]  0911 Generalized ls degeneration, moderate spinal stenosis,moderate foraminal narrowing [DR]    Clinical Course User Index [DR] Margarita Grizzle, MD   Medical Decision Making Amount and/or Complexity of Data Reviewed Labs: ordered. Radiology: ordered.  Risk Prescription drug management.  Received from Dr. Manus Gunning 78 yo male with burning pain mid thig to calf on right. Decreased sensation  No red flags CKD MRI of lumbar spine Thigh rash question herpes Patient cued to d/c on valtrex I discussed results with patient and family.  We have discussed the patient's need for outpatient follow-up, return precautions given.        Margarita Grizzle, MD 10/18/23 [phone]

## 2023-10-18 NOTE — ED Triage Notes (Signed)
 Patient c/o lower back pain with radiation into his left upper thigh, states he has had this pain x 1 month was seen several times by ortho emerg. And was given medication however states he wasn't able to take the medicine as directed because of the side effects was only able to take at night.

## 2023-10-20 DIAGNOSIS — M5416 Radiculopathy, lumbar region: Secondary | ICD-10-CM | POA: Diagnosis not present

## 2023-10-20 DIAGNOSIS — M47816 Spondylosis without myelopathy or radiculopathy, lumbar region: Secondary | ICD-10-CM | POA: Diagnosis not present

## 2023-10-20 DIAGNOSIS — Z6829 Body mass index (BMI) 29.0-29.9, adult: Secondary | ICD-10-CM | POA: Diagnosis not present

## 2023-10-21 ENCOUNTER — Encounter: Payer: Self-pay | Admitting: Internal Medicine

## 2023-10-21 ENCOUNTER — Ambulatory Visit (INDEPENDENT_AMBULATORY_CARE_PROVIDER_SITE_OTHER): Admitting: Internal Medicine

## 2023-10-21 VITALS — BP 116/70 | HR 57 | Temp 98.5°F | Ht 64.0 in | Wt 168.0 lb

## 2023-10-21 DIAGNOSIS — R931 Abnormal findings on diagnostic imaging of heart and coronary circulation: Secondary | ICD-10-CM | POA: Diagnosis not present

## 2023-10-21 DIAGNOSIS — N1831 Chronic kidney disease, stage 3a: Secondary | ICD-10-CM | POA: Diagnosis not present

## 2023-10-21 DIAGNOSIS — M1711 Unilateral primary osteoarthritis, right knee: Secondary | ICD-10-CM | POA: Diagnosis not present

## 2023-10-21 DIAGNOSIS — I10 Essential (primary) hypertension: Secondary | ICD-10-CM

## 2023-10-21 DIAGNOSIS — M5431 Sciatica, right side: Secondary | ICD-10-CM | POA: Diagnosis not present

## 2023-10-21 NOTE — Assessment & Plan Note (Signed)
 Mod to severe it seems CT cardiac score 755, cont current asa, statin and f/u cardiology as planned

## 2023-10-21 NOTE — Assessment & Plan Note (Signed)
 BP Readings from Last 3 Encounters:  10/21/23 116/70  10/18/23 130/65  07/05/23 126/78   Stable, pt to continue medical treatment losartan 100 qd

## 2023-10-21 NOTE — Patient Instructions (Addendum)
 Ok to stop all anti-inflammatory (except the aspirin)  Ok to use the Voltaren gel topical for the hands and knees  Please continue all other medications as before, and refills have been done if requested.  Please have the pharmacy call with any other refills you may need.  Please keep your appointments with your specialists as you may have planned - Cardiology next week  Please return to the LAB in 1 week for repeat kidney testing to make sure it is getting better again  Please keep an Appointment to return in May 19, or sooner if needed

## 2023-10-21 NOTE — Assessment & Plan Note (Signed)
 D/w pt - ok for volt gel prn

## 2023-10-21 NOTE — Progress Notes (Signed)
 Patient ID: Clifford Brazil., male   DOB: 1946-05-14, 78 y.o.   MRN: [id number]        Chief Complaint: follow up recent right lumbar radiculopathy, CKD3a, and elevated Cardiac Score       HPI:  Clifford Krakow. is a 78 y.o. male here with recent complicated hx of acute right lumbar radicular symptoms fortunately now resolved today; pt states played golf feb 2, then feb 4 had a misstep off a truck at work (still working 40 hrs) which did not seem significant at the time, but feb 6 had onset right lumbar pain with RLE pain,numbness, pain essentially only with lying down at night, but severe all the same.  Has seen numberous providers including ortho PA, ortho MD, chiropracter x 2, ED on mar 3 then finally NS yesterday with different tx including muscle relaxers, tramadol, prednisone, but little help;  had CT ls spine mar 3 abnormal, saw NS yesterday with no recommendation for surgury, but to try med tx with prednisone taper repeat, hydrocodone prn and ESI first and has f/u appt in 3 wks.  Pt also has taken several nsaids and noted to have GFR 36 on labs mar 3, when baseline has been cr 1.6 and GFRs in the mid 40's.  Has seen renal last year and not felt to need f/u soon as he was so stable overall.  Wt Readings from Last 3 Encounters:  10/21/23 168 lb (76.2 kg)  10/18/23 166 lb (75.3 kg)  07/05/23 172 lb (78 kg)   BP Readings from Last 3 Encounters:  10/21/23 116/70  10/18/23 130/65  07/05/23 126/78         Past Medical History:  Diagnosis Date   Chicken pox    Erectile dysfunction    Heart murmur    Herpes simplex    HLD (hyperlipidemia) 06/16/2019   Past Surgical History:  Procedure Laterality Date   COLONOSCOPY     15+yrs ago- normal per pt.   SPLENECTOMY      reports that he has never smoked. He has never used smokeless tobacco. He reports current alcohol use of about 2.0 - 4.0 standard drinks of alcohol per week. He reports that he does not use drugs. family history  includes Diabetes in his father; Pancreatic cancer in his mother. Allergies  Allergen Reactions   Nsaids Other (See Comments)    Ckd 3b   Current Outpatient Medications on File Prior to Visit  Medication Sig Dispense Refill   aspirin EC 81 MG tablet Take 1 tablet (81 mg total) by mouth daily. Swallow whole.     atorvastatin (LIPITOR) 10 MG tablet TAKE 1 TABLET BY MOUTH DAILY 90 tablet 3   fluorouracil (EFUDEX) 5 % cream Apply 1 Application topically 2 (two) times daily.     losartan (COZAAR) 100 MG tablet Take 1 tablet (100 mg total) by mouth daily. 90 tablet 3   MAGNESIUM CARBONATE PO Take 1 tablet by mouth.     meclizine (ANTIVERT) 25 MG tablet Take 1 tablet (25 mg total) by mouth 3 (three) times daily as needed for dizziness. 30 tablet 1   methocarbamol (ROBAXIN) 500 MG tablet Take 1 tablet (500 mg total) by mouth every 8 (eight) hours as needed for muscle spasms. 20 tablet 0   methylPREDNISolone (MEDROL DOSEPAK) 4 MG TBPK tablet As directed 21 each 0   pantoprazole (PROTONIX) 40 MG tablet Take 1 tablet (40 mg total) by mouth daily. 90 tablet 3  sildenafil (VIAGRA) 100 MG tablet Take 0.5-1 tablets (50-100 mg total) by mouth daily as needed for erectile dysfunction. 10 tablet 11   valACYclovir (VALTREX) 1000 MG tablet Take 1 tablet (1,000 mg total) by mouth 3 (three) times daily. 21 tablet 0   No current facility-administered medications on file prior to visit.        ROS:  All others reviewed and negative.  Objective        PE:  BP 116/70 (BP Location: Right Arm, Patient Position: Sitting, Cuff Size: Normal)   Pulse (!) 57   Temp 98.5 F (36.9 C) (Oral)   Ht 5\' 4"  (1.626 m)   Wt 168 lb (76.2 kg)   SpO2 97%   BMI 28.84 kg/m                 Constitutional: Pt appears in NAD               HENT: Head: NCAT.                Right Ear: External ear normal.                 Left Ear: External ear normal.                Eyes: . Pupils are equal, round, and reactive to light.  Conjunctivae and EOM are normal               Nose: without d/c or deformity               Neck: Neck supple. Gross normal ROM               Cardiovascular: Normal rate and regular rhythm.                 Pulmonary/Chest: Effort normal and breath sounds without rales or wheezing.                Abd:  Soft, NT, ND, + BS, no organomegaly               Neurological: Pt is alert. At baseline orientation, motor grossly intact               Skin: Skin is warm. No rashes, no other new lesions, LE edema - none               Psychiatric: Pt behavior is normal without agitation   Micro: none  Cardiac tracings I have personally interpreted today:  none  Pertinent Radiological findings (summarize): none   Lab Results  Component Value Date   WBC 8.9 10/18/2023   HGB 12.6 (L) 10/18/2023   HCT 37.3 (L) 10/18/2023   PLT 435 (H) 10/18/2023   GLUCOSE 106 (H) 10/18/2023   CHOL 123 07/08/2023   TRIG 266.0 (H) 07/08/2023   HDL 28.80 (L) 07/08/2023   LDLDIRECT 53.0 06/30/2023   LDLCALC 41 07/08/2023   ALT 16 07/08/2023   AST 21 07/08/2023   NA 138 10/18/2023   K 5.3 (H) 10/18/2023   CL 107 10/18/2023   CREATININE 1.91 (H) 10/18/2023   BUN 38 (H) 10/18/2023   CO2 20 (L) 10/18/2023   TSH 1.81 07/08/2023   PSA 2.12 07/08/2023   INR 0.89 09/25/2009   HGBA1C 6.2 07/08/2023   MICROALBUR 0.7 07/08/2023   Assessment/Plan:  Clifford Monday. is a 78 y.o. White or Caucasian [1] male with  has a past medical history of Chicken pox,  Erectile dysfunction, Heart murmur, Herpes simplex, and HLD (hyperlipidemia) (06/16/2019).  CKD (chronic kidney disease) stage 3, GFR 30-59 ml/min With mild acute worsening - for d/c nsaids, increased fluids, and f/u lab 1 wk  Right sided sciatica Now essentially resolved with med tx per NS recently, slept well last pm, may need ESI if worsens per NS, but no surgury needed soon.  Cont current tx  HTN (hypertension) BP Readings from Last 3 Encounters:  10/21/23  116/70  10/18/23 130/65  07/05/23 126/78   Stable, pt to continue medical treatment losartan 100 qd   Arthritis of right knee D/w pt - ok for volt gel prn  Elevated coronary artery calcium score Mod to severe it seems CT cardiac score 755, cont current asa, statin and f/u cardiology as planned  Followup: Return in about 2 months (around 01/03/2024).  Oliver Barre, MD 10/21/2023 12:54 PM [facility]

## 2023-10-21 NOTE — Assessment & Plan Note (Signed)
 With mild acute worsening - for d/c nsaids, increased fluids, and f/u lab 1 wk

## 2023-10-21 NOTE — Assessment & Plan Note (Signed)
 Now essentially resolved with med tx per NS recently, slept well last pm, may need ESI if worsens per NS, but no surgury needed soon.  Cont current tx

## 2023-10-25 NOTE — Progress Notes (Unsigned)
 Cardiology Office Note:  .   Date:  10/27/2023 ID:  Clifford Gould., DOB 1946-04-16, MRN [id number] PCP: Corwin Levins, MD [facility] Providers Cardiologist:  None   Patient Profile: .      PMH Hyperlipidemia CKD Stage 3b Hypertension Coronary artery disease CT Calcium score 08/27/2023 CAC 755 (71st percentile) LM 97, LAD 250, LCx 158, RCA 250 Sciatica       History of Present Illness: .   Clifford Gould. is a very pleasant 78 y.o. male  who is here today for new patient consult for elevated coronary calcium score. His daughter is listening on cell phone during the visit. He reports CT was ordered for screening. He reports he has not had any symptoms of chest pain, shortness of breath, or palpitations.  He has been experiencing back and leg pain for the past month which has limited his physical activity.  It is finally starting to improve and he is looking forward to resuming pickleball and golf. 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  He frequently gets > 5000 steps daily. He previously worked as a Barista.  No significant family history of heart disease. His mother died from pancreatic cancer at the age of 77 and his father died following 2 falls at age 33.  Patient's daughter thinks her grandfather had CHF later in life.  He reports known history of hyperlipidemia for several years but reports it has been well-controlled on atorvastatin. BP has been well-controlled. He lives alone and does not enjoy cooking. Eats a fairly healthy diet but admits to frequenting the Asian bar at [employer], but adds his own food to things he gets off the bar. Frequently eats fruits and vegetables.  Family history: His family history includes Diabetes in his father; Pancreatic cancer in his mother.   Discussed the use of AI scribe software for clinical note transcription with the patient, who  gave verbal consent to proceed.  ASCVD Risk Score:  ASCVD (Atherosclerotic Cardiovascular Disease) Risk Algorithm including Known ASCVD from AHA/ACC from [email or web address]  on 10/25/2023 ** All calculations should be rechecked by clinician prior to use **  RESULT SUMMARY: 31.4 % Risk of cardiovascular event (coronary or stroke death or non-fatal MI or stroke) in next 10 years.  INPUTS: History of ASCVD --> 0 = No LDL Cholesterol >=190mg /dL (7.82 mmol/L) --> 0 = No Age --> 77 years Diabetes --> 0 = No Sex --> 1 = Male Total Cholesterol --> 123 mg/dL HDL Cholesterol --> 95.6 mg/dL Systolic Blood Pressure --> 130 mm Hg Treatment for Hypertension --> 1 = Yes Smoker --> 0 = No Race --> 1 = White  Diet: Likes salads and fruit, more in the summer Frequently visits [employer] bars - mix of salad, Asian food, and hot bar Breakfast is small - banana and power bar or 1/2 sandwich  Activity: Works as a International aid/development worker for [employer], getting in and out of cars and walking frequently 5000-15000 steps daily Pickleball and golf No regular exercise routine Light weights and resistance exercises at home  No results found for: "LIPOA"    ROS: See HPI       Studies Reviewed: Marland Kitchen   EKG Interpretation Date/Time:  Wednesday October 27 2023 14:01:24 EDT Ventricular Rate:  74 PR Interval:  158 QRS Duration:  72 QT Interval:  362 QTC Calculation: 401 R Axis:  20  Text Interpretation: Normal sinus rhythm Normal ECG No previous ECGs available Confirmed by Eligha Bridegroom [phone]) on 10/27/2023 2:10:04 PM      Risk Assessment/Calculations:             Physical Exam:   VS: BP 112/60   Ht 5\' 4"  (1.626 m)   Wt 165 lb (74.8 kg)   SpO2 94%   BMI 28.32 kg/m   Wt Readings from Last 3 Encounters:  10/27/23 165 lb (74.8 kg)  10/21/23 168 lb (76.2 kg)  10/18/23 166 lb (75.3 kg)     GEN: Well nourished, well developed in no acute distress NECK: No JVD; No carotid bruits CARDIAC: RRR, no murmurs,  rubs, gallops RESPIRATORY:  Clear to auscultation without rales, wheezing or rhonchi  ABDOMEN: Soft, non-tender, non-distended EXTREMITIES:  No edema; No deformity     ASSESSMENT AND PLAN: .    CAD: CT calcium score 08/27/2023 with CAC score of 755 (71st percentile) with distribution of calcium across left main, LAD, left circumflex, and RCA. He lives an active life but has been somewhat physically limited recently due to back and leg pain.  EKG today reveals NSR, no ST abnormality.  He denies chest pain, dyspnea, or other symptoms concerning for angina.  However, with significantly elevated CAC and additional risk factors of hypertension, and age, I would like to get cardiac PET CT for further ischemia evaluation. This was discussed in detail.  He would like to take home the information and consider scheduling for the future. Recommended that he focus on secondary prevention including heart healthy mostly plant based diet avoiding saturated fat, processed foods, simple carbohydrates, and sugar along with aiming for at least 150 minutes of moderate intensity exercise each week, when he is able to resume exercise.  No bleeding concerns.  Continue aspirin, atorvastatin, and losartan.  Advised him to notify us for additional testing or follow-up or with concerns.  Hyperlipidemia LDL goal < 70: Lipid panel completed 07/08/2023 with total cholesterol 123, triglycerides 266, HDL 28.80, LDL 41. He reports good cholesterol control for several years. Continue atorvastatin.  Hypertension: BP is well controlled.  Renal function stable on labs completed 10/26/2023.  No medication changes today.  Management per PCP.  CKD Stage 3b: He reports renal function has been stable. Advised that PET CT will not affect renal function.   CV Risk Assessment: ASCVD risk score 31.4%.  We discussed the individual components with his age being the primary reason for elevated score. LDL cholesterol is at goal of < 70. BP is  well-controlled. Hgb A1C 6.2 on 07/08/23. He is not on medication for diabetes.  He will consider PET CT for further ischemia evaluation.    Goals: 1: Continue regular exercise when you are able 2: Aim for a diet that is primarily focused on fresh vegetables, lean protein, fruit, and whole grains      Informed Consent   Shared Decision Making/Informed Consent The risks [chest pain, shortness of breath, cardiac arrhythmias, dizziness, blood pressure fluctuations, myocardial infarction, stroke/transient ischemic attack, nausea, vomiting, allergic reaction, radiation exposure, metallic taste sensation and life-threatening complications (estimated to be 1 in 10,000)], benefits (risk stratification, diagnosing coronary artery disease, treatment guidance) and alternatives of a cardiac PET stress test were discussed in detail with Mr. Clauson and he agrees to proceed.     Disposition: As needed  Signed, Eligha Bridegroom, NP-C

## 2023-10-26 ENCOUNTER — Encounter: Payer: Self-pay | Admitting: Internal Medicine

## 2023-10-26 ENCOUNTER — Other Ambulatory Visit (INDEPENDENT_AMBULATORY_CARE_PROVIDER_SITE_OTHER)

## 2023-10-26 DIAGNOSIS — N1831 Chronic kidney disease, stage 3a: Secondary | ICD-10-CM

## 2023-10-26 LAB — BASIC METABOLIC PANEL
BUN: 41 mg/dL — ABNORMAL HIGH (ref 6–23)
CO2: 28 meq/L (ref 19–32)
Calcium: 9.7 mg/dL (ref 8.4–10.5)
Chloride: 101 meq/L (ref 96–112)
Creatinine, Ser: 1.56 mg/dL — ABNORMAL HIGH (ref 0.40–1.50)
GFR: 42.79 mL/min — ABNORMAL LOW (ref >60.00)
Glucose, Bld: 100 mg/dL — ABNORMAL HIGH (ref 70–99)
Potassium: 5.2 meq/L — ABNORMAL HIGH (ref 3.5–5.1)
Sodium: 137 meq/L (ref 135–145)

## 2023-10-27 ENCOUNTER — Encounter (HOSPITAL_BASED_OUTPATIENT_CLINIC_OR_DEPARTMENT_OTHER): Payer: Self-pay | Admitting: Nurse Practitioner

## 2023-10-27 ENCOUNTER — Ambulatory Visit (HOSPITAL_BASED_OUTPATIENT_CLINIC_OR_DEPARTMENT_OTHER): Payer: No Typology Code available for payment source | Admitting: Nurse Practitioner

## 2023-10-27 ENCOUNTER — Encounter: Payer: Self-pay | Admitting: Nurse Practitioner

## 2023-10-27 VITALS — BP 112/60 | Ht 64.0 in | Wt 165.0 lb

## 2023-10-27 DIAGNOSIS — E785 Hyperlipidemia, unspecified: Secondary | ICD-10-CM

## 2023-10-27 DIAGNOSIS — N1832 Chronic kidney disease, stage 3b: Secondary | ICD-10-CM | POA: Diagnosis not present

## 2023-10-27 DIAGNOSIS — I10 Essential (primary) hypertension: Secondary | ICD-10-CM

## 2023-10-27 DIAGNOSIS — Z7189 Other specified counseling: Secondary | ICD-10-CM

## 2023-10-27 DIAGNOSIS — R931 Abnormal findings on diagnostic imaging of heart and coronary circulation: Secondary | ICD-10-CM

## 2023-10-27 DIAGNOSIS — I2510 Atherosclerotic heart disease of native coronary artery without angina pectoris: Secondary | ICD-10-CM | POA: Diagnosis not present

## 2023-10-27 NOTE — Patient Instructions (Signed)
 Medication Instructions:   Your physician recommends that you continue on your current medications as directed. Please refer to the Current Medication list given to you today.   *If you need a refill on your cardiac medications before your next appointment, please call your pharmacy*   Lab Work:  None ordered.  If you have labs (blood work) drawn today and your tests are completely normal, you will receive your results only by: MyChart Message (if you have MyChart) OR A paper copy in the mail If you have any lab test that is abnormal or we need to change your treatment, we will call you to review the results.   Testing/Procedures:  Nuclear Medicine Exam: What to Expect A nuclear medicine exam is a safe and painless imaging test. It can help your health care provider find diseases. It also shows how your organs work and how they're structured. For the exam, you'll be given a radioactive substance called a tracer. This will be absorbed by your body's organs. The tracer will show up when the scanner takes pictures of your body. There are a few kinds of this exam. They include: A CT scan. An MRI. A PET scan. A SPECT scan. Tell your health care provider about: Any allergies you have. All medicines you take. These include vitamins, herbs, eye drops, and creams. Any problems you or family members have had with anesthesia. Any bleeding problems you have. Any surgeries you've had. Any medical conditions you have. Whether you're pregnant, may be pregnant, or are breastfeeding. What are the risks? Your provider will talk with you about risks. These may include: Exposure to a small amount of radiation. An allergic reaction to the tracer. This is rare. What happens before the exam? Medicines Ask about changing or stopping: Any medicines you take. Any vitamins, herbs, or supplements you take. Do not take aspirin or ibuprofen unless you're told to. General instructions Eat and drink  as told. Do not wear jewelry. Wear loose, comfortable clothes. You may be asked to wear a hospital gown for the exam. Bring past imaging studies with you if you have them. These include X-rays. What happens during the exam?  An IV will be put into a vein in your hand or arm. You'll be asked to lie on a table or sit in a chair. You'll be given the tracer. It may be given as: A pill or liquid to swallow. A shot. Medicine through your IV. A gas to breathe in. A large scanning machine will be used to make pictures of your body. After the pictures are taken, you may have to wait until your provider can make sure that enough pictures were taken. These steps may vary. Ask what you can expect. What can I expect after the exam? Ask when your exam results will be ready and how to get them. You may need to call or meet with your provider to get your results. Also ask: What are my treatment options? What other tests do I need? What are my next steps? Follow these instructions at home: Drink more fluids as told. You may need to drink 6-8 glasses of water. This helps to remove the tracer from your body. Ask what things are safe for you to do at home. Ask when you can go back to work or school. Get help right away if: You have trouble breathing. This symptom may be an emergency. Call 911 right away. Do not wait to see if the symptom will go away. Do  not drive yourself to the hospital. This information is not intended to replace advice given to you by your health care provider. Make sure you discuss any questions you have with your health care provider. Document Revised: 02/17/2023 Document Reviewed: 02/17/2023 Elsevier Patient Education  2024 Elsevier Inc.   Follow-Up: At [facility], you and your health needs are our priority.  As part of our continuing mission to provide you with exceptional heart care, we have created designated Provider Care Teams.  These Care Teams include your  primary Cardiologist (physician) and Advanced Practice Providers (APPs -  Physician Assistants and Nurse Practitioners) who all work together to provide you with the care you need, when you need it.  We recommend signing up for the patient portal called "MyChart".  Sign up information is provided on this After Visit Summary.  MyChart is used to connect with patients for Virtual Visits (Telemedicine).  Patients are able to view lab/test results, encounter notes, upcoming appointments, etc.  Non-urgent messages can be sent to your provider as well.   To learn more about what you can do with MyChart, go to [email or web address].    Your next appointment:   As needed.  Provider:   Eligha Bridegroom, NP    Other Instructions As needed follow up. Goals: 1: Continue regular exercise when you are able 2: Aim for a diet that is primarily focused on fresh vegetables, lean protein, fruit, and whole grains

## 2023-11-05 DIAGNOSIS — H2513 Age-related nuclear cataract, bilateral: Secondary | ICD-10-CM | POA: Diagnosis not present

## 2023-11-08 ENCOUNTER — Encounter: Payer: Self-pay | Admitting: Internal Medicine

## 2023-11-08 DIAGNOSIS — M25561 Pain in right knee: Secondary | ICD-10-CM

## 2023-11-08 DIAGNOSIS — M545 Low back pain, unspecified: Secondary | ICD-10-CM

## 2023-11-10 ENCOUNTER — Other Ambulatory Visit: Payer: Self-pay

## 2023-11-10 ENCOUNTER — Ambulatory Visit: Admitting: Family Medicine

## 2023-11-10 ENCOUNTER — Ambulatory Visit (INDEPENDENT_AMBULATORY_CARE_PROVIDER_SITE_OTHER)

## 2023-11-10 VITALS — BP 178/88 | HR 62 | Ht 64.0 in | Wt 169.0 lb

## 2023-11-10 DIAGNOSIS — M25561 Pain in right knee: Secondary | ICD-10-CM | POA: Diagnosis not present

## 2023-11-10 DIAGNOSIS — B354 Tinea corporis: Secondary | ICD-10-CM | POA: Diagnosis not present

## 2023-11-10 DIAGNOSIS — M1711 Unilateral primary osteoarthritis, right knee: Secondary | ICD-10-CM

## 2023-11-10 DIAGNOSIS — M5416 Radiculopathy, lumbar region: Secondary | ICD-10-CM | POA: Diagnosis not present

## 2023-11-10 DIAGNOSIS — G8929 Other chronic pain: Secondary | ICD-10-CM

## 2023-11-10 NOTE — Progress Notes (Unsigned)
   Rubin Payor, PhD, LAT, ATC acting as a scribe for Clementeen Graham, MD.  Arlene Genova. is a 78 y.o. male who presents to [facility] today for low back and R leg pain has somewhat improved. He now c/o R knee pain ongoing since Feb, worsening. He notes suffering 2 falls. Pt locates pain to all over the knee, anterior R thigh and anterior R lower leg. He notes there are 3 red dots on the medial aspect of his distal thigh. Pain is interrupting his sleep  He notes he has been seen previously at [facility] and at the ED on 3/3  Radiating pain: yes LE numbness/tingling: yes LE weakness: yes Aggravates: stairs, walking Treatments tried: ice, heat, massage gun, tramadol, prednisone, tizanidine, methocarbamol  Dx imaging: 10/18/23 L-spine MRI  Pertinent review of systems: ***  Relevant historical information: ***   Exam:  There were no vitals taken for this visit. General: Well Developed, well nourished, and in no acute distress.   MSK: ***    Lab and Radiology Results No results found for this or any previous visit (from the past 72 hours). No results found.     Assessment and Plan: 78 y.o. male with ***   PDMP not reviewed this encounter. No orders of the defined types were placed in this encounter.  No orders of the defined types were placed in this encounter.    Discussed warning signs or symptoms. Please see discharge instructions. Patient expresses understanding.   ***

## 2023-11-10 NOTE — Patient Instructions (Addendum)
  Thank you for coming in today.   Please get an Xray today before you leave   You received an injection today. Seek immediate medical attention if the joint becomes red, extremely painful, or is oozing fluid.   Ok to use over the counter lamisil (terbinifine) for the rash. If not better let me or Dr Jonny Ruiz know.

## 2023-11-15 ENCOUNTER — Encounter: Payer: Self-pay | Admitting: Family Medicine

## 2023-11-15 ENCOUNTER — Encounter: Payer: Self-pay | Admitting: Internal Medicine

## 2023-11-22 ENCOUNTER — Encounter: Payer: Self-pay | Admitting: Family Medicine

## 2023-11-22 NOTE — Therapy (Signed)
 OUTPATIENT PHYSICAL THERAPY THORACOLUMBAR EVALUATION   Patient Name: Clifford Gould. MRN: [id number] DOB:12/12/1945, 78 y.o., male Today's Date: 11/24/2023  END OF SESSION:  PT End of Session - 11/24/23 1711     Visit Number 1    Number of Visits 13    Date for PT Re-Evaluation 01/12/24    Authorization Type [employer] [facility] [place]    PT Start Time 1500    PT Stop Time 1550    PT Time Calculation (min) 50 min    Activity Tolerance Patient tolerated treatment well    Behavior During Therapy WFL for tasks assessed/performed             Past Medical History:  Diagnosis Date   Chicken pox    Erectile dysfunction    Heart murmur    Herpes simplex    HLD (hyperlipidemia) 06/16/2019   Past Surgical History:  Procedure Laterality Date   COLONOSCOPY     15+yrs ago- normal per pt.   SPLENECTOMY     Patient Active Problem List   Diagnosis Date Noted   Degeneration of lumbar intervertebral disc 10/07/2023   Low back pain 09/27/2023   Elevated coronary artery calcium score 08/27/2023   Lumbar radiculopathy, right 07/06/2023   Abnormal LFTs 01/04/2023   Leukocytosis 01/04/2023   Anemia 01/04/2023   Epigastric pain 01/04/2023   Subacute cough 01/04/2023   Chest pain 07/03/2022   Arthritis of right knee 05/18/2020   HTN (hypertension) 05/17/2020   Primary osteoarthritis of left knee 12/19/2019   Erectile dysfunction 06/16/2019   HLD (hyperlipidemia) 06/16/2019   CKD (chronic kidney disease) stage 3, GFR 30-59 ml/min (HCC) 06/16/2019   Chronic pain of left knee 10/28/2018   Decreased range of motion of right elbow 04/23/2017   Encounter for well adult exam with abnormal findings 04/18/2015   Medicare annual wellness visit, subsequent 04/18/2015    PCP: Corwin Levins, MD  REFERRING PROVIDER: Corwin Levins, MD  REFERRING DIAG:  M54.50 (ICD-10-CM) - Low back pain, unspecified back pain laterality, unspecified chronicity, unspecified whether sciatica present  M25.561  (ICD-10-CM) - Acute pain of right knee    Rationale for Evaluation and Treatment: Rehabilitation  THERAPY DIAG:  Chronic pain of right knee  Other low back pain  Muscle weakness (generalized)  ONSET DATE: 09/19/23  SUBJECTIVE:                                                                                                                                                                                           SUBJECTIVE STATEMENT: Pt reports:  He developed back pain insidiously on 2/225. Pain  was radiating to his R thigh. He also developed R knee pain. He has experienced R leg weakness since October and he stopped playing pickle ball and golf. He has experienced 3 falls since February. With getting out of the tub (2) and when getting off a bike (1). He has seen ortho and neurosurgical MDs recently and finally new medications have helped his low back pain.. Primary concern is weakness and pain of the R knee and thigh.   PERTINENT HISTORY:  NA  PAIN:  Are you having pain? Yes: NPRS scale: 0-3/10  Pain location: R low back Pain description: Ache Aggravating factors: Bending forward and washing dishes Relieving factors: Pain meds  Are you having pain? Yes: NPRS scale: 3-9/10 Pain location: R knee nad thigh Pain description: ache Aggravating factors: Prolonged walking Relieving factors: Massage gun, hot tub  PRECAUTIONS: None  RED FLAGS: None   WEIGHT BEARING RESTRICTIONS: No  FALLS:  Has patient fallen in last 6 months? Yes. Number of falls 3 See subjective  LIVING ENVIRONMENT: Lives with: lives alone Lives in: House/apartment Able to access home  OCCUPATION: Shuttle driver- in/out of cars  PLOF: Independent  PATIENT GOALS: Get legs stronger, less knee pain , and better function  OBJECTIVE:  Note: Objective measures were completed at Evaluation unless otherwise noted.  DIAGNOSTIC FINDINGS:  MR Lumbar Spine 10/18/23 IMPRESSION: 1. 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 2. Up to moderate spinal stenosis at L3-4 and L4-5. 3. Moderate foraminal narrowing on the right at L5-S1.  3/36/25 DG R Knee 11/10/23 MPRESSION: Degenerative changes.  PATIENT SURVEYS:  LEFS 40/80=50%   COGNITION: Overall cognitive status: Within functional limits for tasks assessed     SENSATION: WFL  REFLEXES:  Patellar- R 0, L +2  Achilles- R +2, L +2  MUSCLE LENGTH: Hamstrings: Right tight deg; Left tight deg Thomas test: Right NT deg; Left NT deg  POSTURE: forward head  PALPATION: TTP to the R quad  LUMBAR ROM:   AROM eval  Flexion Full; min R LBP  Extension Min limited; no pain  Right lateral flexion Full; min R LBP  Left lateral flexion Full; min L LBP  Right rotation Full; no pain  Left rotation Full; no pain   (Blank rows = not tested)  LOWER EXTREMITY ROM:     AROM=WNLs Active  Right eval Left eval  Hip flexion    Hip extension    Hip abduction    Hip adduction    Hip internal rotation    Hip external rotation    Knee flexion    Knee extension    Ankle dorsiflexion    Ankle plantarflexion    Ankle inversion    Ankle eversion     (Blank rows = not tested)  LOWER EXTREMITY MMT:    MMT Right eval Left eval  Hip flexion 4 5  Hip extension 3 4  Hip abduction 4 5  Hip adduction    Hip internal rotation    Hip external rotation 4 5  Knee flexion 4 5  Knee extension 4 5  Ankle dorsiflexion    Ankle plantarflexion    Ankle inversion    Ankle eversion     (Blank rows = not tested)  LUMBAR SPECIAL TESTS:  Straight leg raise test: Negative and Slump test: Negative R knee apprehension Test= negative  FUNCTIONAL TESTS:  5 times sit to stand: TBA  GAIT: Distance walked: 200' Assistive device utilized: None Level of assistance: Complete Independence  Comments:Min limp R LE  TREATMENT DATE:  [facility] Adult PT Treatment:                                                DATE:  11/24/23 Therapeutic Exercise: Developed, instructed in, and pt completed therex as noted in HEP                                                                                                                         PATIENT EDUCATION:  Education details: Eval findings, POC, HEP, self care  Person educated: Patient Education method: Explanation, Demonstration, Tactile cues, Verbal cues, and Handouts Education comprehension: verbalized understanding, returned demonstration, verbal cues required, and tactile cues required  HOME EXERCISE PROGRAM: Access Code: [email or web address] URL: [email or web address] Date: 11/24/2023 Prepared by: Joellyn Rued  Exercises - Active Straight Leg Raise with Quad Set  - 1 x daily - 7 x weekly - 3 sets - 10 reps - 3 hold - Supine Bridge  - 1 x daily - 7 x weekly - 3 sets - 10 reps - 3 hold - Hooklying Clamshell with Resistance  - 1 x daily - 7 x weekly - 3 sets - 10 reps - 3 hold  ASSESSMENT:  CLINICAL IMPRESSION: Patient is a 78 y.o. male who was seen today for physical therapy evaluation and treatment for  M54.50 (ICD-10-CM) - Low back pain, unspecified back pain laterality, unspecified chronicity, unspecified whether sciatica present  M25.561 (ICD-10-CM) - Acute pain of right knee  Pt presents with decreased R hip and knee strength and an absent R patellar reflex. Pt's MRI of the low back indicates significant degenerative changes. R hip and knee weakness and R knee pain maybe related neural issues from the low back. Currently, pt's low back is much improved since the initial issue in early February. A HEP for LE strengthening was initiated. Pt will benefit from skilled 2w6 PT to address impairments to optimize function with less pain.   OBJECTIVE IMPAIRMENTS: decreased activity tolerance, decreased balance, difficulty walking, decreased strength, and pain.   ACTIVITY LIMITATIONS: carrying, lifting, bending, squatting, and locomotion  level  PARTICIPATION LIMITATIONS: meal prep, cleaning, laundry, community activity, and occupation  PERSONAL FACTORS: Past/current experiences and Time since onset of injury/illness/exacerbation are also affecting patient's functional outcome.   REHAB POTENTIAL: Good  CLINICAL DECISION MAKING: Evolving/moderate complexity  EVALUATION COMPLEXITY: Moderate   GOALS:  SHORT TERM GOALS=LTGs   LONG TERM GOALS: Target date: 01/12/24  Pt will be Ind in a final HEP to maintain achieved LOF  Baseline: started Goal status: INITIAL  2.  Pt will report 50% or greater improvement with R knee/thigh pain for improved function and quality of life Baseline: 3-9/10 Goal status: INITIAL  3.  Increase R hip and knee strength to 4+/5 or greater for improved functional mobility Baseline: see flow sheet Goal  status: INITIAL  4.  Improve 5xSTS by MCID of 5" as indication of improved functional mobility  Baseline: TBA Goal status: INITIAL  5.  Pt's LEFS score will improve by MCUD to 63% or greater as indication of improved function  Baseline:  Goal status: INITIAL  PLAN:  PT FREQUENCY: 2x/week  PT DURATION: 6 weeks  PLANNED INTERVENTIONS: 97164- PT Re-evaluation, 97110-Therapeutic exercises, 97530- Therapeutic activity, 97112- Neuromuscular re-education, 97535- Self Care, [id number]- Manual therapy, [id number]- Gait training, [phone]- Aquatic Therapy, [phone]- Electrical stimulation (unattended), Patient/Family education, Balance training, Stair training, Taping, Dry Needling, Joint mobilization, Cryotherapy, and Moist heat.  PLAN FOR NEXT SESSION: Assess 5xSTS; assess response to HEP; progress therex as indicated; use of modalities, manual therapy; and TPDN as indicated.   Lissandro Dilorenzo MS, PT 11/24/23 5:45 PM

## 2023-11-22 NOTE — Progress Notes (Signed)
 Right knee x-ray shows arthritis

## 2023-11-24 ENCOUNTER — Ambulatory Visit: Attending: Internal Medicine

## 2023-11-24 ENCOUNTER — Other Ambulatory Visit: Payer: Self-pay

## 2023-11-24 DIAGNOSIS — M5459 Other low back pain: Secondary | ICD-10-CM | POA: Diagnosis not present

## 2023-11-24 DIAGNOSIS — G8929 Other chronic pain: Secondary | ICD-10-CM | POA: Diagnosis not present

## 2023-11-24 DIAGNOSIS — M25561 Pain in right knee: Secondary | ICD-10-CM | POA: Diagnosis not present

## 2023-11-24 DIAGNOSIS — M6281 Muscle weakness (generalized): Secondary | ICD-10-CM | POA: Diagnosis not present

## 2023-11-24 DIAGNOSIS — M545 Low back pain, unspecified: Secondary | ICD-10-CM | POA: Insufficient documentation

## 2023-11-30 ENCOUNTER — Ambulatory Visit

## 2023-12-07 ENCOUNTER — Encounter

## 2023-12-09 ENCOUNTER — Encounter

## 2023-12-13 DIAGNOSIS — M5416 Radiculopathy, lumbar region: Secondary | ICD-10-CM | POA: Diagnosis not present

## 2023-12-13 DIAGNOSIS — Z6829 Body mass index (BMI) 29.0-29.9, adult: Secondary | ICD-10-CM | POA: Diagnosis not present

## 2023-12-14 ENCOUNTER — Encounter

## 2023-12-16 ENCOUNTER — Encounter

## 2023-12-26 ENCOUNTER — Other Ambulatory Visit: Payer: Self-pay | Admitting: Internal Medicine

## 2023-12-27 ENCOUNTER — Other Ambulatory Visit: Payer: Self-pay

## 2024-01-03 ENCOUNTER — Ambulatory Visit (INDEPENDENT_AMBULATORY_CARE_PROVIDER_SITE_OTHER): Payer: No Typology Code available for payment source | Admitting: Internal Medicine

## 2024-01-03 ENCOUNTER — Encounter: Payer: Self-pay | Admitting: Internal Medicine

## 2024-01-03 VITALS — BP 150/90 | HR 60 | Temp 98.1°F | Ht 64.0 in | Wt 174.0 lb

## 2024-01-03 DIAGNOSIS — Z125 Encounter for screening for malignant neoplasm of prostate: Secondary | ICD-10-CM | POA: Diagnosis not present

## 2024-01-03 DIAGNOSIS — F32A Depression, unspecified: Secondary | ICD-10-CM | POA: Diagnosis not present

## 2024-01-03 DIAGNOSIS — E538 Deficiency of other specified B group vitamins: Secondary | ICD-10-CM

## 2024-01-03 DIAGNOSIS — I10 Essential (primary) hypertension: Secondary | ICD-10-CM | POA: Diagnosis not present

## 2024-01-03 DIAGNOSIS — R739 Hyperglycemia, unspecified: Secondary | ICD-10-CM

## 2024-01-03 DIAGNOSIS — N1832 Chronic kidney disease, stage 3b: Secondary | ICD-10-CM

## 2024-01-03 DIAGNOSIS — M5416 Radiculopathy, lumbar region: Secondary | ICD-10-CM | POA: Diagnosis not present

## 2024-01-03 DIAGNOSIS — Z0001 Encounter for general adult medical examination with abnormal findings: Secondary | ICD-10-CM

## 2024-01-03 DIAGNOSIS — Z0000 Encounter for general adult medical examination without abnormal findings: Secondary | ICD-10-CM

## 2024-01-03 DIAGNOSIS — E559 Vitamin D deficiency, unspecified: Secondary | ICD-10-CM

## 2024-01-03 DIAGNOSIS — E7800 Pure hypercholesterolemia, unspecified: Secondary | ICD-10-CM

## 2024-01-03 LAB — BASIC METABOLIC PANEL WITH GFR
BUN: 24 mg/dL — ABNORMAL HIGH (ref 6–23)
CO2: 28 meq/L (ref 19–32)
Calcium: 9.7 mg/dL (ref 8.4–10.5)
Chloride: 105 meq/L (ref 96–112)
Creatinine, Ser: 1.55 mg/dL — ABNORMAL HIGH (ref 0.40–1.50)
GFR: 43.06 mL/min — ABNORMAL LOW (ref >60.00)
Glucose, Bld: 97 mg/dL (ref 70–99)
Potassium: 5 meq/L (ref 3.5–5.1)
Sodium: 141 meq/L (ref 135–145)

## 2024-01-03 LAB — CBC WITH DIFFERENTIAL/PLATELET
Basophils Absolute: 0.1 10*3/uL (ref 0.0–0.1)
Basophils Relative: 0.6 % (ref 0.0–3.0)
Eosinophils Absolute: 0.2 10*3/uL (ref 0.0–0.7)
Eosinophils Relative: 1.8 % (ref 0.0–5.0)
HCT: 36.5 % — ABNORMAL LOW (ref 39.0–52.0)
Hemoglobin: 12.5 g/dL — ABNORMAL LOW (ref 13.0–17.0)
Lymphocytes Relative: 38.9 % (ref 12.0–46.0)
Lymphs Abs: 4.5 10*3/uL — ABNORMAL HIGH (ref 0.7–4.0)
MCHC: 34.1 g/dL (ref 30.0–36.0)
MCV: 98.8 fl (ref 78.0–100.0)
Monocytes Absolute: 1.1 10*3/uL — ABNORMAL HIGH (ref 0.1–1.0)
Monocytes Relative: 9.1 % (ref 3.0–12.0)
Neutro Abs: 5.8 10*3/uL (ref 1.4–7.7)
Neutrophils Relative %: 49.6 % (ref 43.0–77.0)
Platelets: 487 10*3/uL — ABNORMAL HIGH (ref 150.0–400.0)
RBC: 3.69 Mil/uL — ABNORMAL LOW (ref 4.22–5.81)
RDW: 13.2 % (ref 11.5–15.5)
WBC: 11.6 10*3/uL — ABNORMAL HIGH (ref 4.0–10.5)

## 2024-01-03 LAB — URINALYSIS, ROUTINE W REFLEX MICROSCOPIC
Bilirubin Urine: NEGATIVE
Hgb urine dipstick: NEGATIVE
Ketones, ur: NEGATIVE
Leukocytes,Ua: NEGATIVE
Nitrite: NEGATIVE
RBC / HPF: NONE SEEN (ref 0–?)
Specific Gravity, Urine: 1.015 (ref 1.000–1.030)
Total Protein, Urine: NEGATIVE
Urine Glucose: NEGATIVE
Urobilinogen, UA: 0.2 (ref 0.0–1.0)
pH: 6.5 (ref 5.0–8.0)

## 2024-01-03 LAB — HEPATIC FUNCTION PANEL
ALT: 17 U/L (ref 0–53)
AST: 25 U/L (ref 0–37)
Albumin: 4.5 g/dL (ref 3.5–5.2)
Alkaline Phosphatase: 57 U/L (ref 39–117)
Bilirubin, Direct: 0.1 mg/dL (ref 0.0–0.3)
Total Bilirubin: 0.5 mg/dL (ref 0.2–1.2)
Total Protein: 7.1 g/dL (ref 6.0–8.3)

## 2024-01-03 LAB — LIPID PANEL
Cholesterol: 176 mg/dL (ref 0–200)
HDL: 38.2 mg/dL — ABNORMAL LOW (ref >39.00)
LDL Cholesterol: 88 mg/dL (ref 0–99)
NonHDL: 137.38
Total CHOL/HDL Ratio: 5
Triglycerides: 246 mg/dL — ABNORMAL HIGH (ref 0.0–149.0)
VLDL: 49.2 mg/dL — ABNORMAL HIGH (ref 0.0–40.0)

## 2024-01-03 LAB — HEMOGLOBIN A1C: Hgb A1c MFr Bld: 6.3 % (ref 4.6–6.5)

## 2024-01-03 MED ORDER — LOSARTAN POTASSIUM 100 MG PO TABS: 100.0000 mg | ORAL_TABLET | Freq: Every day | ORAL | 3 refills | Status: AC

## 2024-01-03 MED ORDER — ATORVASTATIN CALCIUM 10 MG PO TABS: 10.0000 mg | ORAL_TABLET | Freq: Every day | ORAL | 3 refills | Status: DC

## 2024-01-03 MED ORDER — PANTOPRAZOLE SODIUM 40 MG PO TBEC: 40.0000 mg | DELAYED_RELEASE_TABLET | Freq: Every day | ORAL | 3 refills | Status: AC

## 2024-01-03 NOTE — Patient Instructions (Signed)

## 2024-01-03 NOTE — Progress Notes (Signed)
 Patient ID: Clifford Bjork., male   DOB: 1946-08-11, 78 y.o.   MRN: [id number]         Chief Complaint:: wellness exam and htn , leg cramps , chornic RLE weakness/ chronic lbp, right knee OA and recurrent falls, depression, hld       HPI:  Clifford Brickell. is a 78 y.o. male here for wellness exam; plans to retire next wk, o/w up to date                        Also has occasional leg cramps in addition to lbp - Pt continues to have recurring LBP without change in severity, bowel or bladder change, fever, wt loss,  worsening LE pain/numbness/weakness, gait change or falls.  Also has right knee chronic arthritis pain and mild RLE weakness with recurrent falls x 4 in the past month,  Ran out of BP meds.  Has had extensive PT in past and does not want further.  Has had mild worsening depressive symptoms, but no suicidal ideation, or panic Pt denies chest pain, increased sob or doe, wheezing, orthopnea, PND, increased LE swelling, palpitations, dizziness or syncope.   Pt denies polydipsia, polyuria, or new focal neuro s/s.   Wt Readings from Last 3 Encounters:  01/03/24 174 lb (78.9 kg)  11/10/23 169 lb (76.7 kg)  10/27/23 165 lb (74.8 kg)   BP Readings from Last 3 Encounters:  01/03/24 (!) 150/90  11/10/23 (!) 178/88  10/27/23 112/60   Immunization History  Administered Date(s) Administered   Fluad Quad(high Dose 65+) 06/16/2019, 06/24/2021, 07/03/2022, 06/14/2023   Influenza, High Dose Seasonal PF 04/25/2018   Influenza-Unspecified 06/22/2020   PFIZER(Purple Top)SARS-COV-2 Vaccination 10/13/2019, 11/07/2019, 07/26/2020   Pneumococcal Conjugate-13 04/16/2014   Pneumococcal Polysaccharide-23 12/24/2011   Tdap 01/04/2023   Tetanus 12/24/2011   Zoster Recombinant(Shingrix) 05/04/2017, 09/22/2017   Health Maintenance Due  Topic Date Due   Medicare Annual Wellness (AWV)  01/27/2024      Past Medical History:  Diagnosis Date   Chicken pox    Erectile dysfunction    Heart murmur     Herpes simplex    HLD (hyperlipidemia) 06/16/2019   Past Surgical History:  Procedure Laterality Date   COLONOSCOPY     15+yrs ago- normal per pt.   SPLENECTOMY      reports that he has never smoked. He has never used smokeless tobacco. He reports current alcohol use of about 2.0 - 4.0 standard drinks of alcohol per week. He reports that he does not use drugs. family history includes Diabetes in his father; Pancreatic cancer in his mother. No Active Allergies Current Outpatient Medications on File Prior to Visit  Medication Sig Dispense Refill   aspirin  EC 81 MG tablet Take 1 tablet (81 mg total) by mouth daily. Swallow whole.     diphenhydrAMINE (BENADRYL) 25 mg capsule      GABAPENTIN PO Take by mouth. Take 1 tablet by mouth in the morning and two tablets at night     L-Lysine HCl 500 MG TABS      MAGNESIUM CARBONATE PO Take 1 tablet by mouth.     meclizine  (ANTIVERT ) 25 MG tablet Take 1 tablet (25 mg total) by mouth 3 (three) times daily as needed for dizziness. 30 tablet 1   methocarbamol  (ROBAXIN ) 500 MG tablet      tizanidine (ZANAFLEX) 2 MG capsule Take 2 mg by mouth 3 (three) times daily.  traMADol (ULTRAM) 50 MG tablet Take 50 mg by mouth 2 (two) times daily.     valACYclovir  (VALTREX ) 1000 MG tablet Take 1,000 mg by mouth 2 (two) times daily.     No current facility-administered medications on file prior to visit.        ROS:  All others reviewed and negative.  Objective        PE:  BP (!) 150/90 (BP Location: Left Arm, Patient Position: Sitting, Cuff Size: Normal)   Pulse 60   Temp 98.1 F (36.7 C) (Oral)   Ht 5\' 4"  (1.626 m)   Wt 174 lb (78.9 kg)   SpO2 95%   BMI 29.87 kg/m                 Constitutional: Pt appears in NAD               HENT: Head: NCAT.                Right Ear: External ear normal.                 Left Ear: External ear normal.                Eyes: . Pupils are equal, round, and reactive to light. Conjunctivae and EOM are normal                Nose: without d/c or deformity               Neck: Neck supple. Gross normal ROM               Cardiovascular: Normal rate and regular rhythm.                 Pulmonary/Chest: Effort normal and breath sounds without rales or wheezing.                Abd:  Soft, NT, ND, + BS, no organomegaly               Neurological: Pt is alert. At baseline orientation, motor 4+/5 RLE weakness               Skin: Skin is warm. No rashes, no other new lesions, LE edema - none               Psychiatric: Pt behavior is normal without agitation , deoressed affect  Micro: none  Cardiac tracings I have personally interpreted today:  none  Pertinent Radiological findings (summarize): none   Lab Results  Component Value Date   WBC 11.6 (H) 01/03/2024   HGB 12.5 (L) 01/03/2024   HCT 36.5 (L) 01/03/2024   PLT 487.0 (H) 01/03/2024   GLUCOSE 97 01/03/2024   CHOL 176 01/03/2024   TRIG 246.0 (H) 01/03/2024   HDL 38.20 (L) 01/03/2024   LDLDIRECT 53.0 06/30/2023   LDLCALC 88 01/03/2024   ALT 17 01/03/2024   AST 25 01/03/2024   NA 141 01/03/2024   K 5.0 01/03/2024   CL 105 01/03/2024   CREATININE 1.55 (H) 01/03/2024   BUN 24 (H) 01/03/2024   CO2 28 01/03/2024   TSH 1.60 01/03/2024   PSA 2.02 01/03/2024   INR 0.89 09/25/2009   HGBA1C 6.3 01/03/2024   MICROALBUR 0.7 07/08/2023   Assessment/Plan:  Clifford Applegate. is a 78 y.o. White or Caucasian [1] male with  has a past medical history of Chicken pox, Erectile dysfunction, Heart murmur, Herpes simplex, and HLD (hyperlipidemia) (  06/16/2019).  Encounter for well adult exam with abnormal findings Age and sex appropriate education and counseling updated with regular exercise and diet Referrals for preventative services - none needed Immunizations addressed - none needed Smoking counseling  - none needed Evidence for depression or other mood disorder - none significant Most recent labs reviewed. I have personally reviewed and have noted: 1)  the patient's medical and social history 2) The patient's current medications and supplements 3) The patient's height, weight, and BMI have been recorded in the chart   HLD (hyperlipidemia) Lab Results  Component Value Date   LDLCALC 88 01/03/2024   Uncontrolled, cont lipitor 10 mg , declines change today   CKD (chronic kidney disease) stage 3, GFR 30-59 ml/min sore throat Lab Results  Component Value Date   CREATININE 1.55 (H) 01/03/2024   Stable overall, cont to avoid nephrotoxins   HTN (hypertension) BP Readings from Last 3 Encounters:  01/03/24 (!) 150/90  11/10/23 (!) 178/88  10/27/23 112/60   Uncontrolled,, pt to restart losartan  100 mg qd   Lumbar radiculopathy, right Chronic with midl weakness and falls - declines PT and states he can do excercises at home  Depression Mild to mod, denies SI or HI,, declines tx at this time or counseling,  to f/u any worsening symptoms or concerns  Vitamin D  deficiency Last vitamin D  Lab Results  Component Value Date   VD25OH 37.23 01/03/2024   Low, to start oral replacement   Followup: Return in about 6 months (around 07/05/2024).  Clifford Colonel, MD 01/05/2024 5:25 AM [facility]

## 2024-01-04 ENCOUNTER — Ambulatory Visit: Payer: Self-pay | Admitting: Internal Medicine

## 2024-01-04 LAB — VITAMIN D 25 HYDROXY (VIT D DEFICIENCY, FRACTURES): VITD: 37.23 ng/mL (ref 30.00–100.00)

## 2024-01-04 LAB — TSH: TSH: 1.6 u[IU]/mL (ref 0.35–5.50)

## 2024-01-04 LAB — VITAMIN B12: Vitamin B-12: 284 pg/mL (ref 211–911)

## 2024-01-04 LAB — PSA: PSA: 2.02 ng/mL (ref 0.10–4.00)

## 2024-01-04 NOTE — Progress Notes (Signed)
 The test results show that your current treatment is OK, as the tests are stable.  Please continue the same plan.  There is no other need for change of treatment or further evaluation based on these results, at this time.  thanks

## 2024-01-05 DIAGNOSIS — F32A Depression, unspecified: Secondary | ICD-10-CM | POA: Insufficient documentation

## 2024-01-05 DIAGNOSIS — E559 Vitamin D deficiency, unspecified: Secondary | ICD-10-CM | POA: Insufficient documentation

## 2024-01-05 NOTE — Assessment & Plan Note (Signed)

## 2024-01-05 NOTE — Assessment & Plan Note (Signed)
 BP Readings from Last 3 Encounters:  01/03/24 (!) 150/90  11/10/23 (!) 178/88  10/27/23 112/60   Uncontrolled,, pt to restart losartan  100 mg qd

## 2024-01-05 NOTE — Assessment & Plan Note (Signed)
 Mild to mod, denies SI or HI,, declines tx at this time or counseling,  to f/u any worsening symptoms or concerns

## 2024-01-05 NOTE — Assessment & Plan Note (Signed)
 Last vitamin D  Lab Results  Component Value Date   VD25OH 37.23 01/03/2024   Low, to start oral replacement

## 2024-01-05 NOTE — Assessment & Plan Note (Signed)
 Lab Results  Component Value Date   LDLCALC 88 01/03/2024   Uncontrolled, cont lipitor 10 mg , declines change today

## 2024-01-05 NOTE — Assessment & Plan Note (Signed)
 sore throat Lab Results  Component Value Date   CREATININE 1.55 (H) 01/03/2024   Stable overall, cont to avoid nephrotoxins

## 2024-01-05 NOTE — Assessment & Plan Note (Signed)
 Chronic with midl weakness and falls - declines PT and states he can do excercises at home

## 2024-02-02 ENCOUNTER — Ambulatory Visit (INDEPENDENT_AMBULATORY_CARE_PROVIDER_SITE_OTHER): Payer: No Typology Code available for payment source

## 2024-02-02 VITALS — Ht 64.0 in | Wt 171.0 lb

## 2024-02-02 DIAGNOSIS — Z0000 Encounter for general adult medical examination without abnormal findings: Secondary | ICD-10-CM

## 2024-02-02 NOTE — Patient Instructions (Addendum)
 Clifford Gould , Thank you for taking time out of your busy schedule to complete your Annual Wellness Visit with me. I enjoyed our conversation and look forward to speaking with you again next year. I, as well as your care team,  appreciate your ongoing commitment to your health goals. Please review the following plan we discussed and let me know if I can assist you in the future. Your Game plan/ To Do List    Follow up Visits: Next Medicare AWV with our clinical staff: 02/05/2025   Have you seen your provider in the last 6 months (3 months if uncontrolled diabetes)? Yes Next Office Visit with your provider: 06/2024  Clinician Recommendations:  Aim for 30 minutes of exercise or brisk walking, 6-8 glasses of water, and 5 servings of fruits and vegetables each day.       This is a list of the screening recommended for you and due dates:  Health Maintenance  Topic Date Due   Flu Shot  03/17/2024   Medicare Annual Wellness Visit  02/01/2025   DTaP/Tdap/Td vaccine (2 - Td or Tdap) 01/03/2033   Hepatitis C Screening  Completed   Zoster (Shingles) Vaccine  Completed   HPV Vaccine  Aged Out   Meningitis B Vaccine  Aged Out   Pneumococcal Vaccine for age over 85  Discontinued   Colon Cancer Screening  Discontinued   COVID-19 Vaccine  Discontinued    Advanced directives: (Copy Requested) Please bring a copy of your health care power of attorney and living will to the office to be added to your chart at your convenience. You can mail to [facility] [address] [place], [place] [address] or email to [email or web address] Advance Care Planning is important because it:  [x]  Makes sure you receive the medical care that is consistent with your values, goals, and preferences  [x]  It provides guidance to your family and loved ones and reduces their decisional burden about whether or not they are making the right decisions based on your wishes.  Follow the link provided in your  after visit summary or read over the paperwork we have mailed to you to help you started getting your Advance Directives in place. If you need assistance in completing these, please reach out to us  so that we can help you!

## 2024-02-02 NOTE — Progress Notes (Addendum)
 Subjective:   Clifford Gould. is a 78 y.o. who presents for a Medicare Wellness preventive visit.  As a reminder, Annual Wellness Visits don't include a physical exam, and some assessments may be limited, especially if this visit is performed virtually. We may recommend an in-person follow-up visit with your provider if needed.  Visit Complete: Virtual I connected with  Clifford Gould. on 02/02/24 by a audio enabled telemedicine application and verified that I am speaking with the correct person using two identifiers.  Patient Location: Home  Provider Location: Office/Clinic  I discussed the limitations of evaluation and management by telemedicine. The patient expressed understanding and agreed to proceed.  Vital Signs: Because this visit was a virtual/telehealth visit, some criteria may be missing or patient reported. Any vitals not documented were not able to be obtained and vitals that have been documented are patient reported.  VideoDeclined- This patient declined Librarian, academic. Therefore the visit was completed with audio only.  Persons Participating in Visit: Patient.  AWV Questionnaire: No: Patient Medicare AWV questionnaire was not completed prior to this visit.  Cardiac Risk Factors include: advanced age (>14men, >50 women);hypertension;dyslipidemia;male gender     Objective:    Today's Vitals   02/02/24 1544  Weight: 171 lb (77.6 kg)  Height: 5' 4 (1.626 m)   Body mass index is 29.35 kg/m.     02/02/2024    3:44 PM 11/24/2023    5:10 PM 10/18/2023    4:01 AM 01/27/2023    4:06 PM 01/08/2022   11:00 AM 01/07/2021    4:26 PM 12/11/2019    8:41 AM  Advanced Directives  Does Patient Have a Medical Advance Directive? Yes No No Yes Yes No Yes  Type of Estate agent of [place];Living will   Healthcare Power of [place];Living will Living will;Healthcare Power of [employer] Power of [place];Living  will  Does patient want to make changes to medical advance directive?     No - Patient declined  No - Patient declined  Copy of Healthcare Power of Attorney in Chart? No - copy requested   No - copy requested No - copy requested  No - copy requested  Would patient like information on creating a medical advance directive?  No - Patient declined    No - Patient declined     Current Medications (verified) Outpatient Encounter Medications as of 02/02/2024  Medication Sig   aspirin  EC 81 MG tablet Take 1 tablet (81 mg total) by mouth daily. Swallow whole.   atorvastatin  (LIPITOR) 10 MG tablet Take 1 tablet (10 mg total) by mouth daily.   diphenhydrAMINE (BENADRYL) 25 mg capsule    GABAPENTIN PO Take by mouth. Take 1 tablet by mouth in the morning and two tablets at night   L-Lysine HCl 500 MG TABS    losartan  (COZAAR ) 100 MG tablet Take 1 tablet (100 mg total) by mouth daily.   MAGNESIUM CARBONATE PO Take 1 tablet by mouth.   meclizine  (ANTIVERT ) 25 MG tablet Take 1 tablet (25 mg total) by mouth 3 (three) times daily as needed for dizziness.   pantoprazole  (PROTONIX ) 40 MG tablet Take 1 tablet (40 mg total) by mouth daily.   tiZANidine (ZANAFLEX) 4 MG capsule Take 4 mg by mouth once. Patient stated no longer takes 2mg    valACYclovir  (VALTREX ) 1000 MG tablet Take 1,000 mg by mouth 2 (two) times daily.   methocarbamol  (ROBAXIN ) 500 MG tablet  (Patient  not taking: Reported on 02/02/2024)   traMADol (ULTRAM) 50 MG tablet Take 50 mg by mouth 2 (two) times daily. (Patient not taking: Reported on 02/02/2024)   [DISCONTINUED] tizanidine (ZANAFLEX) 2 MG capsule Take 2 mg by mouth 3 (three) times daily.   No facility-administered encounter medications on file as of 02/02/2024.    Allergies (verified) Patient has no active allergies.   History: Past Medical History:  Diagnosis Date   Chicken pox    Erectile dysfunction    Heart murmur    Herpes simplex    HLD (hyperlipidemia) 06/16/2019   Past  Surgical History:  Procedure Laterality Date   COLONOSCOPY     15+yrs ago- normal per pt.   SPLENECTOMY     Family History  Problem Relation Age of Onset   Pancreatic cancer Mother        pancreatic   Diabetes Father    Colon cancer Neg Hx    Social History   Socioeconomic History   Marital status: Widowed    Spouse name: Not on file   Number of children: 3   Years of education: 16   Highest education level: Bachelor's degree (e.g., BA, AB, BS)  Occupational History   Occupation: Nurse Aide   Tobacco Use   Smoking status: Never   Smokeless tobacco: Never  Vaping Use   Vaping status: Never Used  Substance and Sexual Activity   Alcohol use: Yes    Alcohol/week: 2.0 - 4.0 standard drinks of alcohol    Types: 2 - 4 Cans of beer per week    Comment: occ beer   Drug use: No   Sexual activity: Never  Other Topics Concern   Not on file  Social History Narrative   Born and raised in [place], [place]. Currently resides in a house with his wife. 2 dogs (1 boxer, half-lab/pitbull). Fun: Play golf, tennis and read.    Denies religious beliefs that would effect health care.    Social Drivers of Corporate investment banker Strain: Low Risk  (02/02/2024)   Overall Financial Resource Strain (CARDIA)    Difficulty of Paying Living Expenses: Not hard at all  Food Insecurity: No Food Insecurity (02/02/2024)   Hunger Vital Sign    Worried About Running Out of Food in the Last Year: Never true    Ran Out of Food in the Last Year: Never true  Transportation Needs: No Transportation Needs (02/02/2024)   PRAPARE - Administrator, Civil Service (Medical): No    Lack of Transportation (Non-Medical): No  Physical Activity: Sufficiently Active (02/02/2024)   Exercise Vital Sign    Days of Exercise per Week: 3 days    Minutes of Exercise per Session: 50 min  Stress: No Stress Concern Present (02/02/2024)   [employer] of Occupational Health - Occupational Stress Questionnaire     Feeling of Stress: Not at all  Social Connections: Socially Isolated (02/02/2024)   Social Connection and Isolation Panel    Frequency of Communication with Friends and Family: More than three times a week    Frequency of Social Gatherings with Friends and Family: Once a week    Attends Religious Services: Never    Database administrator or Organizations: No    Attends Banker Meetings: Never    Marital Status: Widowed    Tobacco Counseling Counseling given: Not Answered    Clinical Intake:  Pre-visit preparation completed: Yes  Pain : No/denies pain  BMI - recorded: 29.35 Nutritional Status: BMI 25 -29 Overweight Nutritional Risks: None Diabetes: No  Lab Results  Component Value Date   HGBA1C 6.3 01/03/2024   HGBA1C 6.2 07/08/2023   HGBA1C 6.2 06/30/2023     How often do you need to have someone help you when you read instructions, pamphlets, or other written materials from your doctor or pharmacy?: 1 - Never  Interpreter Needed?: No  Information entered by :: Kandy Orris, CMA   Activities of Daily Living     02/02/2024    3:48 PM  In your present state of health, do you have any difficulty performing the following activities:  Hearing? 0  Vision? 0  Difficulty concentrating or making decisions? 0  Walking or climbing stairs? 0  Dressing or bathing? 0  Doing errands, shopping? 0  Preparing Food and eating ? N  Using the Toilet? N  In the past six months, have you accidently leaked urine? N  Do you have problems with loss of bowel control? N  Managing your Medications? N  Managing your Finances? N  Housekeeping or managing your Housekeeping? N    Patient Care Team: Roslyn Coombe, MD as PCP - General (Internal Medicine) Dorean Gambles, OD (Optometry) Eva Hikes, MD as Referring Physician (Dermatology)  I have updated your Care Teams any recent Medical Services you may have received from other providers in the past year.      Assessment:   This is a routine wellness examination for Clifford Gould.  Hearing/Vision screen Hearing Screening - Comments:: Denies hearing difficulties   Vision Screening - Comments:: Wears rx glasses - up to date with routine eye exams with Dr Rochelle Chu   Goals Addressed               This Visit's Progress     Patient Stated (pt-stated)        Patient stated he wants to get his leg strengthen more - [facility] at the [facility]        Depression Screen     02/02/2024    3:50 PM 01/03/2024    8:43 AM 10/21/2023   10:30 AM 07/05/2023    8:34 AM 01/27/2023    4:22 PM 01/04/2023    8:13 AM 07/03/2022    8:58 AM  PHQ 2/9 Scores  PHQ - 2 Score 0 0 0 0 0 0 0  PHQ- 9 Score 3    2 2      Fall Risk     02/02/2024    3:49 PM 01/03/2024    8:47 AM 10/21/2023   10:35 AM 07/05/2023    8:34 AM 01/27/2023    4:07 PM  Fall Risk   Falls in the past year? 1 1 0 0 0  Number falls in past yr: 1 1 0 0 0  Comment 4      Injury with Fall? 0 0 0 0 0  Risk for fall due to : Impaired balance/gait History of fall(s) No Fall Risks No Fall Risks No Fall Risks  Follow up Falls evaluation completed;Falls prevention discussed Falls evaluation completed Falls evaluation completed Falls evaluation completed Falls prevention discussed    MEDICARE RISK AT HOME:  Medicare Risk at Home Any stairs in or around the home?: Yes (outside) If so, are there any without handrails?: No (no handrail) Home free of loose throw rugs in walkways, pet beds, electrical cords, etc?: Yes Adequate lighting in your home to reduce risk of falls?: Yes Life alert?: No Use  of a cane, walker or w/c?: No Grab bars in the bathroom?: Yes Shower chair or bench in shower?: No Elevated toilet seat or a handicapped toilet?: No  TIMED UP AND GO:  Was the test performed?  No  Cognitive Function: 6CIT completed        02/02/2024    3:53 PM 01/27/2023    4:08 PM 01/08/2022   11:02 AM  6CIT Screen  What Year? 0 points 0 points 0 points   What month? 0 points 0 points 0 points  What time? 0 points 0 points 0 points  Count back from 20 0 points 0 points 0 points  Months in reverse 0 points 0 points 0 points  Repeat phrase 0 points 0 points 0 points  Total Score 0 points 0 points 0 points    Immunizations Immunization History  Administered Date(s) Administered   Fluad Quad(high Dose 65+) 06/16/2019, 06/24/2021, 07/03/2022, 06/14/2023   Influenza, High Dose Seasonal PF 04/25/2018   Influenza-Unspecified 06/22/2020   PFIZER(Purple Top)SARS-COV-2 Vaccination 10/13/2019, 11/07/2019, 07/26/2020   Pneumococcal Conjugate-13 04/16/2014   Pneumococcal Polysaccharide-23 12/24/2011   Tdap 01/04/2023   Tetanus 12/24/2011   Zoster Recombinant(Shingrix) 05/04/2017, 09/22/2017    Screening Tests Health Maintenance  Topic Date Due   INFLUENZA VACCINE  03/17/2024   Medicare Annual Wellness (AWV)  02/01/2025   DTaP/Tdap/Td (2 - Td or Tdap) 01/03/2033   Hepatitis C Screening  Completed   Zoster Vaccines- Shingrix  Completed   HPV VACCINES  Aged Out   Meningococcal B Vaccine  Aged Out   Pneumococcal Vaccine: 50+ Years  Discontinued   Colonoscopy  Discontinued   COVID-19 Vaccine  Discontinued    Health Maintenance  There are no preventive care reminders to display for this patient. Health Maintenance Items Addressed: 02/02/2024   Additional Screening:  Vision Screening: Recommended annual ophthalmology exams for early detection of glaucoma and other disorders of the eye. Would you like a referral to an eye doctor? No  Pt stated he's had an annual eye exam with Dr Rochelle Chu in 2025.  Dental Screening: Recommended annual dental exams for proper oral hygiene  Community Resource Referral / Chronic Care Management: CRR required this visit?  No   CCM required this visit?  No   Plan:    I have personally reviewed and noted the following in the patient's chart:   Medical and social history Use of alcohol, tobacco or  illicit drugs  Current medications and supplements including opioid prescriptions. Patient is not currently taking opioid prescriptions. Functional ability and status Nutritional status Physical activity Advanced directives List of other physicians Hospitalizations, surgeries, and ER visits in previous 12 months Vitals Screenings to include cognitive, depression, and falls Referrals and appointments  In addition, I have reviewed and discussed with patient certain preventive protocols, quality metrics, and best practice recommendations. A written personalized care plan for preventive services as well as general preventive health recommendations were provided to patient.   Patria Bookbinder, CMA   02/02/2024   After Visit Summary: (MyChart) Due to this being a telephonic visit, the after visit summary with patients personalized plan was offered to patient via MyChart   Notes: Nothing significant to report at this time.

## 2024-02-15 ENCOUNTER — Ambulatory Visit: Payer: Self-pay | Admitting: Family Medicine

## 2024-02-15 NOTE — Telephone Encounter (Signed)
 Forwarding to Dr. Denyse Amass to review and advise.

## 2024-02-16 ENCOUNTER — Telehealth: Payer: Self-pay

## 2024-02-16 NOTE — Telephone Encounter (Signed)
 I think I would certainly be more comfortable with you doing the procedure. I was not covered by Medicare as they said they were out of network. I also wasn't planning on doing the 3 shot process until July 14th as I will be out of town until then   If I am not covered, I still want to try it.  If you can give me a cost estimate, that would be great.  Thank for the quick response.   Clifford Gould DG Knee AP/LAT W/Sunrise Right [name] RAMAN, MD to Clifford Gould. Clifford Gould     02/16/24  7:14 AM There are multiple ways to get x-rays of the knee.  We can do anything from a simple straightahead x-ray of both knees to 5 x-rays of each knee.  It really depends on what you are trying to look for.  The x-ray that I obtained of your right knee in March did show a fair amount of arthritis.  You had a steroid injection on not quite sure how long that lasted.  Typically if the steroid/cortisone injection does not last long enough we will move to the gel injections.  The gel injections series is what it sounds like the other clinic that you went to is planning on doing.  I am certainly happy to do that with you here if you would like or you could get the gel injection at the other clinic.  If you do want us  to get the gel injections authorized please let us  know ahead of time so that we have time to complete the prior authorization process.  Last read by Clifford Gould. Clifford Gould at 7:16AM on 02/16/2024. DG Knee AP/LAT W/Sunrise Right

## 2024-02-16 NOTE — Telephone Encounter (Signed)
 Can we check benefits for Visco for Bilat knee OA.

## 2024-02-16 NOTE — Telephone Encounter (Signed)
 Started pre certification  Request [id number]  Waiting on faxed response

## 2024-02-17 NOTE — Telephone Encounter (Signed)
 Orthovisc is non-preferred switched to Synvisc series over the phone with Candyce from [employer] same pending auth #

## 2024-02-24 NOTE — Telephone Encounter (Signed)
 Can you schedule patient for the synvisc series once medication is stocked thank you   Synvisc authorized for bilateral knee AUTH NUMBER [id number] 02/16/24-02/15/25

## 2024-02-25 NOTE — Telephone Encounter (Signed)
 Patient self scheduled through MyChart on 7/16. Synvisc Series has been ordered. Patient is aware appointment may have to be moved if medication has not arrived in time.  Just fyi.

## 2024-02-28 NOTE — Telephone Encounter (Signed)
 Noted! Thank you

## 2024-03-01 ENCOUNTER — Ambulatory Visit: Admitting: Family Medicine

## 2024-03-01 ENCOUNTER — Other Ambulatory Visit: Payer: Self-pay

## 2024-03-01 ENCOUNTER — Encounter: Payer: Self-pay | Admitting: Family Medicine

## 2024-03-01 VITALS — BP 136/78 | HR 76 | Ht 64.0 in | Wt 176.0 lb

## 2024-03-01 DIAGNOSIS — M170 Bilateral primary osteoarthritis of knee: Secondary | ICD-10-CM

## 2024-03-01 DIAGNOSIS — G8929 Other chronic pain: Secondary | ICD-10-CM | POA: Diagnosis not present

## 2024-03-01 DIAGNOSIS — M25561 Pain in right knee: Secondary | ICD-10-CM

## 2024-03-01 DIAGNOSIS — M25562 Pain in left knee: Secondary | ICD-10-CM | POA: Diagnosis not present

## 2024-03-01 MED ORDER — HYLAN G-F 20 16 MG/2ML IX SOSY
16.0000 mg | PREFILLED_SYRINGE | Freq: Once | INTRA_ARTICULAR | Status: AC
  Administered 2024-03-01: 16 mg via INTRA_ARTICULAR

## 2024-03-01 NOTE — Patient Instructions (Addendum)
 Thank you for coming in today.   You received an injection today. Seek immediate medical attention if the joint becomes red, extremely painful, or is oozing fluid.   See you back in 1 week for Synvisc #2 of 3, bilateral knees

## 2024-03-01 NOTE — Progress Notes (Unsigned)
 I, Leotis Batter, CMA acting as a scribe for Artist Lloyd, MD.  Clifford Gould. is a 78 y.o. male who presents to [facility] today for exacerbation of his R knee pain. Pt was last seen by Dr. Lloyd on 11/10/23 and was given a R knee steroid injection. Pt later exchanged MyChart messages and was encouraged to try PT, but he only completed one visit.  Today, pt reports some improvement of knee sx since fall in Feb 2025. 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 Notes R>L knee pain. Denies visible swelling. Mechanical sx present on left. Taking Tylenol  Arthritis and using Voltaren prn. Also taking MSM and Instaflex.   Dx imaging: 11/10/23 R knee XR 10/18/23 L-spine MRI   Pertinent review of systems: No fevers or chills  Relevant historical information: Hypertension   Exam:  BP 136/78   Pulse 76   Ht 5' 4 (1.626 m)   Wt 176 lb (79.8 kg)   PF 91 L/min   BMI 30.21 kg/m  General: Well Developed, well nourished, and in no acute distress.   MSK: Knees bilaterally mild effusion.  Normal motion.    Lab and Radiology Results  Stacie Knutzen. presents to clinic today for Synvisc injection bilateral knee 1/3 Procedure: Real-time Ultrasound Guided Injection of right knee joint superior lateral patellar space Device: Philips Affiniti 50G/GE Logiq Images permanently stored and available for review in PACS Verbal informed consent obtained.  Discussed risks and benefits of procedure. Warned about infection, bleeding, damage to structures among others. Patient expresses understanding and agreement Time-out conducted.   Noted no overlying erythema, induration, or other signs of local infection.   Skin prepped in a sterile fashion.   Local anesthesia: Topical Ethyl chloride.   With sterile technique and under real time ultrasound guidance: Synvisc 2 mL injected into knee joint. Fluid seen entering the joint capsule.   Completed without difficulty   Advised  to call if fevers/chills, erythema, induration, drainage, or persistent bleeding.   Images permanently stored and available for review in the ultrasound unit.  Impression: Technically successful ultrasound guided injection.  Procedure: Real-time Ultrasound Guided Injection of left knee joint superior lateral patellar space Device: Philips Affiniti 50G/GE Logiq Images permanently stored and available for review in PACS Verbal informed consent obtained.  Discussed risks and benefits of procedure. Warned about infection, bleeding, damage to structures among others. Patient expresses understanding and agreement Time-out conducted.   Noted no overlying erythema, induration, or other signs of local infection.   Skin prepped in a sterile fashion.   Local anesthesia: Topical Ethyl chloride.   With sterile technique and under real time ultrasound guidance: Synvisc 2 mL injected into knee joint. Fluid seen entering the joint capsule.   Completed without difficulty   Advised to call if fevers/chills, erythema, induration, drainage, or persistent bleeding.   Images permanently stored and available for review in the ultrasound unit.  Impression: Technically successful ultrasound guided injection. Lot number: [id number]      Assessment and Plan: 78 y.o. male with bilateral knee pain due to osteoarthritis.  Plan for Synvisc injection today.  Return in 1 week for Synvisc injection bilateral knees 2/3   PDMP not reviewed this encounter. Orders Placed This Encounter  Procedures   US  LIMITED JOINT SPACE STRUCTURES LOW BILAT(NO LINKED CHARGES)    Reason for Exam (SYMPTOM  OR DIAGNOSIS REQUIRED):   bilat knee pain    Preferred imaging location?:   [facility]  Meds ordered this encounter  Medications   hylan (SYNVISC) intra-articular injection 16 mg   hylan (SYNVISC) intra-articular injection 16 mg     Discussed warning signs or symptoms. Please see discharge  instructions. Patient expresses understanding.   The above documentation has been reviewed and is accurate and complete Artist Lloyd, M.D.

## 2024-03-08 ENCOUNTER — Ambulatory Visit (INDEPENDENT_AMBULATORY_CARE_PROVIDER_SITE_OTHER): Admitting: Family Medicine

## 2024-03-08 ENCOUNTER — Other Ambulatory Visit: Payer: Self-pay

## 2024-03-08 DIAGNOSIS — M25562 Pain in left knee: Secondary | ICD-10-CM

## 2024-03-08 DIAGNOSIS — G8929 Other chronic pain: Secondary | ICD-10-CM | POA: Diagnosis not present

## 2024-03-08 DIAGNOSIS — M170 Bilateral primary osteoarthritis of knee: Secondary | ICD-10-CM | POA: Diagnosis not present

## 2024-03-08 DIAGNOSIS — M25561 Pain in right knee: Secondary | ICD-10-CM | POA: Diagnosis not present

## 2024-03-08 MED ORDER — HYLAN G-F 20 16 MG/2ML IX SOSY
16.0000 mg | PREFILLED_SYRINGE | Freq: Once | INTRA_ARTICULAR | Status: AC
  Administered 2024-03-08: 16 mg via INTRA_ARTICULAR

## 2024-03-08 NOTE — Progress Notes (Signed)
 Clifford Gould. presents to clinic today for Synvisc injection bilateral knee 2/3 Procedure: Real-time Ultrasound Guided Injection of right knee joint superior lateral patellar space Device: Philips Affiniti 50G/GE Logiq Images permanently stored and available for review in PACS Verbal informed consent obtained.  Discussed risks and benefits of procedure. Warned about infection, bleeding, damage to structures among others. Patient expresses understanding and agreement Time-out conducted.   Noted no overlying erythema, induration, or other signs of local infection.   Skin prepped in a sterile fashion.   Local anesthesia: Topical Ethyl chloride.   With sterile technique and under real time ultrasound guidance: Synvisc 2 mL injected into knee joint. Fluid seen entering the joint capsule.   Completed without difficulty   Advised to call if fevers/chills, erythema, induration, drainage, or persistent bleeding.   Images permanently stored and available for review in the ultrasound unit.  Impression: Technically successful ultrasound guided injection.  Procedure: Real-time Ultrasound Guided Injection of left knee joint superior lateral patellar space Device: Philips Affiniti 50G/GE Logiq Images permanently stored and available for review in PACS Verbal informed consent obtained.  Discussed risks and benefits of procedure. Warned about infection, bleeding, damage to structures among others. Patient expresses understanding and agreement Time-out conducted.   Noted no overlying erythema, induration, or other signs of local infection.   Skin prepped in a sterile fashion.   Local anesthesia: Topical Ethyl chloride.   With sterile technique and under real time ultrasound guidance: Synvisc 2 mL injected into knee joint. Fluid seen entering the joint capsule.   Completed without difficulty   Advised to call if fevers/chills, erythema, induration, drainage, or persistent bleeding.   Images  permanently stored and available for review in the ultrasound unit.  Impression: Technically successful ultrasound guided injection. Lot number: [id number] Return in 1 week for Synvisc injection bilateral knees 3/3

## 2024-03-08 NOTE — Patient Instructions (Addendum)
Thank you for coming in today.   Call or go to the ER if you develop a large red swollen joint with extreme pain or oozing puss.   

## 2024-03-15 ENCOUNTER — Ambulatory Visit (INDEPENDENT_AMBULATORY_CARE_PROVIDER_SITE_OTHER): Admitting: Family Medicine

## 2024-03-15 ENCOUNTER — Other Ambulatory Visit: Payer: Self-pay

## 2024-03-15 DIAGNOSIS — M25562 Pain in left knee: Secondary | ICD-10-CM

## 2024-03-15 DIAGNOSIS — M170 Bilateral primary osteoarthritis of knee: Secondary | ICD-10-CM | POA: Diagnosis not present

## 2024-03-15 DIAGNOSIS — G8929 Other chronic pain: Secondary | ICD-10-CM

## 2024-03-15 DIAGNOSIS — M25561 Pain in right knee: Secondary | ICD-10-CM

## 2024-03-15 MED ORDER — HYLAN G-F 20 16 MG/2ML IX SOSY
16.0000 mg | PREFILLED_SYRINGE | Freq: Once | INTRA_ARTICULAR | Status: AC
  Administered 2024-03-15: 16 mg via INTRA_ARTICULAR

## 2024-03-15 NOTE — Progress Notes (Signed)
 Clifford Gould. presents to clinic today for Synvisc injection bilateral knee 3/3 Procedure: Real-time Ultrasound Guided Injection of right knee joint superior lateral patellar space Device: Philips Affiniti 50G/GE Logiq Images permanently stored and available for review in PACS Verbal informed consent obtained.  Discussed risks and benefits of procedure. Warned about infection, bleeding, damage to structures among others. Patient expresses understanding and agreement Time-out conducted.   Noted no overlying erythema, induration, or other signs of local infection.   Skin prepped in a sterile fashion.   Local anesthesia: Topical Ethyl chloride.   With sterile technique and under real time ultrasound guidance: Synvisc 2 mL injected into knee joint. Fluid seen entering the joint capsule.   Completed without difficulty   Advised to call if fevers/chills, erythema, induration, drainage, or persistent bleeding.   Images permanently stored and available for review in the ultrasound unit.  Impression: Technically successful ultrasound guided injection.  Procedure: Real-time Ultrasound Guided Injection of left knee joint superior lateral patellar space Device: Philips Affiniti 50G/GE Logiq Images permanently stored and available for review in PACS Verbal informed consent obtained.  Discussed risks and benefits of procedure. Warned about infection, bleeding, damage to structures among others. Patient expresses understanding and agreement Time-out conducted.   Noted no overlying erythema, induration, or other signs of local infection.   Skin prepped in a sterile fashion.   Local anesthesia: Topical Ethyl chloride.   With sterile technique and under real time ultrasound guidance: Synvisc 2 mL injected into knee joint. Fluid seen entering the joint capsule.   Completed without difficulty   Advised to call if fevers/chills, erythema, induration, drainage, or persistent bleeding.   Images  permanently stored and available for review in the ultrasound unit.  Impression: Technically successful ultrasound guided injection. Lot number: [id number]  Return as needed.  We also talked a bit about PRP.  Could consider that in the future if needed.  So far he is getting good results with Synvisc.

## 2024-03-15 NOTE — Patient Instructions (Signed)
 Thank you for coming in today.   You received an injection today. Seek immediate medical attention if the joint becomes red, extremely painful, or is oozing fluid.

## 2024-04-08 DIAGNOSIS — Z6830 Body mass index (BMI) 30.0-30.9, adult: Secondary | ICD-10-CM | POA: Diagnosis not present

## 2024-04-08 DIAGNOSIS — Z9181 History of falling: Secondary | ICD-10-CM | POA: Diagnosis not present

## 2024-04-08 DIAGNOSIS — Z008 Encounter for other general examination: Secondary | ICD-10-CM | POA: Diagnosis not present

## 2024-04-08 DIAGNOSIS — R269 Unspecified abnormalities of gait and mobility: Secondary | ICD-10-CM | POA: Diagnosis not present

## 2024-04-08 DIAGNOSIS — R7303 Prediabetes: Secondary | ICD-10-CM | POA: Diagnosis not present

## 2024-04-08 DIAGNOSIS — E669 Obesity, unspecified: Secondary | ICD-10-CM | POA: Diagnosis not present

## 2024-06-30 ENCOUNTER — Encounter: Payer: Self-pay | Admitting: Internal Medicine

## 2024-06-30 DIAGNOSIS — E559 Vitamin D deficiency, unspecified: Secondary | ICD-10-CM

## 2024-06-30 DIAGNOSIS — I10 Essential (primary) hypertension: Secondary | ICD-10-CM

## 2024-06-30 DIAGNOSIS — R739 Hyperglycemia, unspecified: Secondary | ICD-10-CM

## 2024-07-03 ENCOUNTER — Other Ambulatory Visit (INDEPENDENT_AMBULATORY_CARE_PROVIDER_SITE_OTHER)

## 2024-07-03 DIAGNOSIS — I10 Essential (primary) hypertension: Secondary | ICD-10-CM | POA: Diagnosis not present

## 2024-07-03 DIAGNOSIS — E559 Vitamin D deficiency, unspecified: Secondary | ICD-10-CM | POA: Diagnosis not present

## 2024-07-03 DIAGNOSIS — R739 Hyperglycemia, unspecified: Secondary | ICD-10-CM

## 2024-07-03 LAB — CBC WITH DIFFERENTIAL/PLATELET
Basophils Absolute: 0.1 K/uL (ref 0.0–0.1)
Basophils Relative: 0.8 % (ref 0.0–3.0)
Eosinophils Absolute: 0.3 K/uL (ref 0.0–0.7)
Eosinophils Relative: 2.9 % (ref 0.0–5.0)
HCT: 35.9 % — ABNORMAL LOW (ref 39.0–52.0)
Hemoglobin: 12.2 g/dL — ABNORMAL LOW (ref 13.0–17.0)
Lymphocytes Relative: 47.3 % — ABNORMAL HIGH (ref 12.0–46.0)
Lymphs Abs: 4.9 K/uL — ABNORMAL HIGH (ref 0.7–4.0)
MCHC: 34 g/dL (ref 30.0–36.0)
MCV: 97.9 fl (ref 78.0–100.0)
Monocytes Absolute: 1 K/uL (ref 0.1–1.0)
Monocytes Relative: 9.9 % (ref 3.0–12.0)
Neutro Abs: 4.1 K/uL (ref 1.4–7.7)
Neutrophils Relative %: 39.1 % — ABNORMAL LOW (ref 43.0–77.0)
Platelets: 415 K/uL — ABNORMAL HIGH (ref 150.0–400.0)
RBC: 3.67 Mil/uL — ABNORMAL LOW (ref 4.22–5.81)
RDW: 13.3 % (ref 11.5–15.5)
WBC: 10.4 K/uL (ref 4.0–10.5)

## 2024-07-03 LAB — HEPATIC FUNCTION PANEL
ALT: 18 U/L (ref 0–53)
AST: 26 U/L (ref 0–37)
Albumin: 4.3 g/dL (ref 3.5–5.2)
Alkaline Phosphatase: 68 U/L (ref 39–117)
Bilirubin, Direct: 0.1 mg/dL (ref 0.0–0.3)
Total Bilirubin: 0.6 mg/dL (ref 0.2–1.2)
Total Protein: 7.2 g/dL (ref 6.0–8.3)

## 2024-07-03 LAB — BASIC METABOLIC PANEL WITH GFR
BUN: 18 mg/dL (ref 6–23)
CO2: 24 meq/L (ref 19–32)
Calcium: 9 mg/dL (ref 8.4–10.5)
Chloride: 105 meq/L (ref 96–112)
Creatinine, Ser: 1.43 mg/dL (ref 0.40–1.50)
GFR: 47.27 mL/min — ABNORMAL LOW (ref >60.00)
Glucose, Bld: 87 mg/dL (ref 70–99)
Potassium: 4.2 meq/L (ref 3.5–5.1)
Sodium: 139 meq/L (ref 135–145)

## 2024-07-03 LAB — LIPID PANEL
Cholesterol: 152 mg/dL (ref 0–200)
HDL: 34.9 mg/dL — ABNORMAL LOW (ref >39.00)
LDL Cholesterol: 56 mg/dL (ref 0–99)
NonHDL: 117.25
Total CHOL/HDL Ratio: 4
Triglycerides: 307 mg/dL — ABNORMAL HIGH (ref 0.0–149.0)
VLDL: 61.4 mg/dL — ABNORMAL HIGH (ref 0.0–40.0)

## 2024-07-03 LAB — VITAMIN D 25 HYDROXY (VIT D DEFICIENCY, FRACTURES): VITD: 34.13 ng/mL (ref 30.00–100.00)

## 2024-07-04 LAB — HEMOGLOBIN A1C: Hgb A1c MFr Bld: 6.3 % (ref 4.6–6.5)

## 2024-07-05 ENCOUNTER — Ambulatory Visit: Admitting: Internal Medicine

## 2024-07-05 ENCOUNTER — Encounter: Payer: Self-pay | Admitting: Internal Medicine

## 2024-07-05 VITALS — BP 144/82 | HR 67 | Temp 98.7°F | Ht 64.0 in | Wt 178.0 lb

## 2024-07-05 DIAGNOSIS — I10 Essential (primary) hypertension: Secondary | ICD-10-CM | POA: Diagnosis not present

## 2024-07-05 DIAGNOSIS — E559 Vitamin D deficiency, unspecified: Secondary | ICD-10-CM | POA: Diagnosis not present

## 2024-07-05 DIAGNOSIS — N1832 Chronic kidney disease, stage 3b: Secondary | ICD-10-CM | POA: Diagnosis not present

## 2024-07-05 DIAGNOSIS — E538 Deficiency of other specified B group vitamins: Secondary | ICD-10-CM

## 2024-07-05 DIAGNOSIS — E7800 Pure hypercholesterolemia, unspecified: Secondary | ICD-10-CM | POA: Diagnosis not present

## 2024-07-05 DIAGNOSIS — R739 Hyperglycemia, unspecified: Secondary | ICD-10-CM

## 2024-07-05 NOTE — Assessment & Plan Note (Signed)
 Lab Results  Component Value Date   LDLCALC 56 07/03/2024   Stable, pt to continue current statin lipitor 10 mg qd

## 2024-07-05 NOTE — Progress Notes (Signed)
 Patient ID: Clifford LULLA Correne Mickey., male   DOB: 20-Jun-1946, 78 y.o.   MRN: [id number]        Chief Complaint: follow up htn, hld, ckd3a, low vit d,        HPI:  Clifford Gould. is a 78 y.o. male here overall doing ok,  Pt denies chest pain, increased sob or doe, wheezing, orthopnea, PND, increased LE swelling, palpitations, dizziness or syncope.   Pt denies polydipsia, polyuria, or new focal neuro s/s.    Pt denies fever, wt loss, night sweats, loss of appetite, or other constitutional symptoms  BP has been controlled at home per pt.  Had recent gel shots to the knees with pain well controlled now.  Plays golf and pickleball.  Sees derm yearly.  Wt Readings from Last 3 Encounters:  07/05/24 178 lb (80.7 kg)  03/01/24 176 lb (79.8 kg)  02/02/24 171 lb (77.6 kg)   BP Readings from Last 3 Encounters:  07/05/24 (!) 144/82  03/01/24 136/78  01/03/24 (!) 150/90         Past Medical History:  Diagnosis Date   Chicken pox    Erectile dysfunction    Heart murmur    Herpes simplex    HLD (hyperlipidemia) 06/16/2019   Past Surgical History:  Procedure Laterality Date   COLONOSCOPY     15+yrs ago- normal per pt.   SPLENECTOMY      reports that he has never smoked. He has never used smokeless tobacco. He reports current alcohol use of about 2.0 - 4.0 standard drinks of alcohol per week. He reports that he does not use drugs. family history includes Diabetes in his father; Pancreatic cancer in his mother. No Active Allergies Current Outpatient Medications on File Prior to Visit  Medication Sig Dispense Refill   aspirin  EC 81 MG tablet Take 1 tablet (81 mg total) by mouth daily. Swallow whole.     atorvastatin  (LIPITOR) 10 MG tablet Take 1 tablet (10 mg total) by mouth daily. 90 tablet 3   GABAPENTIN PO Take by mouth. Take 1 tablet by mouth in the morning and two tablets at night     L-Lysine HCl 500 MG TABS      losartan  (COZAAR ) 100 MG tablet Take 1 tablet (100 mg total) by mouth daily.  90 tablet 3   MAGNESIUM CARBONATE PO Take 1 tablet by mouth.     meclizine  (ANTIVERT ) 25 MG tablet Take 1 tablet (25 mg total) by mouth 3 (three) times daily as needed for dizziness. 30 tablet 1   pantoprazole  (PROTONIX ) 40 MG tablet Take 1 tablet (40 mg total) by mouth daily. 90 tablet 3   valACYclovir  (VALTREX ) 1000 MG tablet Take 1,000 mg by mouth 2 (two) times daily.     No current facility-administered medications on file prior to visit.        ROS:  All others reviewed and negative.  Objective        PE:  BP (!) 144/82 (BP Location: Right Arm, Patient Position: Sitting, Cuff Size: Normal)   Pulse 67   Temp 98.7 F (37.1 C) (Oral)   Ht 5' 4 (1.626 m)   Wt 178 lb (80.7 kg)   SpO2 95%   BMI 30.55 kg/m                 Constitutional: Pt appears in NAD               HENT: Head: NCAT.  Right Ear: External ear normal.                 Left Ear: External ear normal.                Eyes: . Pupils are equal, round, and reactive to light. Conjunctivae and EOM are normal               Nose: without d/c or deformity               Neck: Neck supple. Gross normal ROM               Cardiovascular: Normal rate and regular rhythm.                 Pulmonary/Chest: Effort normal and breath sounds without rales or wheezing.                Abd:  Soft, NT, ND, + BS, no organomegaly               Neurological: Pt is alert. At baseline orientation, motor grossly intact               Skin: Skin is warm. No rashes, no other new lesions, LE edema - none               Psychiatric: Pt behavior is normal without agitation   Micro: none  Cardiac tracings I have personally interpreted today:  none  Pertinent Radiological findings (summarize): none   Lab Results  Component Value Date   WBC 10.4 07/03/2024   HGB 12.2 (L) 07/03/2024   HCT 35.9 (L) 07/03/2024   PLT 415.0 (H) 07/03/2024   GLUCOSE 87 07/03/2024   CHOL 152 07/03/2024   TRIG 307.0 (H) 07/03/2024   HDL 34.90 (L)  07/03/2024   LDLDIRECT 53.0 06/30/2023   LDLCALC 56 07/03/2024   ALT 18 07/03/2024   AST 26 07/03/2024   NA 139 07/03/2024   K 4.2 07/03/2024   CL 105 07/03/2024   CREATININE 1.43 07/03/2024   BUN 18 07/03/2024   CO2 24 07/03/2024   TSH 1.60 01/03/2024   PSA 2.02 01/03/2024   INR 0.89 09/25/2009   HGBA1C 6.3 07/03/2024   Assessment/Plan:  Clifford Orsak. is a 78 y.o. White or Caucasian [1] male with  has a past medical history of Chicken pox, Erectile dysfunction, Heart murmur, Herpes simplex, and HLD (hyperlipidemia) (06/16/2019).  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  Clifford Rush, MD 07/05/2024 12:12 PM [facility]

## 2024-07-05 NOTE — Assessment & Plan Note (Signed)
 BP Readings from Last 3 Encounters:  07/05/24 (!) 144/82  03/01/24 136/78  01/03/24 (!) 150/90   Uncontrolled but pt states controled at home, pt to continue medical treatment losartan  100 mg every day, declines other change for now

## 2024-07-05 NOTE — Assessment & Plan Note (Signed)
 Ckd3a  Lab Results  Component Value Date   CREATININE 1.43 07/03/2024   Stable overall, cont to avoid nephrotoxins

## 2024-07-05 NOTE — Patient Instructions (Signed)
 Please continue all other medications as before, and refills have been done if requested.  Please have the pharmacy call with any other refills you may need.  Please continue your efforts at being more active, low cholesterol diet, and weight control.  Please keep your appointments with your specialists as you may have planned  Your lab work was Good!  Please make an Appointment to return in 6 months, or sooner if needed, also with Lab Appointment for testing done 3-5 days before at the FIRST FLOOR Lab (so this is for TWO appointments - please see the scheduling desk as you leave)

## 2024-07-05 NOTE — Assessment & Plan Note (Signed)
 Last vitamin D  Lab Results  Component Value Date   VD25OH 34.13 07/03/2024   Low, to start oral replacement

## 2024-07-17 ENCOUNTER — Telehealth: Payer: Self-pay | Admitting: Internal Medicine

## 2024-07-17 NOTE — Telephone Encounter (Signed)
 Copied from CRM [id number]. Topic: Clinical - Medication Refill >> Jul 17, 2024 11:12 AM Sophia H wrote: Medication: atorvastatin  (LIPITOR) 10 MG tablet [employer] is requesting a 100 day supply   Has the patient contacted their pharmacy? Yes, needing updated supply.   This is the patient's preferred pharmacy:  [facility] [id number] [place], [place] - [address] [place] [address] Phone: [phone] Fax: [phone]  Is this the correct pharmacy for this prescription? Yes If no, delete pharmacy and type the correct one.   Has the prescription been filled recently? Yes  Is the patient out of the medication? Yes  Has the patient been seen for an appointment in the last year OR does the patient have an upcoming appointment? Yes, seen Nov 19.   Can we respond through MyChart? Yes  Agent: Please be advised that Rx refills may take up to 3 business days. We ask that you follow-up with your pharmacy.

## 2024-07-18 NOTE — Telephone Encounter (Signed)
Insurance is requesting a 100 day supply.

## 2024-07-20 MED ORDER — ATORVASTATIN CALCIUM 10 MG PO TABS: 10.0000 mg | ORAL_TABLET | Freq: Every day | ORAL | 1 refills | Status: AC

## 2024-07-20 NOTE — Telephone Encounter (Signed)
Requested Prescriptions  Pending Prescriptions Disp Refills  . atorvastatin (LIPITOR) 10 MG tablet 90 tablet 1    Sig: Take 1 tablet (10 mg total) by mouth daily.     There is no refill protocol information for this order     

## 2024-08-25 ENCOUNTER — Encounter: Payer: Self-pay | Admitting: *Deleted

## 2024-08-25 NOTE — Progress Notes (Signed)
 Clifford Gould.                                          MRN: [id number]   08/25/2024   The [facility] Quality Team Specialist reviewed this patient medical record for the purposes of chart review for care gap closure. The following were reviewed: chart review for care gap closure-controlling blood pressure.    [facility] Quality Team

## 2025-01-04 ENCOUNTER — Ambulatory Visit: Admitting: Nurse Practitioner

## 2025-02-05 ENCOUNTER — Ambulatory Visit
